# Patient Record
Sex: Female | Born: 1955 | Race: White | Hispanic: No | Marital: Married | Smoking: Current every day smoker
Health system: Southern US, Community
[De-identification: ages and names within clinical notes are randomized; demographics above are authoritative.]

## PROBLEM LIST (undated history)

## (undated) DIAGNOSIS — M542 Cervicalgia: Secondary | ICD-10-CM

## (undated) DIAGNOSIS — N189 Chronic kidney disease, unspecified: Secondary | ICD-10-CM

## (undated) DIAGNOSIS — Z91014 Allergy to mammalian meats: Secondary | ICD-10-CM

## (undated) DIAGNOSIS — K5792 Diverticulitis of intestine, part unspecified, without perforation or abscess without bleeding: Secondary | ICD-10-CM

## (undated) DIAGNOSIS — R011 Cardiac murmur, unspecified: Secondary | ICD-10-CM

## (undated) DIAGNOSIS — I10 Essential (primary) hypertension: Secondary | ICD-10-CM

## (undated) DIAGNOSIS — M545 Low back pain, unspecified: Secondary | ICD-10-CM

## (undated) DIAGNOSIS — K219 Gastro-esophageal reflux disease without esophagitis: Secondary | ICD-10-CM

## (undated) DIAGNOSIS — N200 Calculus of kidney: Secondary | ICD-10-CM

## (undated) DIAGNOSIS — E7800 Pure hypercholesterolemia, unspecified: Secondary | ICD-10-CM

## (undated) HISTORY — DX: Allergy to mammalian meats: Z91.014

## (undated) HISTORY — DX: Calculus of kidney: N20.0

## (undated) HISTORY — DX: Low back pain, unspecified: M54.50

## (undated) HISTORY — DX: Pure hypercholesterolemia, unspecified: E78.00

## (undated) HISTORY — DX: Cervicalgia: M54.2

## (undated) HISTORY — DX: Chronic kidney disease, unspecified: N18.9

## (undated) HISTORY — PX: CYSTOSCOPY: SUR368

## (undated) HISTORY — DX: Gastro-esophageal reflux disease without esophagitis: K21.9

## (undated) HISTORY — DX: Essential (primary) hypertension: I10

## (undated) HISTORY — PX: RECONSTRUCTION OF EYELID: SHX6576

## (undated) HISTORY — DX: Diverticulitis of intestine, part unspecified, without perforation or abscess without bleeding: K57.92

## (undated) HISTORY — PX: KNEE ARTHROSCOPY W/ MENISCAL REPAIR: SHX1877

## (undated) HISTORY — DX: Cardiac murmur, unspecified: R01.1

## (undated) HISTORY — DX: Low back pain: M54.5

---

## 1995-02-23 HISTORY — PX: VAGINAL HYSTERECTOMY: SUR661

## 2005-12-16 ENCOUNTER — Ambulatory Visit (HOSPITAL_BASED_OUTPATIENT_CLINIC_OR_DEPARTMENT_OTHER): Admission: RE | Admit: 2005-12-16 | Discharge: 2005-12-16 | Payer: Self-pay | Admitting: Urology

## 2011-03-08 ENCOUNTER — Other Ambulatory Visit (HOSPITAL_COMMUNITY)
Admission: RE | Admit: 2011-03-08 | Discharge: 2011-03-08 | Disposition: A | Discharge status: Home / Self Care | Payer: BC Managed Care – PPO | Source: Ambulatory Visit | Attending: Gynecology | Admitting: Gynecology

## 2011-03-08 ENCOUNTER — Encounter: Payer: Self-pay | Admitting: Gynecology

## 2011-03-08 ENCOUNTER — Ambulatory Visit (INDEPENDENT_AMBULATORY_CARE_PROVIDER_SITE_OTHER): Payer: BC Managed Care – PPO | Admitting: Gynecology

## 2011-03-08 VITALS — BP 120/88 | Ht 65.0 in | Wt 188.0 lb

## 2011-03-08 DIAGNOSIS — Z01419 Encounter for gynecological examination (general) (routine) without abnormal findings: Secondary | ICD-10-CM

## 2011-03-08 DIAGNOSIS — M62838 Other muscle spasm: Secondary | ICD-10-CM

## 2011-03-08 MED ORDER — ALPRAZOLAM 0.5 MG PO TABS: 0.5000 mg | ORAL_TABLET | Freq: Three times a day (TID) | ORAL | Status: AC | PRN

## 2011-03-08 MED ORDER — IBUPROFEN 800 MG PO TABS: 800.0000 mg | ORAL_TABLET | Freq: Three times a day (TID) | ORAL | Status: AC | PRN

## 2011-03-08 MED ORDER — CYCLOBENZAPRINE HCL 10 MG PO TABS: 10.0000 mg | ORAL_TABLET | Freq: Every evening | ORAL | Status: DC | PRN

## 2011-03-08 NOTE — Patient Instructions (Addendum)
Try Flexeril and Motrin as we discussed. If her neck pain continues I recommend seeing orthopedist for further evaluation. Assuming you continue well from a gynecologic standpoint follow up in one year.    Consider Stop Smoking.  Help is available at [facility] smoking cessation program @ www.[place].com or (903) 119-2259. OR 1-800-QUIT-NOW (479)025-7644) for free smoking cessation counseling.

## 2011-03-08 NOTE — Progress Notes (Addendum)
Whitney Ross [birth year] [id number]        56 y.o.  for annual exam.  Former patient of Dr. Angeline Slim says not been in several years due to lack of insurance. She is without gynecologic complaints. Does have occasional hot flushes. Had been on ERT previously but has discontinued this is done already off of it. 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  Past medical history,surgical history, medications, allergies, family history and social history were all reviewed and documented in the EPIC chart. ROS:  Was performed and pertinent positives and negatives are included in the history.  Exam: chaperone present Filed Vitals:   03/08/11 1426  BP: 120/88   General appearance  Normal Skin grossly normal Head/Neck normal with no cervical or supraclavicular adenopathy thyroid normal Lungs  clear Cardiac RR, without RMG Abdominal  soft, nontender, without masses, organomegaly or hernia Breasts  examined lying and sitting without masses, retractions, discharge or axillary adenopathy. Pelvic  Ext/BUS/vagina  normal Pap of cuff done  Bimanual exam normal  Anus and perineum  normal   Rectovaginal  normal sphincter tone without palpated masses or tenderness.    Assessment/Plan:  56 y.o. female for annual exam.    1. Mild menopausal symptoms. Options were reviewed and she's going to try OTC soy. I reviewed ERT with her but she's not interested she will follow up with me if her symptoms become worse and she wants to discuss ERT. 2. Neck muscle spasm. Patient's been having issues with spasms in her trapezius muscle on the left over the past several weeks. She has seen physical therapy as well as chiropractic. On exam she does have tension in her left trapezius no palpable masses but definitely more tense than her right recommended that she go with Flexeril 10 mg at bedtime #20 no refill and Motrin 800 mg every 8 hours when necessary pain #30 with one  refill. If her pain persists recommend she follow up with orthopedics to consider diagnostic studies rule out cervical disc disease and she agrees with this. 3. Anxiety. She does have some anxiety in her marriage and asked if I could prescribe Xanax to help her deal with this I prescribed Xanax 0.5 mg #30 with 1 refill. 4. Cigarette smoking. Stop smoking was discussed. 5. Pap smear. I did a Pap smear of her vaginal cuff today. She has not had one in several years and she is new to the practice I wanted to get a baseline Pap smear. I did discuss current screening guidelines that after hysterectomy for benign indications that you can stop doing Pap smears and we'll rediscuss this on an annual basis. 6. Mammography. She has not had mammogram in several years and agrees to schedule this in the importance of follow up. SBE monthly was reviewed. 7. Colonoscopy. The patient is in the process of arranging this and will do so. 8. Health maintenance. Patient being followed for hypertension and hypercholesterolemia. She'll continue to see her primary in reference to this and no blood work was done today as is all done through her primary's office.    Dara Lords MD, 3:09 PM 03/08/2011

## 2011-03-09 ENCOUNTER — Encounter: Payer: Self-pay | Admitting: Gynecology

## 2011-05-11 ENCOUNTER — Encounter: Payer: Self-pay | Admitting: Gynecology

## 2011-05-14 ENCOUNTER — Encounter: Payer: Self-pay | Admitting: Gastroenterology

## 2011-05-25 ENCOUNTER — Encounter: Payer: Self-pay | Admitting: Gastroenterology

## 2011-05-25 ENCOUNTER — Ambulatory Visit (AMBULATORY_SURGERY_CENTER): Payer: BC Managed Care – PPO | Admitting: *Deleted

## 2011-05-25 VITALS — Ht 65.0 in | Wt 184.0 lb

## 2011-05-25 DIAGNOSIS — Z1211 Encounter for screening for malignant neoplasm of colon: Secondary | ICD-10-CM

## 2011-05-25 MED ORDER — PEG-KCL-NACL-NASULF-NA ASC-C 100 G PO SOLR: ORAL | Status: DC

## 2011-05-25 NOTE — Progress Notes (Signed)
Pt knows to be here at 8:00 a.m. For her 9:00 a.m. procedure

## 2011-06-01 ENCOUNTER — Ambulatory Visit (AMBULATORY_SURGERY_CENTER): Payer: BC Managed Care – PPO | Admitting: Gastroenterology

## 2011-06-01 ENCOUNTER — Encounter: Payer: Self-pay | Admitting: Gastroenterology

## 2011-06-01 VITALS — BP 133/100 | HR 70 | Temp 98.1°F | Resp 19 | Ht 65.0 in | Wt 184.0 lb

## 2011-06-01 DIAGNOSIS — Z1211 Encounter for screening for malignant neoplasm of colon: Secondary | ICD-10-CM

## 2011-06-01 DIAGNOSIS — K648 Other hemorrhoids: Secondary | ICD-10-CM

## 2011-06-01 DIAGNOSIS — K5730 Diverticulosis of large intestine without perforation or abscess without bleeding: Secondary | ICD-10-CM

## 2011-06-01 MED ORDER — SODIUM CHLORIDE 0.9 % IV SOLN: 500.0000 mL | INTRAVENOUS | Status: DC

## 2011-06-01 NOTE — Op Note (Signed)
[facility] [address] [place], [place]  [address]  COLONOSCOPY PROCEDURE REPORT  PATIENT:  Whitney Ross, Whitney Ross  MR#:  [id number] BIRTHDATE:  12-Aug-1955, 55 yrs. old  GENDER:  female ENDOSCOPIST:  Rachael Fee, MD REF. BY:  Rudi Heap, M.D. PROCEDURE DATE:  06/01/2011 PROCEDURE:  Colonoscopy 78295 ASA CLASS:  Class II INDICATIONS:  Routine Risk Screening MEDICATIONS:   Fentanyl 75 mcg IV, These medications were titrated to patient response per physician's verbal order, Versed 9 mg IV  DESCRIPTION OF PROCEDURE:   After the risks benefits and alternatives of the procedure were thoroughly explained, informed consent was obtained.  Digital rectal exam was performed and revealed no rectal masses.   The LB [id number] endoscope was introduced through the anus and advanced to the cecum, which was identified by both the appendix and ileocecal valve, without limitations.  The quality of the prep was good..  The instrument was then slowly withdrawn as the colon was fully examined. <<PROCEDUREIMAGES>> FINDINGS:  Mild diverticulosis was found in the sigmoid to descending colon segments (see image1).  Internal Hemorrhoids were found.  This was otherwise a normal examination of the colon (see image2, image3, and image4).   Retroflexed views in the rectum revealed no abnormalities.   COMPLICATIONS:  None  ENDOSCOPIC IMPRESSION: 1) Mild diverticulosis in the sigmoid to descending colon segments 2) Internal hemorrhoids 3) Otherwise normal examination; no polyps or cancers  RECOMMENDATIONS: 1) You should continue to follow colorectal cancer screening guidelines for "routine risk" patients with a repeat colonoscopy in 10 years. There is no need for FOBT (stool) testing for at least 5 years.  REPEAT EXAM:  10 years  ______________________________ Rachael Fee, MD  n. eSIGNED:   Rachael Fee at 06/01/2011 09:13 AM  Genevie Cheshire, [phone]

## 2011-06-01 NOTE — Progress Notes (Signed)
Patient did not have preoperative order for IV antibiotic SSI prophylaxis. (G8918)  Patient did not experience any of the following events: a burn prior to discharge; a fall within the facility; wrong site/side/patient/procedure/implant event; or a hospital transfer or hospital admission upon discharge from the facility. (G8907)  

## 2011-06-01 NOTE — Patient Instructions (Addendum)
One of your biggest health concerns is your smoking.  This increases your risk for most cancers and serious cardiovascular diseases such as strokes, heart attacks.  You should try your best to stop.  If you need assistance, please contact your PCP or Smoking Cessation Class at [facility] [phone]) or Katie (1-800-QUIT-NOW).   YOU HAD AN ENDOSCOPIC PROCEDURE TODAY AT [facility]: Refer to the procedure report that was given to you for any specific questions about what was found during the examination.  If the procedure report does not answer your questions, please call your gastroenterologist to clarify.  If you requested that your care partner not be given the details of your procedure findings, then the procedure report has been included in a sealed envelope for you to review at your convenience later.  YOU SHOULD EXPECT: Some feelings of bloating in the abdomen. Passage of more gas than usual.  Walking can help get rid of the air that was put into your GI tract during the procedure and reduce the bloating. If you had a lower endoscopy (such as a colonoscopy or flexible sigmoidoscopy) you may notice spotting of blood in your stool or on the toilet paper. If you underwent a bowel prep for your procedure, then you may not have a normal bowel movement for a few days.  DIET: Your first meal following the procedure should be a light meal and then it is ok to progress to your normal diet.  A half-sandwich or bowl of soup is an example of a good first meal.  Heavy or fried foods are harder to digest and may make you feel nauseous or bloated.  Likewise meals heavy in dairy and vegetables can cause extra gas to form and this can also increase the bloating.  Drink plenty of fluids but you should avoid alcoholic beverages for 24 hours.  ACTIVITY: Your care partner should take you home directly after the procedure.  You should plan to take it easy, moving slowly for the rest of  the day.  You can resume normal activity the day after the procedure however you should NOT DRIVE or use heavy machinery for 24 hours (because of the sedation medicines used during the test).    SYMPTOMS TO REPORT IMMEDIATELY: A gastroenterologist can be reached at any hour.  During normal business hours, 8:30 AM to 5:00 PM Monday through Friday, call [phone].  After hours and on weekends, please call the GI answering service at [phone] who will take a message and have the physician on call contact you.   Following lower endoscopy (colonoscopy or flexible sigmoidoscopy):  Excessive amounts of blood in the stool  Significant tenderness or worsening of abdominal pains  Swelling of the abdomen that is new, acute  Fever of 100F or higher    FOLLOW UP: If any biopsies were taken you will be contacted by phone or by letter within the next 1-3 weeks.  Call your gastroenterologist if you have not heard about the biopsies in 3 weeks.  Our staff will call the home number listed on your records the next business day following your procedure to check on you and address any questions or concerns that you may have at that time regarding the information given to you following your procedure. This is a courtesy call and so if there is no answer at the home number and we have not heard from you through the emergency physician on call, we will assume that you  have returned to your regular daily activities without incident.  SIGNATURES/CONFIDENTIALITY: You and/or your care partner have signed paperwork which will be entered into your electronic medical record.  These signatures attest to the fact that that the information above on your After Visit Summary has been reviewed and is understood.  Full responsibility of the confidentiality of this discharge information lies with you and/or your care-partner.   INFORMATION ON DIVERTICULOSIS ,HEMORRHOIDS, & HIGH FIBER DIET GIVEN TO YOU TODAY

## 2011-06-02 ENCOUNTER — Telehealth: Payer: Self-pay

## 2011-06-02 NOTE — Telephone Encounter (Signed)
  Follow up Call-  Call back number 06/01/2011  Post procedure Call Back phone  # [phone] cell  Permission to leave phone message Yes     Patient questions:  Do you have a fever, pain , or abdominal swelling? no Pain Score  0 *  Have you tolerated food without any problems? yes  Have you been able to return to your normal activities? yes  Do you have any questions about your discharge instructions: Diet   no Medications  no Follow up visit  no  Do you have questions or concerns about your Care? no  Actions: * If pain score is 4 or above: No action needed, pain <4.

## 2011-06-08 ENCOUNTER — Other Ambulatory Visit: Payer: BC Managed Care – PPO | Admitting: Gastroenterology

## 2011-11-30 ENCOUNTER — Ambulatory Visit
Admission: RE | Admit: 2011-11-30 | Discharge: 2011-11-30 | Disposition: A | Discharge status: Home / Self Care | Payer: BC Managed Care – PPO | Source: Ambulatory Visit | Attending: Family Medicine | Admitting: Family Medicine

## 2011-11-30 ENCOUNTER — Other Ambulatory Visit: Payer: BC Managed Care – PPO

## 2011-11-30 ENCOUNTER — Other Ambulatory Visit: Payer: Self-pay | Admitting: Family Medicine

## 2011-11-30 DIAGNOSIS — N23 Unspecified renal colic: Secondary | ICD-10-CM

## 2011-11-30 DIAGNOSIS — R109 Unspecified abdominal pain: Secondary | ICD-10-CM

## 2011-12-21 ENCOUNTER — Other Ambulatory Visit: Payer: Self-pay | Admitting: Urology

## 2011-12-21 DIAGNOSIS — R109 Unspecified abdominal pain: Secondary | ICD-10-CM

## 2011-12-22 ENCOUNTER — Other Ambulatory Visit: Payer: BC Managed Care – PPO

## 2011-12-28 ENCOUNTER — Ambulatory Visit
Admission: RE | Admit: 2011-12-28 | Discharge: 2011-12-28 | Disposition: A | Discharge status: Home / Self Care | Payer: BC Managed Care – PPO | Source: Ambulatory Visit | Attending: Urology | Admitting: Urology

## 2011-12-28 DIAGNOSIS — R109 Unspecified abdominal pain: Secondary | ICD-10-CM

## 2012-01-07 ENCOUNTER — Other Ambulatory Visit: Payer: Self-pay | Admitting: Family Medicine

## 2012-01-07 DIAGNOSIS — R1011 Right upper quadrant pain: Secondary | ICD-10-CM

## 2012-01-13 ENCOUNTER — Encounter (HOSPITAL_COMMUNITY): Payer: BC Managed Care – PPO

## 2012-01-18 ENCOUNTER — Encounter (HOSPITAL_COMMUNITY)
Admission: RE | Admit: 2012-01-18 | Discharge: 2012-01-18 | Disposition: A | Discharge status: Home / Self Care | Payer: BC Managed Care – PPO | Source: Ambulatory Visit | Attending: Family Medicine | Admitting: Family Medicine

## 2012-01-18 ENCOUNTER — Encounter (HOSPITAL_COMMUNITY): Payer: Self-pay

## 2012-01-18 DIAGNOSIS — R109 Unspecified abdominal pain: Secondary | ICD-10-CM | POA: Insufficient documentation

## 2012-01-18 DIAGNOSIS — R1011 Right upper quadrant pain: Secondary | ICD-10-CM

## 2012-01-18 MED ORDER — TECHNETIUM TC 99M MEBROFENIN IV KIT
5.0000 | PACK | Freq: Once | INTRAVENOUS | Status: AC | PRN
  Administered 2012-01-18: 5.2 via INTRAVENOUS

## 2012-05-18 ENCOUNTER — Encounter: Payer: Self-pay | Admitting: Family Medicine

## 2012-05-18 ENCOUNTER — Encounter: Payer: Self-pay | Admitting: *Deleted

## 2012-05-19 ENCOUNTER — Encounter: Payer: Self-pay | Admitting: Family Medicine

## 2012-05-19 ENCOUNTER — Ambulatory Visit (INDEPENDENT_AMBULATORY_CARE_PROVIDER_SITE_OTHER): Payer: Self-pay | Admitting: Family Medicine

## 2012-05-19 VITALS — BP 151/91 | HR 67 | Temp 97.2°F | Ht 65.0 in | Wt 198.8 lb

## 2012-05-19 DIAGNOSIS — F439 Reaction to severe stress, unspecified: Secondary | ICD-10-CM | POA: Insufficient documentation

## 2012-05-19 DIAGNOSIS — J310 Chronic rhinitis: Secondary | ICD-10-CM | POA: Insufficient documentation

## 2012-05-19 DIAGNOSIS — J019 Acute sinusitis, unspecified: Secondary | ICD-10-CM | POA: Insufficient documentation

## 2012-05-19 DIAGNOSIS — Z639 Problem related to primary support group, unspecified: Secondary | ICD-10-CM

## 2012-05-19 MED ORDER — FLUTICASONE PROPIONATE 50 MCG/ACT NA SUSP: 2.0000 | Freq: Every day | NASAL | Status: DC

## 2012-05-19 MED ORDER — AMOXICILLIN 875 MG PO TABS: 875.0000 mg | ORAL_TABLET | Freq: Two times a day (BID) | ORAL | Status: DC

## 2012-05-19 MED ORDER — AZELASTINE HCL 0.15 % NA SOLN: 2.0000 | Freq: Two times a day (BID) | NASAL | Status: DC

## 2012-05-19 NOTE — Patient Instructions (Signed)
Allergic Rhinitis Allergic rhinitis is when the mucous membranes in the nose respond to allergens. Allergens are particles in the air that cause your body to have an allergic reaction. This causes you to release allergic antibodies. Through a chain of events, these eventually cause you to release histamine into the blood stream (hence the use of antihistamines). Although meant to be protective to the body, it is this release that causes your discomfort, such as frequent sneezing, congestion and an itchy runny nose.  CAUSES  The pollen allergens may come from grasses, trees, and weeds. This is seasonal allergic rhinitis, or "hay fever." Other allergens cause year-round allergic rhinitis (perennial allergic rhinitis) such as house dust mite allergen, pet dander and mold spores.  SYMPTOMS   Nasal stuffiness (congestion).  Runny, itchy nose with sneezing and tearing of the eyes.  There is often an itching of the mouth, eyes and ears. It cannot be cured, but it can be controlled with medications. DIAGNOSIS  If you are unable to determine the offending allergen, skin or blood testing may find it. TREATMENT   Avoid the allergen.  Medications and allergy shots (immunotherapy) can help.  Hay fever may often be treated with antihistamines in pill or nasal spray forms. Antihistamines block the effects of histamine. There are over-the-counter medicines that may help with nasal congestion and swelling around the eyes. Check with your caregiver before taking or giving this medicine. If the treatment above does not work, there are many new medications your caregiver can prescribe. Stronger medications may be used if initial measures are ineffective. Desensitizing injections can be used if medications and avoidance fails. Desensitization is when a patient is given ongoing shots until the body becomes less sensitive to the allergen. Make sure you follow up with your caregiver if problems continue. SEEK MEDICAL  CARE IF:   You develop fever (more than 100.5 F (38.1 C).  You develop a cough that does not stop easily (persistent).  You have shortness of breath.  You start wheezing.  Symptoms interfere with normal daily activities. Document Released: 11/03/2000 Document Revised: 05/03/2011 Document Reviewed: 05/15/2008 [facility] Patient Information 2013 [place], [place]. Smoking Cessation Quitting smoking is important to your health and has many advantages. However, it is not always easy to quit since nicotine is a very addictive drug. Often times, people try 3 times or more before being able to quit. This document explains the best ways for you to prepare to quit smoking. Quitting takes hard work and a lot of effort, but you can do it. ADVANTAGES OF QUITTING SMOKING  You will live longer, feel better, and live better.  Your body will feel the impact of quitting smoking almost immediately.  Within 20 minutes, blood pressure decreases. Your pulse returns to its normal level.  After 8 hours, carbon monoxide levels in the blood return to normal. Your oxygen level increases.  After 24 hours, the chance of having a heart attack starts to decrease. Your breath, hair, and body stop smelling like smoke.  After 48 hours, damaged nerve endings begin to recover. Your sense of taste and smell improve.  After 72 hours, the body is virtually free of nicotine. Your bronchial tubes relax and breathing becomes easier.  After 2 to 12 weeks, lungs can hold more air. Exercise becomes easier and circulation improves.  The risk of having a heart attack, stroke, cancer, or lung disease is greatly reduced.  After 1 year, the risk of coronary heart disease is cut in half.  After 5 years, the risk of stroke falls to the same as a nonsmoker.  After 10 years, the risk of lung cancer is cut in half and the risk of other cancers decreases significantly.  After 15 years, the risk of coronary heart disease drops,  usually to the level of a nonsmoker.  If you are pregnant, quitting smoking will improve your chances of having a healthy baby.  The people you live with, especially any children, will be healthier.  You will have extra money to spend on things other than cigarettes. QUESTIONS TO THINK ABOUT BEFORE ATTEMPTING TO QUIT You may want to talk about your answers with your caregiver.  Why do you want to quit?  If you tried to quit in the past, what helped and what did not?  What will be the most difficult situations for you after you quit? How will you plan to handle them?  Who can help you through the tough times? Your family? Friends? A caregiver?  What pleasures do you get from smoking? What ways can you still get pleasure if you quit? Here are some questions to ask your caregiver:  How can you help me to be successful at quitting?  What medicine do you think would be best for me and how should I take it?  What should I do if I need more help?  What is smoking withdrawal like? How can I get information on withdrawal? GET READY  Set a quit date.  Change your environment by getting rid of all cigarettes, ashtrays, matches, and lighters in your home, car, or work. Do not let people smoke in your home.  Review your past attempts to quit. Think about what worked and what did not. GET SUPPORT AND ENCOURAGEMENT You have a better chance of being successful if you have help. You can get support in many ways.  Tell your family, friends, and co-workers that you are going to quit and need their support. Ask them not to smoke around you.  Get individual, group, or telephone counseling and support. Programs are available at [employer] and health centers. Call your local health department for information about programs in your area.  Spiritual beliefs and practices may help some smokers quit.  Download a "quit meter" on your computer to keep track of quit statistics, such as how long  you have gone without smoking, cigarettes not smoked, and money saved.  Get a self-help book about quitting smoking and staying off of tobacco. LEARN NEW SKILLS AND BEHAVIORS  Distract yourself from urges to smoke. Talk to someone, go for a walk, or occupy your time with a task.  Change your normal routine. Take a different route to work. Drink tea instead of coffee. Eat breakfast in a different place.  Reduce your stress. Take a hot bath, exercise, or read a book.  Plan something enjoyable to do every day. Reward yourself for not smoking.  Explore interactive web-based programs that specialize in helping you quit. GET MEDICINE AND USE IT CORRECTLY Medicines can help you stop smoking and decrease the urge to smoke. Combining medicine with the above behavioral methods and support can greatly increase your chances of successfully quitting smoking.  Nicotine replacement therapy helps deliver nicotine to your body without the negative effects and risks of smoking. Nicotine replacement therapy includes nicotine gum, lozenges, inhalers, nasal sprays, and skin patches. Some may be available over-the-counter and others require a prescription.  Antidepressant medicine helps people abstain from smoking, but how this  works is unknown. This medicine is available by prescription.  Nicotinic receptor partial agonist medicine simulates the effect of nicotine in your brain. This medicine is available by prescription. Ask your caregiver for advice about which medicines to use and how to use them based on your health history. Your caregiver will tell you what side effects to look out for if you choose to be on a medicine or therapy. Carefully read the information on the package. Do not use any other product containing nicotine while using a nicotine replacement product.  RELAPSE OR DIFFICULT SITUATIONS Most relapses occur within the first 3 months after quitting. Do not be discouraged if you start smoking  again. Remember, most people try several times before finally quitting. You may have symptoms of withdrawal because your body is used to nicotine. You may crave cigarettes, be irritable, feel very hungry, cough often, get headaches, or have difficulty concentrating. The withdrawal symptoms are only temporary. They are strongest when you first quit, but they will go away within 10 14 days. To reduce the chances of relapse, try to:  Avoid drinking alcohol. Drinking lowers your chances of successfully quitting.  Reduce the amount of caffeine you consume. Once you quit smoking, the amount of caffeine in your body increases and can give you symptoms, such as a rapid heartbeat, sweating, and anxiety.  Avoid smokers because they can make you want to smoke.  Do not let weight gain distract you. Many smokers will gain weight when they quit, usually less than 10 pounds. Eat a healthy diet and stay active. You can always lose the weight gained after you quit.  Find ways to improve your mood other than smoking. FOR MORE INFORMATION  www.smokefree.gov  Document Released: 02/02/2001 Document Revised: 08/10/2011 Document Reviewed: 05/20/2011 [facility] Patient Information 2013 [place], [place]. Stress Stress-related medical problems are becoming increasingly common. The body has a built-in physical response to stressful situations. Faced with pressure, challenge or danger, we need to react quickly. Our bodies release hormones such as cortisol and adrenaline to help do this. These hormones are part of the "fight or flight" response and affect the metabolic rate, heart rate and blood pressure, resulting in a heightened, stressed state that prepares the body for optimum performance in dealing with a stressful situation. It is likely that early man required these mechanisms to stay alive, but usually modern stresses do not call for this, and the same hormones released in today's world can damage health and reduce  coping ability. CAUSES  Pressure to perform at work, at school or in sports.  Threats of physical violence.  Money worries.  Arguments.  Family conflicts.  Divorce or separation from significant other.  Bereavement.  New job or unemployment.  Changes in location.  Alcohol or drug abuse. SOMETIMES, THERE IS NO PARTICULAR REASON FOR DEVELOPING STRESS. Almost all people are at risk of being stressed at some time in their lives. It is important to know that some stress is temporary and some is long term.  Temporary stress will go away when a situation is resolved. Most people can cope with short periods of stress, and it can often be relieved by relaxing, taking a walk, chatting through issues with friends, or having a good night's sleep.  Chronic (long-term, continuous) stress is much harder to deal with. It can be psychologically and emotionally damaging. It can be harmful both for an individual and for friends and family. SYMPTOMS Everyone reacts to stress differently. There are some common effects that  help [place] recognize it. In times of extreme stress, people may:  Shake uncontrollably.  Breathe faster and deeper than normal (hyperventilate).  Vomit.  For people with asthma, stress can trigger an attack.  For some people, stress may trigger migraine headaches, ulcers, and body pain. PHYSICAL EFFECTS OF STRESS MAY INCLUDE:  Loss of energy.  Skin problems.  Aches and pains resulting from tense muscles, including neck ache, backache and tension headaches.  Increased pain from arthritis and other conditions.  Irregular heart beat (palpitations).  Periods of irritability or anger.  Apathy or depression.  Anxiety (feeling uptight or worrying).  Unusual behavior.  Loss of appetite.  Comfort eating.  Lack of concentration.  Loss of, or decreased, sex-drive.  Increased smoking, drinking, or recreational drug use.  For women, missed periods.  Ulcers, joint  pain, and muscle pain. Post-traumatic stress is the stress caused by any serious accident, strong emotional damage, or extremely difficult or violent experience such as rape or war. Post-traumatic stress victims can experience mixtures of emotions such as fear, shame, depression, guilt or anger. It may include recurrent memories or images that may be haunting. These feelings can last for weeks, months or even years after the traumatic event that triggered them. Specialized treatment, possibly with medicines and psychological therapies, is available. If stress is causing physical symptoms, severe distress or making it difficult for you to function as normal, it is worth seeing your caregiver. It is important to remember that although stress is a usual part of life, extreme or prolonged stress can lead to other illnesses that will need treatment. It is better to visit a doctor sooner rather than later. Stress has been linked to the development of high blood pressure and heart disease, as well as insomnia and depression. There is no diagnostic test for stress since everyone reacts to it differently. But a caregiver will be able to spot the physical symptoms, such as:  Headaches.  Shingles.  Ulcers. Emotional distress such as intense worry, low mood or irritability should be detected when the doctor asks pertinent questions to identify any underlying problems that might be the cause. In case there are physical reasons for the symptoms, the doctor may also want to do some tests to exclude certain conditions. If you feel that you are suffering from stress, try to identify the aspects of your life that are causing it. Sometimes you may not be able to change or avoid them, but even a small change can have a positive ripple effect. A simple lifestyle change can make all the difference. STRATEGIES THAT CAN HELP DEAL WITH STRESS:  Delegating or sharing responsibilities.  Avoiding confrontations.  Learning to  be more assertive.  Regular exercise.  Avoid using alcohol or street drugs to cope.  Eating a healthy, balanced diet, rich in fruit and vegetables and proteins.  Finding humor or absurdity in stressful situations.  Never taking on more than you know you can handle comfortably.  Organizing your time better to get as much done as possible.  Talking to friends or family and sharing your thoughts and fears.  Listening to music or relaxation tapes.  Tensing and then relaxing your muscles, starting at the toes and working up to the head and neck. If you think that you would benefit from help, either in identifying the things that are causing your stress or in learning techniques to help you relax, see a caregiver who is capable of helping you with this. Rather than relying on medications,  it is usually better to try and identify the things in your life that are causing stress and try to deal with them. There are many techniques of managing stress including counseling, psychotherapy, aromatherapy, yoga, and exercise. Your caregiver can help you determine what is best for you. Document Released: 05/01/2002 Document Revised: 05/03/2011 Document Reviewed: 03/28/2007 [facility] Patient Information 2013 [place], [place].

## 2012-05-19 NOTE — Progress Notes (Signed)
Patient ID: Whitney Ross, female   DOB: [birth year], 57 y.o.   MRN: [id number] SUBJECTIVE:   HPI: 57 year old woman comes in today with an exacerbation of nasal congestion. Pressure and pain in the maxillary sinus area. No fever no chills. No shortness of breath. She does smoke a pack a day. No phlegm. No hemoptysis. No wheezing. She's not on her nasal sprays. She is also under tremendous stress with her 55 year old son. Lives at home and is not working. And she believes he needs to see a therapist.  PMH/PSH:reviewed/updated in Epic SH/FH:reviewed/updated in Epic Meds:reviewed/updated in Epic Allergies:reviewed/updated in Epic Immunizations:reviewed/updated in Epic ROS: as above in HPI. Other systems are negative or stable today.  OBJECTIVE:   On examination she appeared in good health and spirits. A bit stressed.  Well developed, well nourished. Vital signs as documented. BP 151/91  Pulse 67  Temp(Src) 97.2 F (36.2 C) (Oral)  Ht 5\' 5"  (1.651 m)  Wt 198 lb 12.8 oz (90.175 kg)  BMI 33.08 kg/m2  Skin warm and dry and without overt rashes. Head & Neck without JVD. Nasal congestion with thick yellow coryza. Maxillary sinuses tender mildly. Frontal sinuses nontender the rest of the head and neck was normal Lungs clear.  Heart exam notable for regular rhythm, normal sounds and absence of murmurs, rubs or gallops. Abdomen unremarkable and without evidence of organomegaly, masses, or abdominal aortic enlargement.  Breast exam: not performed. Gyn Exam: Not performed. External Genitalia: Vagina:: Cervix: Uterus: Adnexae: R/V: Extremities nonedematous. Neurologic: oriented to time, place and person. Nonfocal exam. Patient was not suicidal or hallucinating. She was worried about her son and we discussed this today.  ASSESSMENT:  Purulent rhinitis - Plan: Azelastine HCl (ASTEPRO) 0.15 % SOLN, fluticasone (FLONASE) 50 MCG/ACT nasal spray, amoxicillin (AMOXIL) 875 MG  tablet  Sinusitis, acute - Plan: amoxicillin (AMOXIL) 875 MG tablet  Stress at home   PLAN:  Stress reduction. Recommended therapist Whitney Ross for her son.Also, [facility]. However she should check with insurance plan.. Meds ordered this encounter  Medications  . omeprazole (PRILOSEC) 40 MG capsule    Sig: Take 40 mg by mouth daily.  . Vitamin D, Ergocalciferol, (DRISDOL) 50000 UNITS CAPS    Sig: Take 50,000 Units by mouth once a week.  Marland Kitchen DISCONTD: fluticasone (FLONASE) 50 MCG/ACT nasal spray    Sig: Place 2 sprays into the nose daily.  Marland Kitchen DISCONTD: Azelastine HCl (ASTEPRO) 0.15 % SOLN    Sig: Place 1 spray into the nose 2 (two) times daily.  Marland Kitchen aspirin 81 MG chewable tablet    Sig: Chew 81 mg by mouth daily.  . Azelastine HCl (ASTEPRO) 0.15 % SOLN    Sig: Place 2 sprays into the nose 2 (two) times daily.    Dispense:  1 mL    Refill:  2  . fluticasone (FLONASE) 50 MCG/ACT nasal spray    Sig: Place 2 sprays into the nose daily.    Dispense:  1 g    Refill:  2  . amoxicillin (AMOXIL) 875 MG tablet    Sig: Take 1 tablet (875 mg total) by mouth 2 (two) times daily.    Dispense:  20 tablet    Refill:  0   No results found for this or any previous visit. Smoking cessation. Patient information in aVs. RTC as planned or prn.  Whitney Ross, M.D.

## 2012-06-21 ENCOUNTER — Other Ambulatory Visit: Payer: Self-pay | Admitting: *Deleted

## 2012-06-21 MED ORDER — LISINOPRIL 20 MG PO TABS: 20.0000 mg | ORAL_TABLET | Freq: Every day | ORAL | Status: DC

## 2012-06-21 MED ORDER — ATORVASTATIN CALCIUM 20 MG PO TABS: 20.0000 mg | ORAL_TABLET | Freq: Every day | ORAL | Status: DC

## 2012-07-26 ENCOUNTER — Encounter: Payer: Self-pay | Admitting: Gynecology

## 2012-07-28 ENCOUNTER — Encounter: Payer: Self-pay | Admitting: Family Medicine

## 2012-07-28 ENCOUNTER — Ambulatory Visit (INDEPENDENT_AMBULATORY_CARE_PROVIDER_SITE_OTHER): Payer: BC Managed Care – PPO | Admitting: Family Medicine

## 2012-07-28 VITALS — BP 137/89 | HR 54 | Temp 97.5°F | Wt 200.6 lb

## 2012-07-28 DIAGNOSIS — E785 Hyperlipidemia, unspecified: Secondary | ICD-10-CM

## 2012-07-28 DIAGNOSIS — K219 Gastro-esophageal reflux disease without esophagitis: Secondary | ICD-10-CM

## 2012-07-28 DIAGNOSIS — I10 Essential (primary) hypertension: Secondary | ICD-10-CM

## 2012-07-28 DIAGNOSIS — E559 Vitamin D deficiency, unspecified: Secondary | ICD-10-CM

## 2012-07-28 DIAGNOSIS — F17200 Nicotine dependence, unspecified, uncomplicated: Secondary | ICD-10-CM

## 2012-07-28 LAB — COMPLETE METABOLIC PANEL WITH GFR
ALT: 15 U/L (ref 0–35)
AST: 18 U/L (ref 0–37)
Albumin: 4.1 g/dL (ref 3.5–5.2)
Alkaline Phosphatase: 63 U/L (ref 39–117)
BUN: 11 mg/dL (ref 6–23)
CO2: 29 mEq/L (ref 19–32)
Calcium: 9 mg/dL (ref 8.4–10.5)
Chloride: 105 mEq/L (ref 96–112)
Creat: 0.65 mg/dL (ref 0.50–1.10)
GFR, Est African American: >89 mL/min
GFR, Est Non African American: >89 mL/min
Glucose, Bld: 90 mg/dL (ref 70–99)
Potassium: 4.4 mEq/L (ref 3.5–5.3)
Sodium: 139 mEq/L (ref 135–145)
Total Bilirubin: 0.6 mg/dL (ref 0.3–1.2)
Total Protein: 6.5 g/dL (ref 6.0–8.3)

## 2012-07-28 MED ORDER — OMEPRAZOLE 40 MG PO CPDR: 40.0000 mg | DELAYED_RELEASE_CAPSULE | Freq: Every day | ORAL | Status: DC

## 2012-07-28 MED ORDER — LISINOPRIL-HYDROCHLOROTHIAZIDE 20-12.5 MG PO TABS: 1.0000 | ORAL_TABLET | Freq: Every day | ORAL | Status: DC

## 2012-07-28 NOTE — Progress Notes (Signed)
Patient ID: Whitney Ross, female   DOB: [birth year], 57 y.o.   MRN: [id number] SUBJECTIVE: Chief Complaint  Patient presents with  . Follow-up    3 month follow up   HPI: Patient is here for follow up of hyperlipidemia/hypertension/stress/tobacco use/gerd: Stress is ongoing.  denies Headache;denies Chest Pain;denies weakness;denies Shortness of Breath and orthopnea;denies Visual changes;denies palpitations;denies cough;denies pedal edema;denies symptoms of TIA or stroke;deniesClaudication symptoms. admits to Compliance with medications; denies Problems with medications.  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  PMH/PSH: reviewed/updated in Epic  SH/FH: reviewed/updated in Epic  Allergies: reviewed/updated in Epic  Medications: reviewed/updated in Epic  Immunizations: reviewed/updated in Epic  ROS: As above in the HPI. All other systems are stable or negative.  OBJECTIVE: APPEARANCE:  Patient in no acute distress.The patient appeared well nourished and normally developed. Acyanotic. Waist: VITAL SIGNS:BP 137/89  Pulse 54  Temp(Src) 97.5 F (36.4 C) (Oral)  Wt 200 lb 9.6 oz (90.992 kg)  BMI 33.38 kg/m2 [name]  SKIN: warm and  Dry without overt rashes, tattoos and scars  HEAD and Neck: without JVD, Head and scalp: normal Eyes:No scleral icterus. Fundi normal, eye movements normal. Ears: Auricle normal, canal normal, Tympanic membranes normal, insufflation normal. Nose: normal Throat: normal Neck & thyroid: normal  CHEST & LUNGS: Chest wall: normal Lungs: Clear  CVS: Reveals the PMI to be normally located. Regular rhythm, First and Second Heart sounds are normal,  absence of murmurs, rubs or gallops. Peripheral vasculature: Radial pulses: normal Dorsal pedis pulses: normal Posterior pulses: normal  ABDOMEN:  Appearance: normal Benign, no organomegaly, no masses, no  Abdominal Aortic enlargement. No Guarding , no rebound. No Bruits. Bowel sounds: normal  RECTAL: N/A GU: N/A  EXTREMETIES: nonedematous. Both Femoral and Pedal pulses are normal.  MUSCULOSKELETAL:  Spine: normal Joints: intact  NEUROLOGIC: oriented to time,place and person; nonfocal. Strength is normal Sensory is normal Reflexes are normal Cranial Nerves are normal.  ASSESSMENT: HTN (hypertension) - Plan: lisinopril-hydrochlorothiazide (ZESTORETIC) 20-12.5 MG per tablet, COMPLETE METABOLIC PANEL WITH GFR  HLD (hyperlipidemia) - Plan: COMPLETE METABOLIC PANEL WITH GFR, NMR Lipoprofile with Lipids  GERD (gastroesophageal reflux disease) - Plan: omeprazole (PRILOSEC) 40 MG capsule  Unspecified vitamin D deficiency - Plan: Vitamin D 25 hydroxy  Tobacco use disorder  PLAN: Orders Placed This Encounter  Procedures  . COMPLETE METABOLIC PANEL WITH GFR  . NMR Lipoprofile with Lipids  . Vitamin D 25 hydroxy   No results found for this or any previous visit. Meds ordered this encounter  Medications  . lisinopril-hydrochlorothiazide (ZESTORETIC) 20-12.5 MG per tablet    Sig: Take 1 tablet by mouth daily.    Dispense:  90 tablet    Refill:  3  . omeprazole (PRILOSEC) 40 MG capsule    Sig: Take 1 capsule (40 mg total) by mouth daily.    Dispense:  30 capsule    Refill:  5        Dr Woodroe Mode Recommendations  Diet and Exercise discussed with patient.  For nutrition information, I recommend books:  1).Eat to Live by Dr Monico Hoar. 2).Prevent and Reverse Heart Disease by Dr Suzzette Righter. 3) Dr Katherina Right Book: Reversing Diabetes  Exercise recommendations are:  If unable to walk, then the patient can exercise in a chair 3 times a day. By flapping arms like a bird gently and raising legs outwards to the front.  If  ambulatory, the patient can go for walks for 30 minutes 3 times a week. Then increase the intensity and duration as tolerated.  Goal is to  try to attain exercise frequency to 5 times a week.  If applicable: Best to perform resistance exercises (machines or weights) 2 days a week and cardio type exercises 3 days per week.  Handout on smoking cessation given in the AVS.  counselled on smoking cessation.  Return in about 3 months (around 10/28/2012) for Recheck medical problems.  Liliann File P. [place] Charon, M.D.

## 2012-07-28 NOTE — Patient Instructions (Addendum)
Dr Woodroe Mode Recommendations  Diet and Exercise discussed with patient.  For nutrition information, I recommend books:  1).Eat to Live by Dr Monico Hoar. 2).Prevent and Reverse Heart Disease by Dr Suzzette Righter. 3) Dr Katherina Right Book: Reversing Diabetes  Exercise recommendations are:  If unable to walk, then the patient can exercise in a chair 3 times a day. By flapping arms like a bird gently and raising legs outwards to the front.  If ambulatory, the patient can go for walks for 30 minutes 3 times a week. Then increase the intensity and duration as tolerated.  Goal is to try to attain exercise frequency to 5 times a week.  If applicable: Best to perform resistance exercises (machines or weights) 2 days a week and cardio type exercises 3 days per week.   Smoking Cessation Quitting smoking is important to your health and has many advantages. However, it is not always easy to quit since nicotine is a very addictive drug. Often times, people try 3 times or more before being able to quit. This document explains the best ways for you to prepare to quit smoking. Quitting takes hard work and a lot of effort, but you can do it. ADVANTAGES OF QUITTING SMOKING  You will live longer, feel better, and live better.  Your body will feel the impact of quitting smoking almost immediately.  Within 20 minutes, blood pressure decreases. Your pulse returns to its normal level.  After 8 hours, carbon monoxide levels in the blood return to normal. Your oxygen level increases.  After 24 hours, the chance of having a heart attack starts to decrease. Your breath, hair, and body stop smelling like smoke.  After 48 hours, damaged nerve endings begin to recover. Your sense of taste and smell improve.  After 72 hours, the body is virtually free of nicotine. Your bronchial tubes relax and breathing becomes easier.  After 2 to 12 weeks, lungs can hold more air. Exercise becomes easier  and circulation improves.  The risk of having a heart attack, stroke, cancer, or lung disease is greatly reduced.  After 1 year, the risk of coronary heart disease is cut in half.  After 5 years, the risk of stroke falls to the same as a nonsmoker.  After 10 years, the risk of lung cancer is cut in half and the risk of other cancers decreases significantly.  After 15 years, the risk of coronary heart disease drops, usually to the level of a nonsmoker.  If you are pregnant, quitting smoking will improve your chances of having a healthy baby.  The people you live with, especially any children, will be healthier.  You will have extra money to spend on things other than cigarettes. QUESTIONS TO THINK ABOUT BEFORE ATTEMPTING TO QUIT You may want to talk about your answers with your caregiver.  Why do you want to quit?  If you tried to quit in the past, what helped and what did not?  What will be the most difficult situations for you after you quit? How will you plan to handle them?  Who can help you through the tough times? Your family? Friends? A caregiver?  What pleasures do you get from smoking? What ways can you still get pleasure if you quit? Here are some questions to ask your caregiver:  How can you help me to be successful at quitting?  What medicine do you think would be best for me and how should I take it?  What should I do if I need more help?  What is smoking withdrawal like? How can I get information on withdrawal? GET READY  Set a quit date.  Change your environment by getting rid of all cigarettes, ashtrays, matches, and lighters in your home, car, or work. Do not let people smoke in your home.  Review your past attempts to quit. Think about what worked and what did not. GET SUPPORT AND ENCOURAGEMENT You have a better chance of being successful if you have help. You can get support in many ways.  Tell your family, friends, and co-workers that you are going  to quit and need their support. Ask them not to smoke around you.  Get individual, group, or telephone counseling and support. Programs are available at [employer] and health centers. Call your local health department for information about programs in your area.  Spiritual beliefs and practices may help some smokers quit.  Download a "quit meter" on your computer to keep track of quit statistics, such as how long you have gone without smoking, cigarettes not smoked, and money saved.  Get a self-help book about quitting smoking and staying off of tobacco. LEARN NEW SKILLS AND BEHAVIORS  Distract yourself from urges to smoke. Talk to someone, go for a walk, or occupy your time with a task.  Change your normal routine. Take a different route to work. Drink tea instead of coffee. Eat breakfast in a different place.  Reduce your stress. Take a hot bath, exercise, or read a book.  Plan something enjoyable to do every day. Reward yourself for not smoking.  Explore interactive web-based programs that specialize in helping you quit. GET MEDICINE AND USE IT CORRECTLY Medicines can help you stop smoking and decrease the urge to smoke. Combining medicine with the above behavioral methods and support can greatly increase your chances of successfully quitting smoking.  Nicotine replacement therapy helps deliver nicotine to your body without the negative effects and risks of smoking. Nicotine replacement therapy includes nicotine gum, lozenges, inhalers, nasal sprays, and skin patches. Some may be available over-the-counter and others require a prescription.  Antidepressant medicine helps people abstain from smoking, but how this works is unknown. This medicine is available by prescription.  Nicotinic receptor partial agonist medicine simulates the effect of nicotine in your brain. This medicine is available by prescription. Ask your caregiver for advice about which medicines to use and how to use  them based on your health history. Your caregiver will tell you what side effects to look out for if you choose to be on a medicine or therapy. Carefully read the information on the package. Do not use any other product containing nicotine while using a nicotine replacement product.  RELAPSE OR DIFFICULT SITUATIONS Most relapses occur within the first 3 months after quitting. Do not be discouraged if you start smoking again. Remember, most people try several times before finally quitting. You may have symptoms of withdrawal because your body is used to nicotine. You may crave cigarettes, be irritable, feel very hungry, cough often, get headaches, or have difficulty concentrating. The withdrawal symptoms are only temporary. They are strongest when you first quit, but they will go away within 10 14 days. To reduce the chances of relapse, try to:  Avoid drinking alcohol. Drinking lowers your chances of successfully quitting.  Reduce the amount of caffeine you consume. Once you quit smoking, the amount of caffeine in your body increases and can give you symptoms, such as a  rapid heartbeat, sweating, and anxiety.  Avoid smokers because they can make you want to smoke.  Do not let weight gain distract you. Many smokers will gain weight when they quit, usually less than 10 pounds. Eat a healthy diet and stay active. You can always lose the weight gained after you quit.  Find ways to improve your mood other than smoking. FOR MORE INFORMATION  www.smokefree.gov  Document Released: 02/02/2001 Document Revised: 08/10/2011 Document Reviewed: 05/20/2011 [facility] Patient Information 2014 [place], [place].

## 2012-07-29 ENCOUNTER — Encounter: Payer: Self-pay | Admitting: Family Medicine

## 2012-07-29 DIAGNOSIS — K219 Gastro-esophageal reflux disease without esophagitis: Secondary | ICD-10-CM | POA: Insufficient documentation

## 2012-07-29 DIAGNOSIS — F17200 Nicotine dependence, unspecified, uncomplicated: Secondary | ICD-10-CM | POA: Insufficient documentation

## 2012-07-29 DIAGNOSIS — E785 Hyperlipidemia, unspecified: Secondary | ICD-10-CM | POA: Insufficient documentation

## 2012-07-29 DIAGNOSIS — E559 Vitamin D deficiency, unspecified: Secondary | ICD-10-CM | POA: Insufficient documentation

## 2012-07-29 DIAGNOSIS — I10 Essential (primary) hypertension: Secondary | ICD-10-CM | POA: Insufficient documentation

## 2012-07-29 LAB — VITAMIN D 25 HYDROXY (VIT D DEFICIENCY, FRACTURES): Vit D, 25-Hydroxy: 33 ng/mL (ref 30–89)

## 2012-07-31 LAB — NMR LIPOPROFILE WITH LIPIDS
Cholesterol, Total: 149 mg/dL (ref <200)
HDL Particle Number: 36.3 umol/L (ref >=30.5)
HDL Size: 9.5 nm (ref >=9.2)
HDL-C: 55 mg/dL (ref >=40)
LDL (calc): 84 mg/dL (ref <100)
LDL Particle Number: 1000 nmol/L — ABNORMAL HIGH (ref <1000)
LDL Size: 20.9 nm (ref >20.5)
LP-IR Score: <25 (ref <=45)
Large HDL-P: 7.7 umol/L (ref >=4.8)
Large VLDL-P: 1 nmol/L (ref <=2.7)
Small LDL Particle Number: 334 nmol/L (ref <=527)
Triglycerides: 52 mg/dL (ref <150)
VLDL Size: 39.6 nm (ref <=46.6)

## 2012-08-01 NOTE — Progress Notes (Signed)
Quick Note:  Lab result close to goal. No change in Medications for now.diet and exercise and weight loss, and stop smoking, should take care of it. No Change in plans and follow up. ______

## 2012-11-03 ENCOUNTER — Ambulatory Visit (INDEPENDENT_AMBULATORY_CARE_PROVIDER_SITE_OTHER): Payer: BC Managed Care – PPO | Admitting: Family Medicine

## 2012-11-03 ENCOUNTER — Encounter: Payer: Self-pay | Admitting: Family Medicine

## 2012-11-03 VITALS — BP 123/78 | HR 66 | Temp 97.4°F | Ht 65.0 in | Wt 200.0 lb

## 2012-11-03 DIAGNOSIS — E785 Hyperlipidemia, unspecified: Secondary | ICD-10-CM

## 2012-11-03 DIAGNOSIS — K219 Gastro-esophageal reflux disease without esophagitis: Secondary | ICD-10-CM

## 2012-11-03 DIAGNOSIS — I10 Essential (primary) hypertension: Secondary | ICD-10-CM

## 2012-11-03 DIAGNOSIS — F17200 Nicotine dependence, unspecified, uncomplicated: Secondary | ICD-10-CM

## 2012-11-03 DIAGNOSIS — Z639 Problem related to primary support group, unspecified: Secondary | ICD-10-CM

## 2012-11-03 DIAGNOSIS — E559 Vitamin D deficiency, unspecified: Secondary | ICD-10-CM

## 2012-11-03 DIAGNOSIS — M19019 Primary osteoarthritis, unspecified shoulder: Secondary | ICD-10-CM

## 2012-11-03 DIAGNOSIS — F439 Reaction to severe stress, unspecified: Secondary | ICD-10-CM

## 2012-11-03 DIAGNOSIS — M19011 Primary osteoarthritis, right shoulder: Secondary | ICD-10-CM | POA: Insufficient documentation

## 2012-11-03 DIAGNOSIS — X503XXA Overexertion from repetitive movements, initial encounter: Secondary | ICD-10-CM | POA: Insufficient documentation

## 2012-11-03 NOTE — Progress Notes (Signed)
Patient ID: Whitney Ross, female   DOB: 10/20/1955, 57 y.o.   MRN: [id number] SUBJECTIVE: CC: Chief Complaint  Patient presents with  . Follow-up    3 MONTH FOLLOW UP   refills     HPI: Patient is here for follow up of hyperlipidemia: denies Headache;denies Chest Pain;denies weakness;denies Shortness of Breath and orthopnea;denies Visual changes;denies palpitations;denies cough;denies pedal edema;denies symptoms of TIA or stroke;deniesClaudication symptoms. admits to Compliance with medications; denies Problems with medications.  Right thumb cramps at times. 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  Right AC joint hurts  Sometimes on and off. In the right shoulder.  Past Medical History  Diagnosis Date  . Hypertension   . Elevated cholesterol   . Neck pain   . Chronic kidney disease     hx muscle stones  . Lumbago   . Nephrolithiasis    Past Surgical History  Procedure Laterality Date  . Vaginal hysterectomy  1997    TOTAL VAGINAL  . Cesarean section  1987  . Cystoscopy     History   Social History  . Marital Status: Married    Spouse Name: N/A    Number of Children: N/A  . Years of Education: N/A   Occupational History  . Not on file.   Social History Main Topics  . Smoking status: Current Every Day Smoker -- 1.00 packs/day  . Smokeless tobacco: Never Used  . Alcohol Use: No     Comment: RARELY  . Drug Use: No  . Sexual Activity: Not Currently   Other Topics Concern  . Not on file   Social History Narrative  . No narrative on file   Family History  Problem Relation Age of Onset  . Heart failure Mother   . Cancer Father     MELENOMA  . Hypertension Sister   . Hyperlipidemia Sister   . Hyperlipidemia Brother   . Hypertension Brother   . Colon cancer Neg Hx   . Esophageal cancer Neg Hx   . Stomach cancer Neg Hx   . Rectal cancer Neg Hx    Current Outpatient Prescriptions on File Prior to Visit  Medication Sig Dispense Refill   . aspirin 81 MG chewable tablet Chew 81 mg by mouth daily.      Marland Kitchen atorvastatin (LIPITOR) 20 MG tablet Take 1 tablet (20 mg total) by mouth daily.  90 tablet  0  . lisinopril-hydrochlorothiazide (ZESTORETIC) 20-12.5 MG per tablet Take 1 tablet by mouth daily.  90 tablet  3  . omeprazole (PRILOSEC) 40 MG capsule Take 1 capsule (40 mg total) by mouth daily.  30 capsule  5  . Azelastine HCl (ASTEPRO) 0.15 % SOLN Place 2 sprays into the nose 2 (two) times daily.  1 mL  2  . fluticasone (FLONASE) 50 MCG/ACT nasal spray Place 2 sprays into the nose daily.  1 g  2   No current facility-administered medications on file prior to visit.   Allergies  Allergen Reactions  . Cortisporin [Bacitra-Neomycin-Polymyxin-Hc]     There is no immunization history on file for this patient. Prior to Admission medications   Medication Sig Start Date End Date Taking? Authorizing Provider  amoxicillin (AMOXIL) 500 MG capsule Take 500 mg by mouth 3 (three) times daily. Dr Melvyn Neth   Yes Historical Provider, MD  aspirin 81 MG chewable tablet Chew 81 mg by mouth daily.   Yes Historical Provider, MD  atorvastatin (LIPITOR) 20 MG tablet Take 1 tablet (  20 mg total) by mouth daily. 06/21/12  Yes Ileana Ladd, MD  lisinopril-hydrochlorothiazide (ZESTORETIC) 20-12.5 MG per tablet Take 1 tablet by mouth daily. 07/28/12  Yes Ileana Ladd, MD  omeprazole (PRILOSEC) 40 MG capsule Take 1 capsule (40 mg total) by mouth daily. 07/28/12  Yes Ileana Ladd, MD  Azelastine HCl (ASTEPRO) 0.15 % SOLN Place 2 sprays into the nose 2 (two) times daily. 05/19/12   Ileana Ladd, MD  fluticasone (FLONASE) 50 MCG/ACT nasal spray Place 2 sprays into the nose daily. 05/19/12   Ileana Ladd, MD     ROS: As above in the HPI. All other systems are stable or negative.  OBJECTIVE: APPEARANCE:  Patient in no acute distress.The patient appeared well nourished and normally developed. Acyanotic. Waist: VITAL SIGNS:BP 123/78  Pulse 66  Temp(Src)  97.4 F (36.3 C) (Oral)  Ht 5\' 5"  (1.651 m)  Wt 200 lb (90.719 kg)  BMI 33.28 kg/m2  WF  SKIN: warm and  Dry without overt rashes, tattoos and scars  HEAD and Neck: without JVD, Head and scalp: normal Eyes:No scleral icterus. Fundi normal, eye movements normal. Ears: Auricle normal, canal normal, Tympanic membranes normal, insufflation normal. Nose: normal Throat: normal Neck & thyroid: normal  CHEST & LUNGS: Chest wall: normal Lungs: Clear  CVS: Reveals the PMI to be normally located. Regular rhythm, First and Second Heart sounds are normal,  absence of murmurs, rubs or gallops. Peripheral vasculature: Radial pulses: normal Dorsal pedis pulses: normal Posterior pulses: normal  ABDOMEN:  Appearance: normal Benign, no organomegaly, no masses, no Abdominal Aortic enlargement. No Guarding , no rebound. No Bruits. Bowel sounds: normal  RECTAL: N/A GU: N/A  EXTREMETIES: nonedematous.  MUSCULOSKELETAL:  Spine: normal Joints: intact. Right AC joint mildly tender Right wrist phalen's and Tinnel's is negative. Rest of the M/S exam normal.   NEUROLOGIC: oriented to time,place and person; nonfocal. Strength is normal Sensory is normal Reflexes are normal Cranial Nerves are normal.  Results for orders placed in visit on 07/28/12  COMPLETE METABOLIC PANEL WITH GFR      Result Value Range   Sodium 139  135 - 145 mEq/L   Potassium 4.4  3.5 - 5.3 mEq/L   Chloride 105  96 - 112 mEq/L   CO2 29  19 - 32 mEq/L   Glucose, Bld 90  70 - 99 mg/dL   BUN 11  6 - 23 mg/dL   Creat 0.86  5.78 - 4.69 mg/dL   Total Bilirubin 0.6  0.3 - 1.2 mg/dL   Alkaline Phosphatase 63  39 - 117 U/L   AST 18  0 - 37 U/L   ALT 15  0 - 35 U/L   Total Protein 6.5  6.0 - 8.3 g/dL   Albumin 4.1  3.5 - 5.2 g/dL   Calcium 9.0  8.4 - 62.9 mg/dL   GFR, Est African American >89     GFR, Est Non African American >89    NMR LIPOPROFILE WITH LIPIDS      Result Value Range   LDL Particle Number 1000  (*) <1000 nmol/L   LDL (calc) 84  <100 mg/dL   HDL-C 55  >=52 mg/dL   Triglycerides 52  <841 mg/dL   Cholesterol, Total 324  <200 mg/dL   HDL Particle Number 40.1  >=02.7 umol/L   Large HDL-P 7.7  >=4.8 umol/L   Large VLDL-P 1.0  <=2.7 nmol/L   Small LDL Particle Number 334  <=527 nmol/L  LDL Size 20.9  >20.5 nm   HDL Size 9.5  >=9.2 nm   VLDL Size 39.6  <=46.6 nm   LP-IR Score <25  <=45  VITAMIN D 25 HYDROXY      Result Value Range   Vit D, 25-Hydroxy 33  30 - 89 ng/mL    ASSESSMENT: HLD (hyperlipidemia) - Plan: CMP14+EGFR, NMR, lipoprofile  HTN (hypertension) - Plan: CMP14+EGFR  GERD (gastroesophageal reflux disease)  Tobacco use disorder  Unspecified vitamin D deficiency - Plan: Vit D  25 hydroxy (rtn osteoporosis monitoring)  Stress at home - 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  Arthritis of right shoulder region - right AC joint of the right shoulder.  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 CMP14+EGFR  . NMR, lipoprofile  . Vit D  25 hydroxy (rtn osteoporosis monitoring)         Dr Woodroe Mode Recommendations  For nutrition information, I recommend books:  1).Eat to Live by Dr Monico Hoar. 2).Prevent and Reverse Heart Disease by Dr Suzzette Righter. 3) Dr Katherina Right Book:  Program to Reverse Diabetes  Exercise recommendations are:  If unable to walk, then the patient can exercise in a chair 3 times a day. By flapping arms like a bird gently and raising legs outwards to the front.  If ambulatory, the patient can go for walks for 30 minutes 3 times a week. Then increase the intensity and duration as tolerated.  Goal is to try to attain exercise frequency to 5 times a week.  If applicable: Best to perform resistance exercises (machines or weights) 2 days a week and cardio type exercises 3 days per week.  Smoking cessation counseled. Handout in the  AVS  Stress reduction.  Same meds. Should be on Vitamin D 4000 units daily.  Time for herself. Balanced lifestyle, stress reduction discussed.  Await labs. Use a splint for the right wrist and thumb. Alternate hands.  Return in about 4 months (around 03/05/2013) for Recheck medical problems.  Osualdo Hansell P. [place] Charon, M.D.

## 2012-11-03 NOTE — Patient Instructions (Signed)
    Dr Zaheer Wageman's Recommendations  For nutrition information, I recommend books:  1).Eat to Live by Dr Joel Fuhrman. 2).Prevent and Reverse Heart Disease by Dr Caldwell Esselstyn. 3) Dr Neal Barnard's Book:  Program to Reverse Diabetes  Exercise recommendations are:  If unable to walk, then the patient can exercise in a chair 3 times a day. By flapping arms like a bird gently and raising legs outwards to the front.  If ambulatory, the patient can go for walks for 30 minutes 3 times a week. Then increase the intensity and duration as tolerated.  Goal is to try to attain exercise frequency to 5 times a week.  If applicable: Best to perform resistance exercises (machines or weights) 2 days a week and cardio type exercises 3 days per week.   Smoking Cessation Quitting smoking is important to your health and has many advantages. However, it is not always easy to quit since nicotine is a very addictive drug. Often times, people try 3 times or more before being able to quit. This document explains the best ways for you to prepare to quit smoking. Quitting takes hard work and a lot of effort, but you can do it. ADVANTAGES OF QUITTING SMOKING  You will live longer, feel better, and live better.  Your body will feel the impact of quitting smoking almost immediately.  Within 20 minutes, blood pressure decreases. Your pulse returns to its normal level.  After 8 hours, carbon monoxide levels in the blood return to normal. Your oxygen level increases.  After 24 hours, the chance of having a heart attack starts to decrease. Your breath, hair, and body stop smelling like smoke.  After 48 hours, damaged nerve endings begin to recover. Your sense of taste and smell improve.  After 72 hours, the body is virtually free of nicotine. Your bronchial tubes relax and breathing becomes easier.  After 2 to 12 weeks, lungs can hold more air. Exercise becomes easier and circulation improves.  The risk  of having a heart attack, stroke, cancer, or lung disease is greatly reduced.  After 1 year, the risk of coronary heart disease is cut in half.  After 5 years, the risk of stroke falls to the same as a nonsmoker.  After 10 years, the risk of lung cancer is cut in half and the risk of other cancers decreases significantly.  After 15 years, the risk of coronary heart disease drops, usually to the level of a nonsmoker.  If you are pregnant, quitting smoking will improve your chances of having a healthy baby.  The people you live with, especially any children, will be healthier.  You will have extra money to spend on things other than cigarettes. QUESTIONS TO THINK ABOUT BEFORE ATTEMPTING TO QUIT You may want to talk about your answers with your caregiver.  Why do you want to quit?  If you tried to quit in the past, what helped and what did not?  What will be the most difficult situations for you after you quit? How will you plan to handle them?  Who can help you through the tough times? Your family? Friends? A caregiver?  What pleasures do you get from smoking? What ways can you still get pleasure if you quit? Here are some questions to ask your caregiver:  How can you help me to be successful at quitting?  What medicine do you think would be best for me and how should I take it?  What should I   do if I need more help?  What is smoking withdrawal like? How can I get information on withdrawal? GET READY  Set a quit date.  Change your environment by getting rid of all cigarettes, ashtrays, matches, and lighters in your home, car, or work. Do not let people smoke in your home.  Review your past attempts to quit. Think about what worked and what did not. GET SUPPORT AND ENCOURAGEMENT You have a better chance of being successful if you have help. You can get support in many ways.  Tell your family, friends, and co-workers that you are going to quit and need their support. Ask  them not to smoke around you.  Get individual, group, or telephone counseling and support. Programs are available at local hospitals and health centers. Call your local health department for information about programs in your area.  Spiritual beliefs and practices may help some smokers quit.  Download a "quit meter" on your computer to keep track of quit statistics, such as how long you have gone without smoking, cigarettes not smoked, and money saved.  Get a self-help book about quitting smoking and staying off of tobacco. LEARN NEW SKILLS AND BEHAVIORS  Distract yourself from urges to smoke. Talk to someone, go for a walk, or occupy your time with a task.  Change your normal routine. Take a different route to work. Drink tea instead of coffee. Eat breakfast in a different place.  Reduce your stress. Take a hot bath, exercise, or read a book.  Plan something enjoyable to do every day. Reward yourself for not smoking.  Explore interactive web-based programs that specialize in helping you quit. GET MEDICINE AND USE IT CORRECTLY Medicines can help you stop smoking and decrease the urge to smoke. Combining medicine with the above behavioral methods and support can greatly increase your chances of successfully quitting smoking.  Nicotine replacement therapy helps deliver nicotine to your body without the negative effects and risks of smoking. Nicotine replacement therapy includes nicotine gum, lozenges, inhalers, nasal sprays, and skin patches. Some may be available over-the-counter and others require a prescription.  Antidepressant medicine helps people abstain from smoking, but how this works is unknown. This medicine is available by prescription.  Nicotinic receptor partial agonist medicine simulates the effect of nicotine in your brain. This medicine is available by prescription. Ask your caregiver for advice about which medicines to use and how to use them based on your health history.  Your caregiver will tell you what side effects to look out for if you choose to be on a medicine or therapy. Carefully read the information on the package. Do not use any other product containing nicotine while using a nicotine replacement product.  RELAPSE OR DIFFICULT SITUATIONS Most relapses occur within the first 3 months after quitting. Do not be discouraged if you start smoking again. Remember, most people try several times before finally quitting. You may have symptoms of withdrawal because your body is used to nicotine. You may crave cigarettes, be irritable, feel very hungry, cough often, get headaches, or have difficulty concentrating. The withdrawal symptoms are only temporary. They are strongest when you first quit, but they will go away within 10 14 days. To reduce the chances of relapse, try to:  Avoid drinking alcohol. Drinking lowers your chances of successfully quitting.  Reduce the amount of caffeine you consume. Once you quit smoking, the amount of caffeine in your body increases and can give you symptoms, such as a rapid heartbeat, sweating,   and anxiety.  Avoid smokers because they can make you want to smoke.  Do not let weight gain distract you. Many smokers will gain weight when they quit, usually less than 10 pounds. Eat a healthy diet and stay active. You can always lose the weight gained after you quit.  Find ways to improve your mood other than smoking. FOR MORE INFORMATION  www.smokefree.gov  Document Released: 02/02/2001 Document Revised: 08/10/2011 Document Reviewed: 05/20/2011 [employer] Patient Information 2014 [employer].  

## 2012-11-06 ENCOUNTER — Encounter: Payer: Self-pay | Admitting: *Deleted

## 2012-11-06 ENCOUNTER — Other Ambulatory Visit: Payer: Self-pay | Admitting: Family Medicine

## 2012-11-06 DIAGNOSIS — E785 Hyperlipidemia, unspecified: Secondary | ICD-10-CM

## 2012-11-06 LAB — CMP14+EGFR
ALT: 30 IU/L (ref 0–32)
AST: 27 IU/L (ref 0–40)
Albumin/Globulin Ratio: 2 (ref 1.1–2.5)
Albumin: 4.5 g/dL (ref 3.5–5.5)
Alkaline Phosphatase: 66 IU/L (ref 39–117)
BUN/Creatinine Ratio: 17 (ref 9–23)
BUN: 12 mg/dL (ref 6–24)
CO2: 25 mmol/L (ref 18–29)
Calcium: 9 mg/dL (ref 8.7–10.2)
Chloride: 98 mmol/L (ref 97–108)
Creatinine, Ser: 0.71 mg/dL (ref 0.57–1.00)
GFR calc Af Amer: 111 mL/min/{1.73_m2} (ref >59)
GFR calc non Af Amer: 96 mL/min/{1.73_m2} (ref >59)
Globulin, Total: 2.2 g/dL (ref 1.5–4.5)
Glucose: 91 mg/dL (ref 65–99)
Potassium: 4.3 mmol/L (ref 3.5–5.2)
Sodium: 140 mmol/L (ref 134–144)
Total Bilirubin: 0.4 mg/dL (ref 0.0–1.2)
Total Protein: 6.7 g/dL (ref 6.0–8.5)

## 2012-11-06 LAB — VITAMIN D 25 HYDROXY (VIT D DEFICIENCY, FRACTURES): Vit D, 25-Hydroxy: 24.2 ng/mL — ABNORMAL LOW (ref 30.0–100.0)

## 2012-11-06 LAB — NMR, LIPOPROFILE
Cholesterol: 171 mg/dL (ref <200)
HDL Cholesterol by NMR: 59 mg/dL (ref >=40)
HDL Particle Number: 37.5 umol/L (ref >=30.5)
LDL Particle Number: 1372 nmol/L — ABNORMAL HIGH (ref <1000)
LDL Size: 20.5 nm — ABNORMAL LOW (ref >20.5)
LDLC SERPL CALC-MCNC: 99 mg/dL (ref <100)
LP-IR Score: 35 (ref <=45)
Small LDL Particle Number: 882 nmol/L — ABNORMAL HIGH (ref <=527)
Triglycerides by NMR: 66 mg/dL (ref <150)

## 2012-11-06 MED ORDER — ATORVASTATIN CALCIUM 40 MG PO TABS: 40.0000 mg | ORAL_TABLET | Freq: Every day | ORAL | Status: DC

## 2012-11-07 ENCOUNTER — Encounter: Payer: Self-pay | Admitting: *Deleted

## 2012-12-05 IMAGING — CT CT ABD-PELV W/O CM
2 of 4 series · 17 of 46 positions shown, 19 images · non-contrast
Comparison: 11/29/2005

CLINICAL DATA: Right flank pain, hematuria.

CT ABDOMEN AND PELVIS WITHOUT CONTRAST
TECHNIQUE: Multidetector CT imaging of the abdomen and pelvis was
performed following the standard protocol without intravenous
contrast.

[Series 2: renal stone w/o · axial · non-contrast · 0.90mm/px · z∈[-355,+30]mm · 14 of 85 slices shown, 16 images]
[im 4/85  soft-tissue]
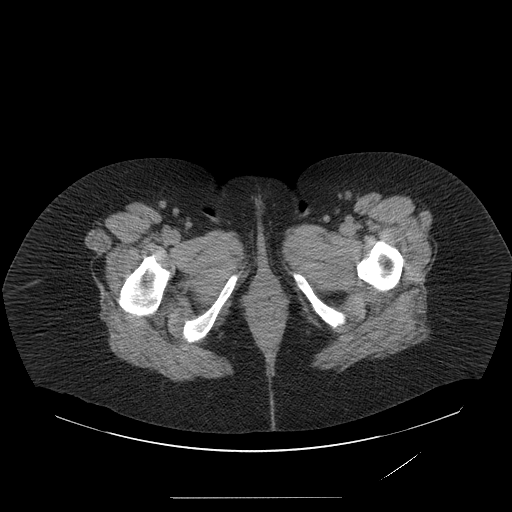
[im 4/85  bone]
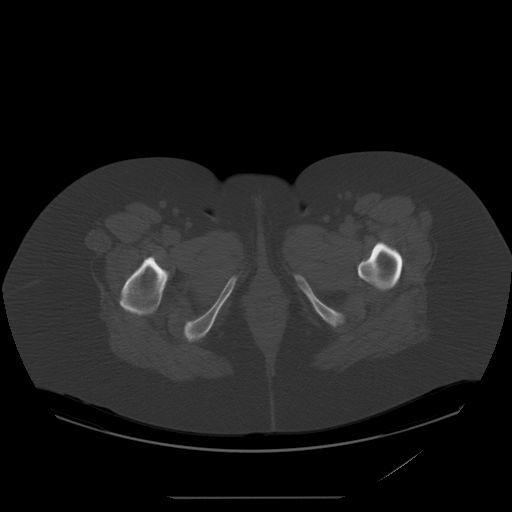
[im 11/85  soft-tissue]
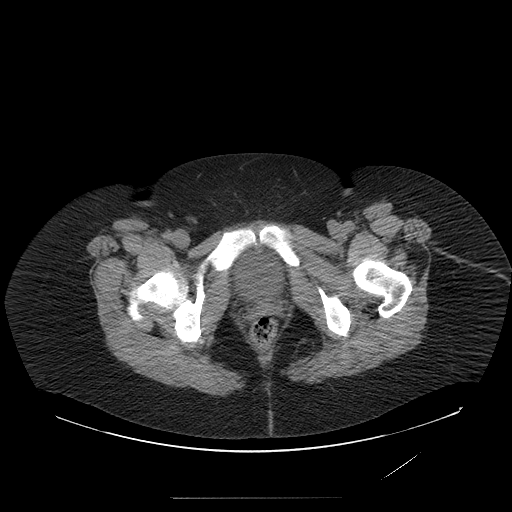
[im 18/85  soft-tissue]
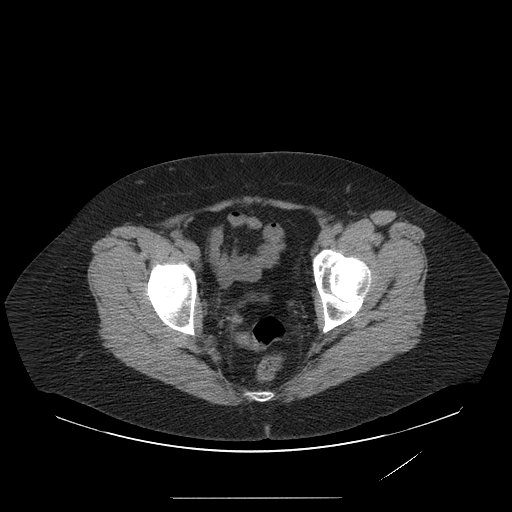
[im 22/85  soft-tissue]
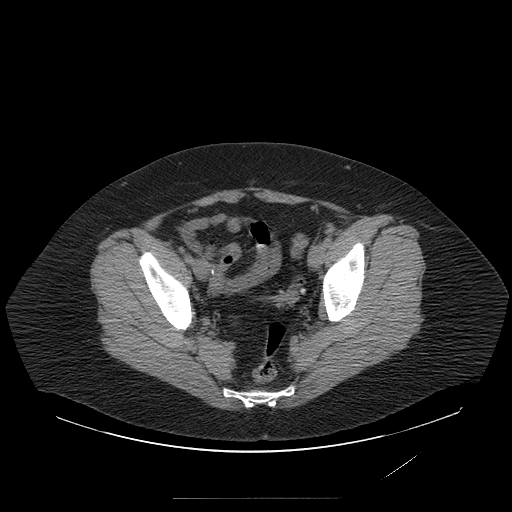
[im 29/85  soft-tissue]
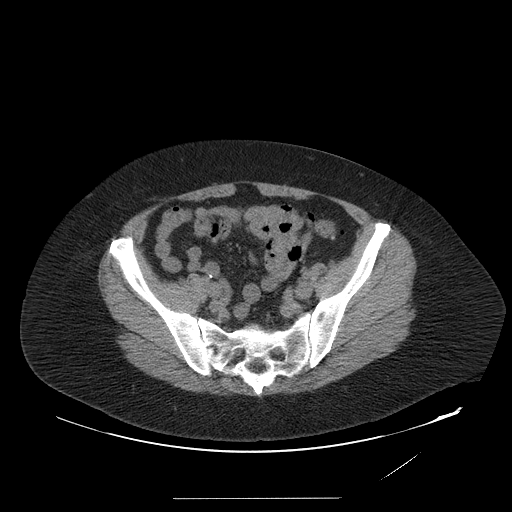
[im 36/85  soft-tissue]
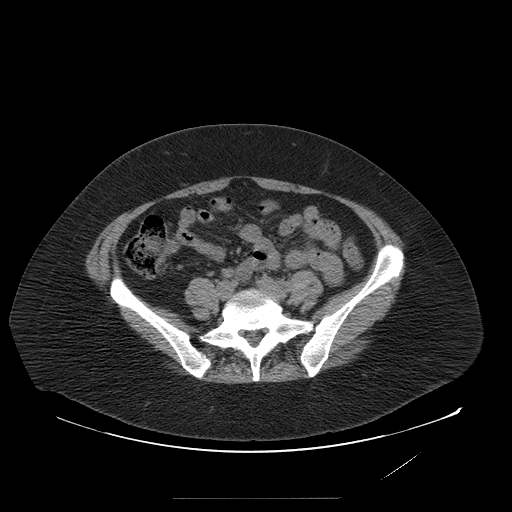
[im 39/85  soft-tissue]
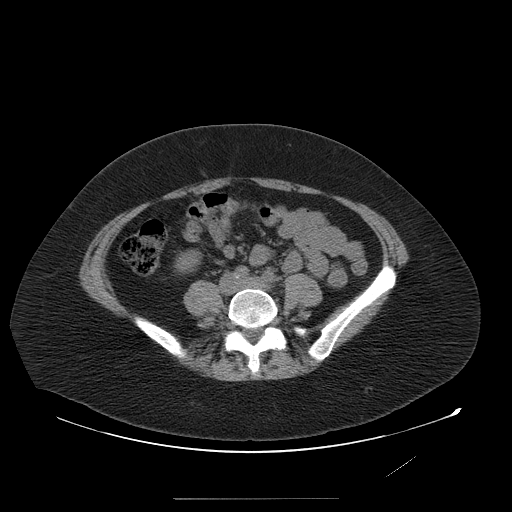
[im 46/85  soft-tissue]
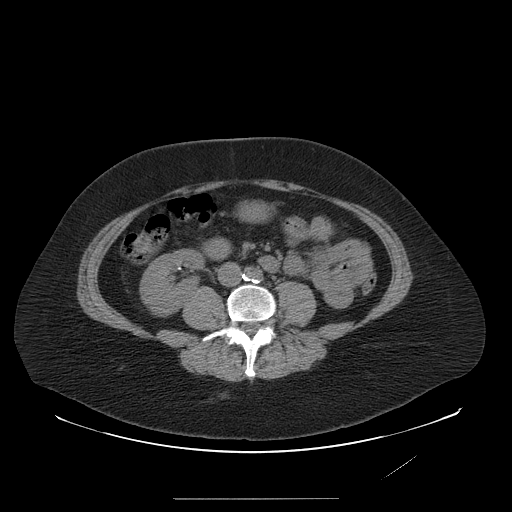
[im 50/85  soft-tissue]
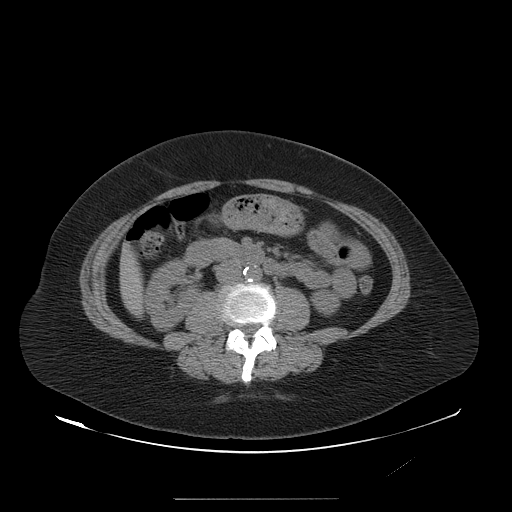
[im 50/85  bone]
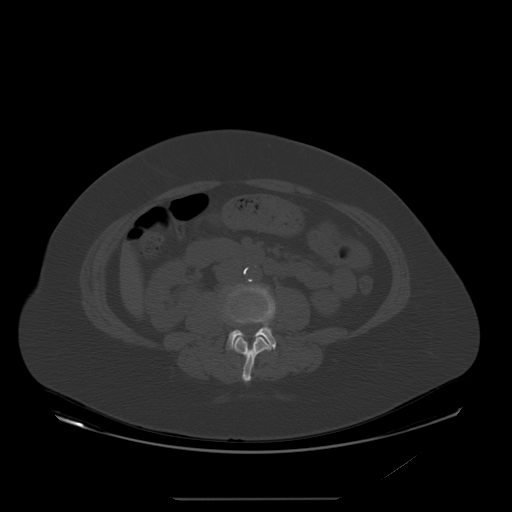
[im 57/85  soft-tissue]
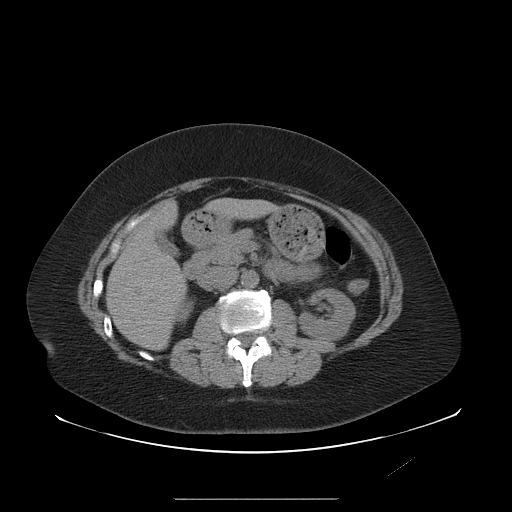
[im 64/85  soft-tissue]
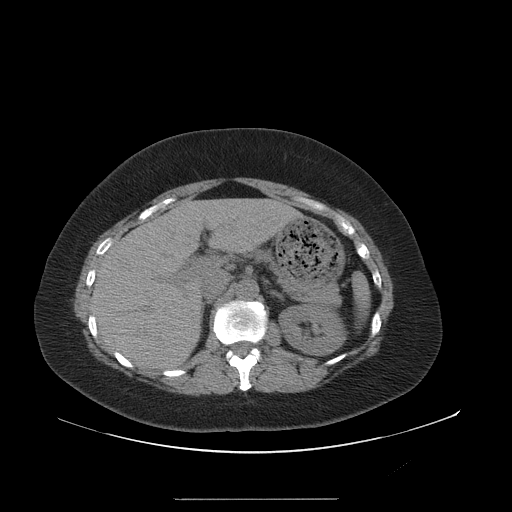
[im 67/85  soft-tissue]
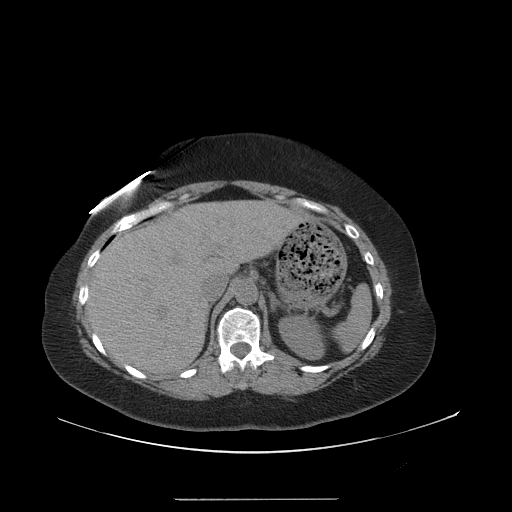
[im 74/85  soft-tissue]
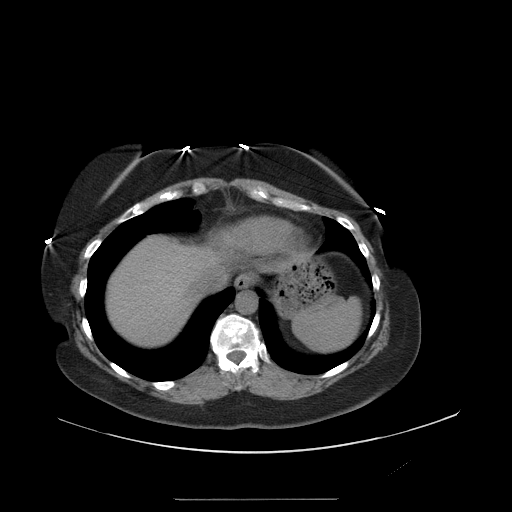
[im 81/85  soft-tissue]
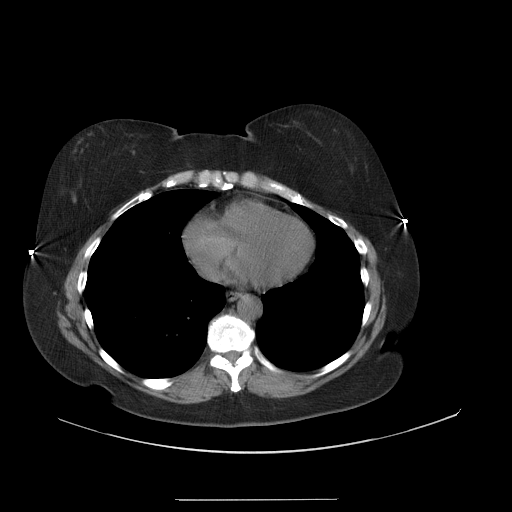

[Series 400: cor · coronal · 0.90mm/px · 3 of 125 slices shown]
[im 42/125  soft-tissue]
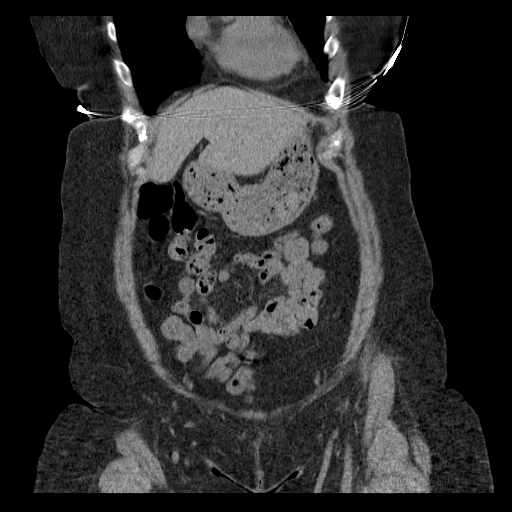
[im 56/125  soft-tissue]
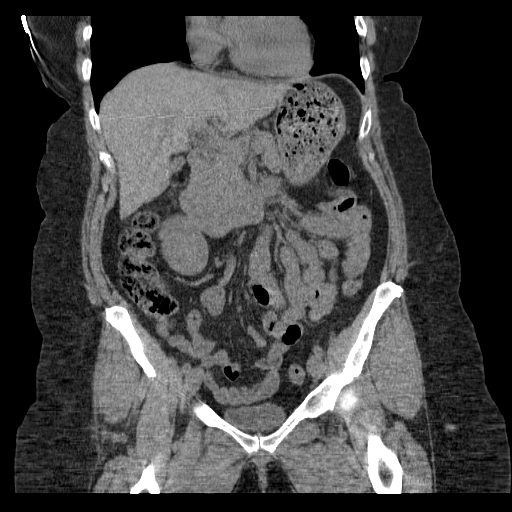
[im 69/125  soft-tissue]
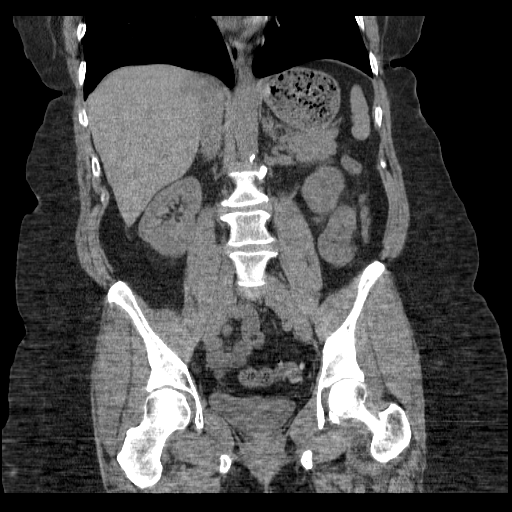

[17 of 46 positions shown; findings below may reference images not displayed]

FINDINGS: Lung bases are clear.  No effusions.  Heart is normal
size.

Bilateral small nonobstructing renal stones.  2-3 mm stone in the
lower pole of the right kidney.  5-6 mm nonobstructing stone in the
lower pole of the left kidney.  No hydronephrosis.  No ureteral
stones.  Urinary bladder is unremarkable.

Prior hysterectomy.  No adnexal masses.  Scattered sigmoid
diverticula.  No active diverticulitis.  Small bowel is
decompressed.

Liver, spleen, stomach, pancreas, adrenals have an unremarkable
unenhanced appearance.  Gallbladder is contracted.

No free fluid, free air or adenopathy.

No acute bony abnormality.
IMPRESSION: Small bilateral nonobstructing lower pole renal stones.  No
ureteral stones or hydronephrosis.

Sigmoid diverticulosis.

No acute findings.

## 2012-12-28 ENCOUNTER — Other Ambulatory Visit: Payer: Self-pay

## 2013-01-02 IMAGING — US US ABDOMEN COMPLETE
1 series · 14 of 25 positions shown · non-contrast
Comparison: CT 11/30/2011

CLINICAL DATA: Abdominal pain.

COMPLETE ABDOMINAL ULTRASOUND

[Series 1: us abdomen complete · 0.25mm/px · 14 of 83 slices shown]
[im 1/83]
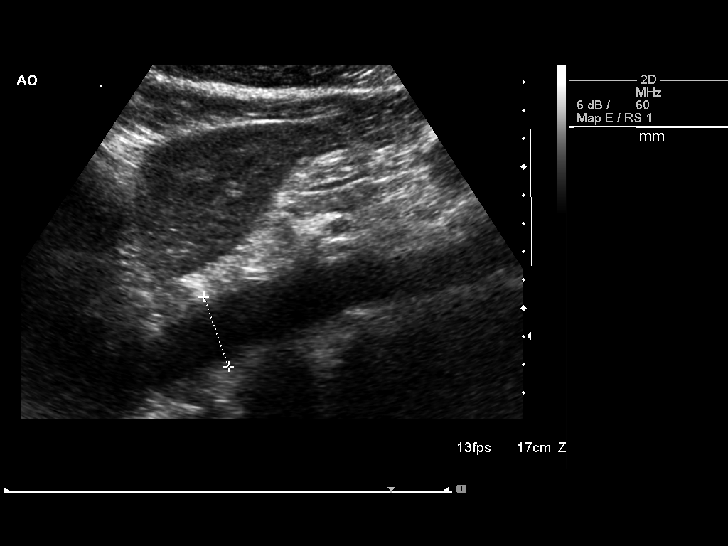
[im 7/83]
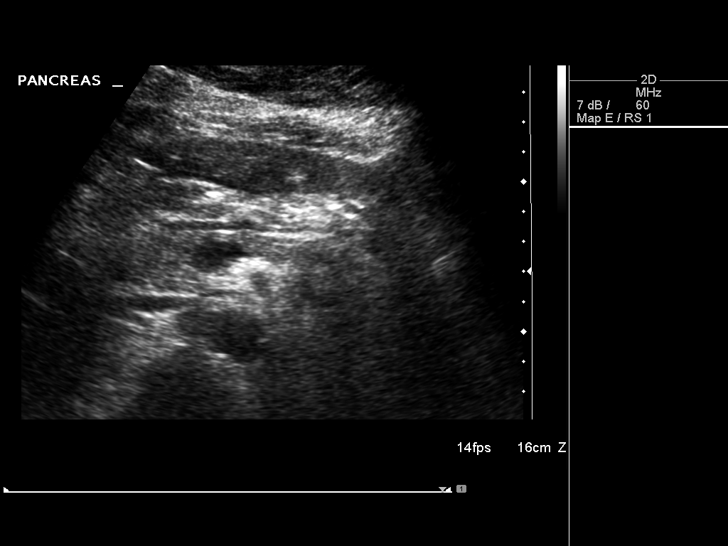
[im 14/83]
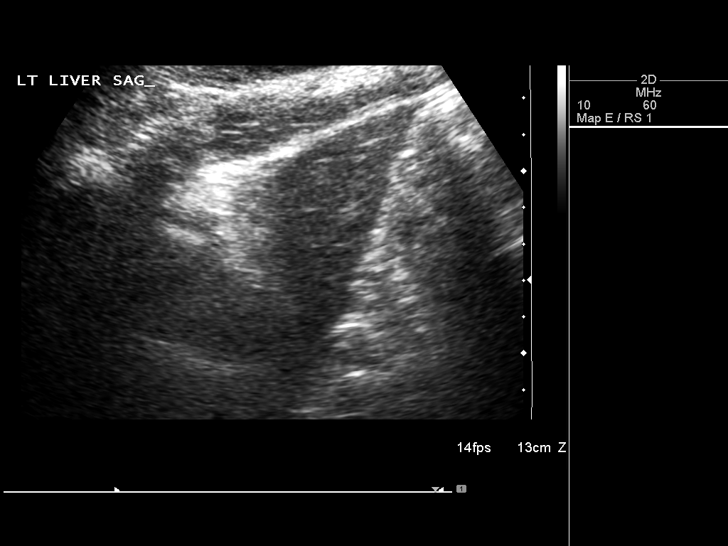
[im 21/83]
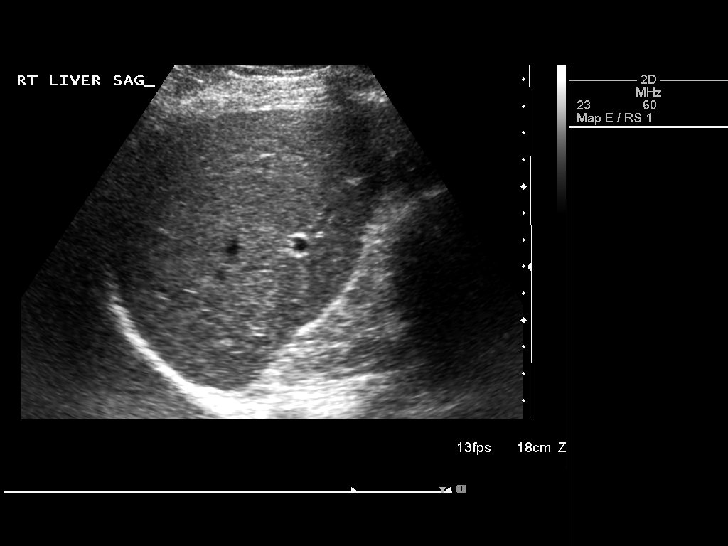
[im 28/83]
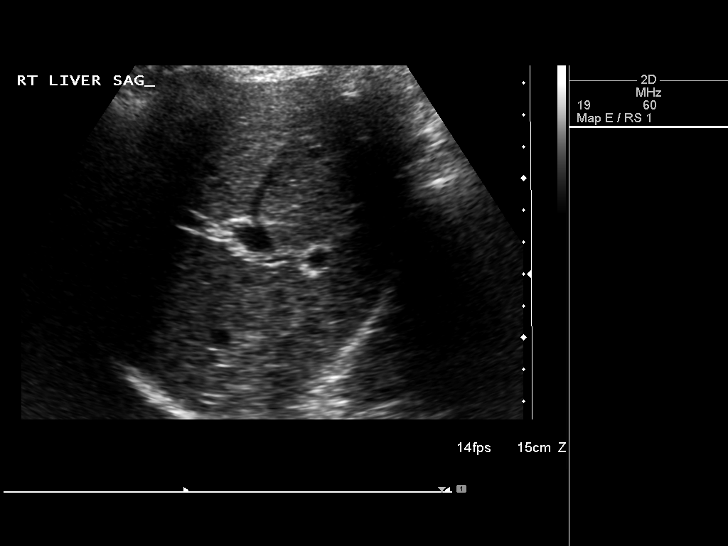
[im 31/83]
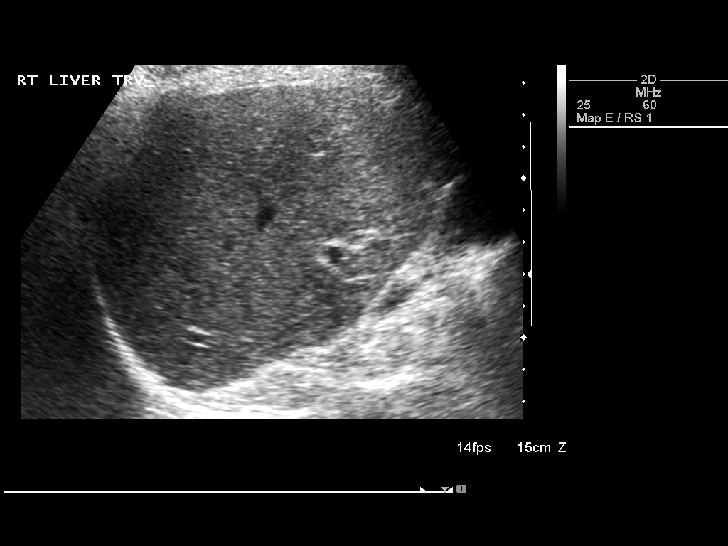
[im 38/83]
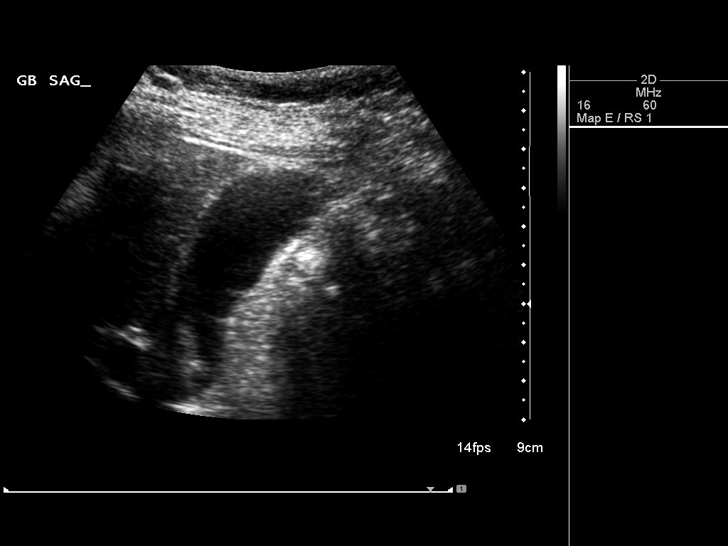
[im 45/83]
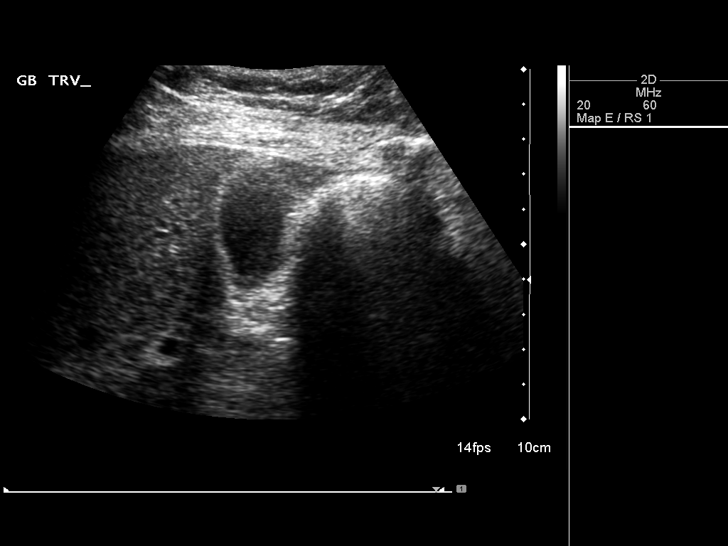
[im 52/83]
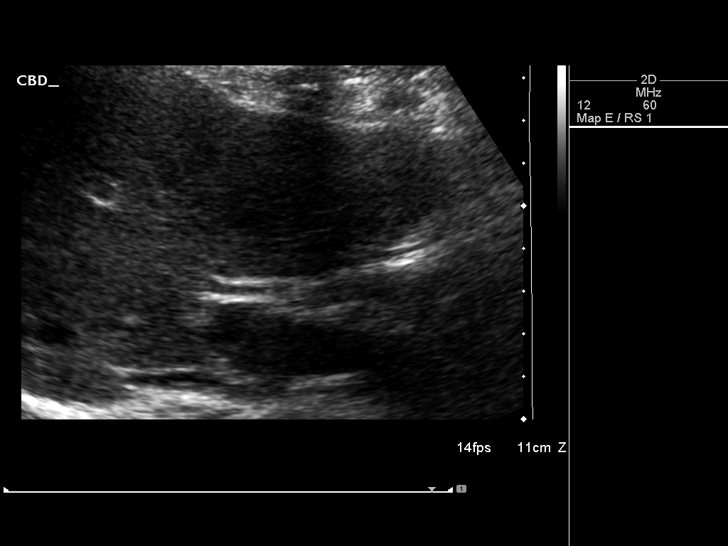
[im 55/83]
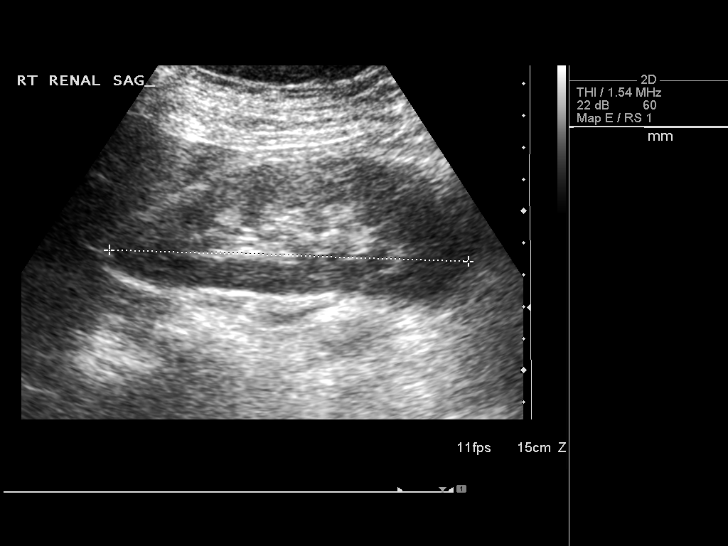
[im 62/83]
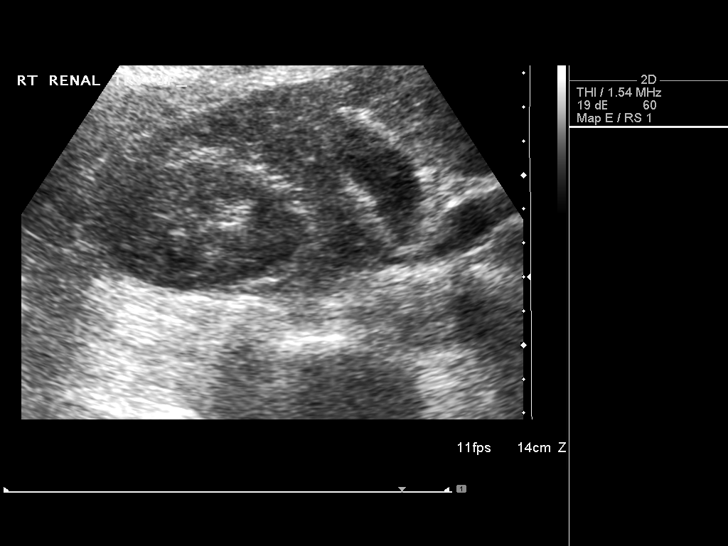
[im 69/83]
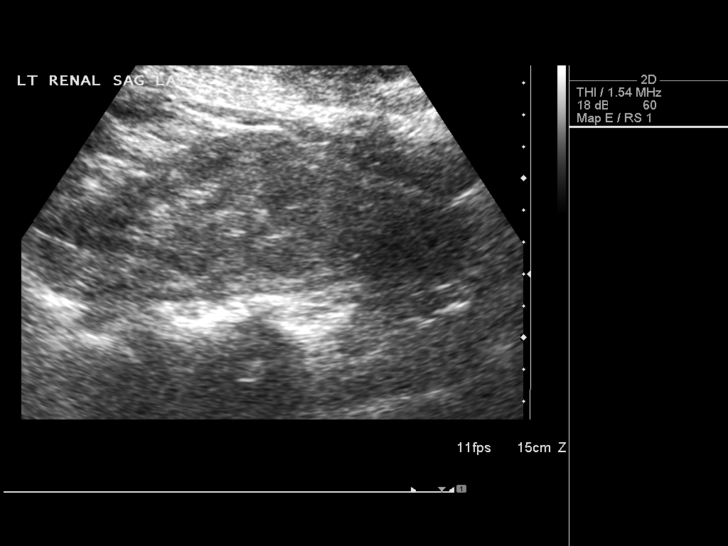
[im 76/83]
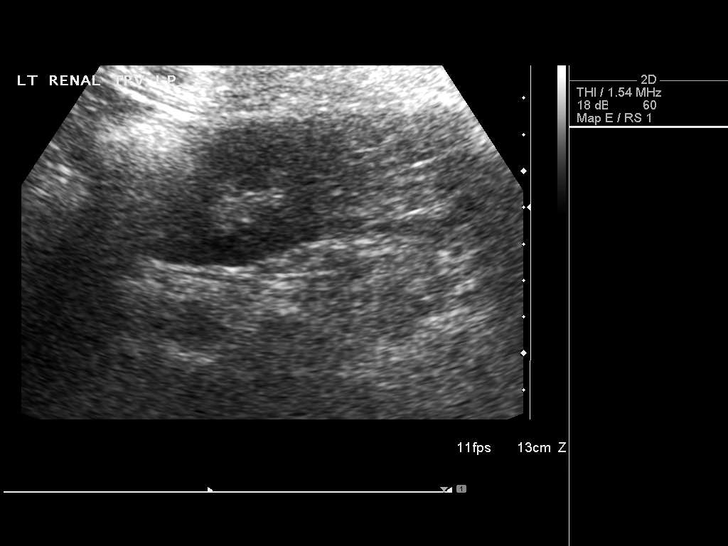
[im 83/83]
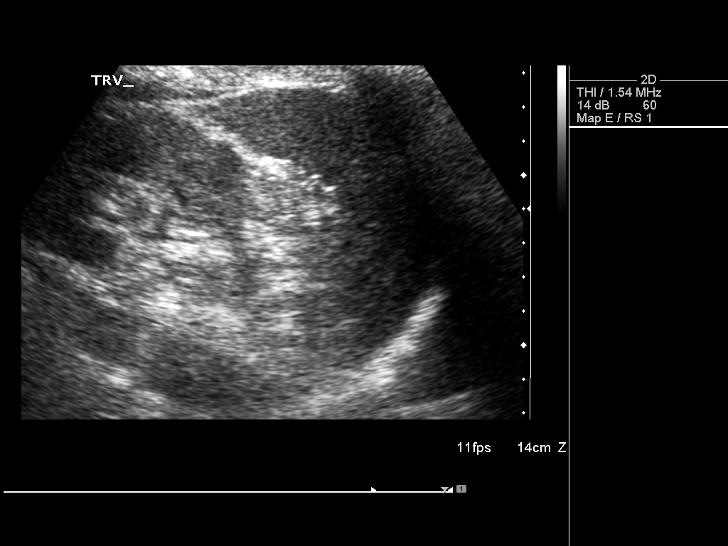

[14 of 25 positions shown; findings below may reference images not displayed]

FINDINGS: Gallbladder:  No gallstones, gallbladder wall thickening, or
pericholecystic fluid.

Common bile duct:   Normal caliber, 3 mm.

Liver:  No focal lesion identified.  Within normal limits in
parenchymal echogenicity.

IVC:  Appears normal.

Pancreas:  No focal abnormality seen.

Spleen:  Within normal limits in size and echotexture.

Right Kidney:   Normal in size and parenchymal echogenicity.  No
evidence of mass or hydronephrosis. Previously seen small renal
stones by CT not appreciated.

Left Kidney:  Normal in size and parenchymal echogenicity.  No
evidence of mass or hydronephrosis. Previously seen small renal
stones by CT not appreciated.

Abdominal aorta:  No aneurysm identified.
IMPRESSION: Negative abdominal ultrasound.

## 2013-03-06 ENCOUNTER — Ambulatory Visit (INDEPENDENT_AMBULATORY_CARE_PROVIDER_SITE_OTHER): Payer: BC Managed Care – PPO | Admitting: Family Medicine

## 2013-03-06 ENCOUNTER — Encounter: Payer: Self-pay | Admitting: Family Medicine

## 2013-03-06 ENCOUNTER — Other Ambulatory Visit: Payer: Self-pay | Admitting: Family Medicine

## 2013-03-06 VITALS — BP 104/64 | HR 71 | Temp 98.1°F | Ht 65.0 in | Wt 201.4 lb

## 2013-03-06 DIAGNOSIS — T148XXA Other injury of unspecified body region, initial encounter: Secondary | ICD-10-CM

## 2013-03-06 DIAGNOSIS — M19011 Primary osteoarthritis, right shoulder: Secondary | ICD-10-CM

## 2013-03-06 DIAGNOSIS — F17200 Nicotine dependence, unspecified, uncomplicated: Secondary | ICD-10-CM

## 2013-03-06 DIAGNOSIS — E559 Vitamin D deficiency, unspecified: Secondary | ICD-10-CM

## 2013-03-06 DIAGNOSIS — E785 Hyperlipidemia, unspecified: Secondary | ICD-10-CM

## 2013-03-06 DIAGNOSIS — I10 Essential (primary) hypertension: Secondary | ICD-10-CM

## 2013-03-06 DIAGNOSIS — Q682 Congenital deformity of knee: Secondary | ICD-10-CM

## 2013-03-06 DIAGNOSIS — Q741 Congenital malformation of knee: Secondary | ICD-10-CM

## 2013-03-06 DIAGNOSIS — X503XXA Overexertion from repetitive movements, initial encounter: Secondary | ICD-10-CM

## 2013-03-06 DIAGNOSIS — M19019 Primary osteoarthritis, unspecified shoulder: Secondary | ICD-10-CM

## 2013-03-06 DIAGNOSIS — K219 Gastro-esophageal reflux disease without esophagitis: Secondary | ICD-10-CM

## 2013-03-06 MED ORDER — LISINOPRIL-HYDROCHLOROTHIAZIDE 20-12.5 MG PO TABS: 1.0000 | ORAL_TABLET | Freq: Every day | ORAL | Status: DC

## 2013-03-06 NOTE — Patient Instructions (Signed)
Smoking Cessation Quitting smoking is important to your health and has many advantages. However, it is not always easy to quit since nicotine is a very addictive drug. Often times, people try 3 times or more before being able to quit. This document explains the best ways for you to prepare to quit smoking. Quitting takes hard work and a lot of effort, but you can do it. ADVANTAGES OF QUITTING SMOKING  You will live longer, feel better, and live better.  Your body will feel the impact of quitting smoking almost immediately.  Within 20 minutes, blood pressure decreases. Your pulse returns to its normal level.  After 8 hours, carbon monoxide levels in the blood return to normal. Your oxygen level increases.  After 24 hours, the chance of having a heart attack starts to decrease. Your breath, hair, and body stop smelling like smoke.  After 48 hours, damaged nerve endings begin to recover. Your sense of taste and smell improve.  After 72 hours, the body is virtually free of nicotine. Your bronchial tubes relax and breathing becomes easier.  After 2 to 12 weeks, lungs can hold more air. Exercise becomes easier and circulation improves.  The risk of having a heart attack, stroke, cancer, or lung disease is greatly reduced.  After 1 year, the risk of coronary heart disease is cut in half.  After 5 years, the risk of stroke falls to the same as a nonsmoker.  After 10 years, the risk of lung cancer is cut in half and the risk of other cancers decreases significantly.  After 15 years, the risk of coronary heart disease drops, usually to the level of a nonsmoker.  If you are pregnant, quitting smoking will improve your chances of having a healthy baby.  The people you live with, especially any children, will be healthier.  You will have extra money to spend on things other than cigarettes. QUESTIONS TO THINK ABOUT BEFORE ATTEMPTING TO QUIT You may want to talk about your answers with your  caregiver.  Why do you want to quit?  If you tried to quit in the past, what helped and what did not?  What will be the most difficult situations for you after you quit? How will you plan to handle them?  Who can help you through the tough times? Your family? Friends? A caregiver?  What pleasures do you get from smoking? What ways can you still get pleasure if you quit? Here are some questions to ask your caregiver:  How can you help me to be successful at quitting?  What medicine do you think would be best for me and how should I take it?  What should I do if I need more help?  What is smoking withdrawal like? How can I get information on withdrawal? GET READY  Set a quit date.  Change your environment by getting rid of all cigarettes, ashtrays, matches, and lighters in your home, car, or work. Do not let people smoke in your home.  Review your past attempts to quit. Think about what worked and what did not. GET SUPPORT AND ENCOURAGEMENT You have a better chance of being successful if you have help. You can get support in many ways.  Tell your family, friends, and co-workers that you are going to quit and need their support. Ask them not to smoke around you.  Get individual, group, or telephone counseling and support. Programs are available at local hospitals and health centers. Call your local health department for   information about programs in your area.  Spiritual beliefs and practices may help some smokers quit.  Download a "quit meter" on your computer to keep track of quit statistics, such as how long you have gone without smoking, cigarettes not smoked, and money saved.  Get a self-help book about quitting smoking and staying off of tobacco. [place] yourself from urges to smoke. Talk to someone, go for a walk, or occupy your time with a task.  Change your normal routine. Take a different route to work. Drink tea instead of coffee.  Eat breakfast in a different place.  Reduce your stress. Take a hot bath, exercise, or read a book.  Plan something enjoyable to do every day. Reward yourself for not smoking.  Explore interactive web-based programs that specialize in helping you quit. GET MEDICINE AND USE IT CORRECTLY Medicines can help you stop smoking and decrease the urge to smoke. Combining medicine with the above behavioral methods and support can greatly increase your chances of successfully quitting smoking.  Nicotine replacement therapy helps deliver nicotine to your body without the negative effects and risks of smoking. Nicotine replacement therapy includes nicotine gum, lozenges, inhalers, nasal sprays, and skin patches. Some may be available over-the-counter and others require a prescription.  Antidepressant medicine helps people abstain from smoking, but how this works is unknown. This medicine is available by prescription.  Nicotinic receptor partial agonist medicine simulates the effect of nicotine in your brain. This medicine is available by prescription. Ask your caregiver for advice about which medicines to use and how to use them based on your health history. Your caregiver will tell you what side effects to look out for if you choose to be on a medicine or therapy. Carefully read the information on the package. Do not use any other product containing nicotine while using a nicotine replacement product.  RELAPSE OR DIFFICULT SITUATIONS Most relapses occur within the first 3 months after quitting. Do not be discouraged if you start smoking again. Remember, most people try several times before finally quitting. You may have symptoms of withdrawal because your body is used to nicotine. You may crave cigarettes, be irritable, feel very hungry, cough often, get headaches, or have difficulty concentrating. The withdrawal symptoms are only temporary. They are strongest when you first quit, but they will go away within  10 14 days. To reduce the chances of relapse, try to:  Avoid drinking alcohol. Drinking lowers your chances of successfully quitting.  Reduce the amount of caffeine you consume. Once you quit smoking, the amount of caffeine in your body increases and can give you symptoms, such as a rapid heartbeat, sweating, and anxiety.  Avoid smokers because they can make you want to smoke.  Do not let weight gain distract you. Many smokers will gain weight when they quit, usually less than 10 pounds. Eat a healthy diet and stay active. You can always lose the weight gained after you quit.  Find ways to improve your mood other than smoking. FOR MORE INFORMATION  www.smokefree.gov  Document Released: 02/02/2001 Document Revised: 08/10/2011 Document Reviewed: 05/20/2011 [facility] Patient Information 2014 [place], [place].        Dr Paula Libra Recommendations  For nutrition information, I recommend books:  1).Eat to Live by Dr Excell Seltzer. 2).Prevent and Reverse Heart Disease by Dr Karl Luke. 3) Dr Janene Harvey Book:  Program to Reverse Diabetes  Exercise recommendations are:  If unable to walk, then the patient can  exercise in a chair 3 times a day. By flapping arms like a bird gently and raising legs outwards to the front.  If ambulatory, the patient can go for walks for 30 minutes 3 times a week. Then increase the intensity and duration as tolerated.  Goal is to try to attain exercise frequency to 5 times a week.  If applicable: Best to perform resistance exercises (machines or weights) 2 days a week and cardio type exercises 3 days per week.  

## 2013-03-06 NOTE — Progress Notes (Signed)
Patient ID: Whitney Ross, female   DOB: 01-16-1956, 58 y.o.   MRN: [id number] SUBJECTIVE: CC: Chief Complaint  Patient presents with  . Follow-up    4 month follow up wants 90 day supply refills     HPI: Patient is here for follow up of hyperlipidemia/HTN/GERD/Vitamin D Def/tobacco use disorder: denies Headache;denies Chest Pain;denies weakness;denies Shortness of Breath and orthopnea;denies Visual changes;denies palpitations;denies cough;denies pedal edema;denies symptoms of TIA or stroke;deniesClaudication symptoms. admits to Compliance with medications; denies Problems with medications.  Foot doctor planning on doing surgery on the right foot. Chiropractor working on the right shoulder. Right thumb flares up every now and then.  Past Medical History  Diagnosis Date  . Hypertension   . Elevated cholesterol   . Neck pain   . Chronic kidney disease     hx muscle stones  . Lumbago   . Nephrolithiasis    Past Surgical History  Procedure Laterality Date  . Vaginal hysterectomy  1997    TOTAL VAGINAL  . Cesarean section  1987  . Cystoscopy     History   Social History  . Marital Status: Married    Spouse Name: N/A    Number of Children: N/A  . Years of Education: N/A   Occupational History  . Not on file.   Social History Main Topics  . Smoking status: Current Every Day Smoker -- 1.00 packs/day  . Smokeless tobacco: Never Used  . Alcohol Use: No     Comment: RARELY  . Drug Use: No  . Sexual Activity: Not Currently   Other Topics Concern  . Not on file   Social History Narrative  . No narrative on file   Family History  Problem Relation Age of Onset  . Heart failure Mother   . Cancer Father     MELENOMA  . Hypertension Sister   . Hyperlipidemia Sister   . Hyperlipidemia Brother   . Hypertension Brother   . Colon cancer Neg Hx   . Esophageal cancer Neg Hx   . Stomach cancer Neg Hx   . Rectal cancer Neg Hx    Current Outpatient Prescriptions on  File Prior to Visit  Medication Sig Dispense Refill  . aspirin 81 MG chewable tablet Chew 81 mg by mouth daily.      Marland Kitchen atorvastatin (LIPITOR) 40 MG tablet Take 1 tablet (40 mg total) by mouth daily.  30 tablet  5  . Azelastine HCl (ASTEPRO) 0.15 % SOLN Place 2 sprays into the nose 2 (two) times daily.  1 mL  2  . fluticasone (FLONASE) 50 MCG/ACT nasal spray Place 2 sprays into the nose daily.  1 g  2  . omeprazole (PRILOSEC) 40 MG capsule Take 1 capsule (40 mg total) by mouth daily.  30 capsule  5  . amoxicillin (AMOXIL) 500 MG capsule Take 500 mg by mouth 3 (three) times daily. Dr Bobby Rumpf       No current facility-administered medications on file prior to visit.   Allergies  Allergen Reactions  . Cortisporin [Bacitra-Neomycin-Polymyxin-Hc]     There is no immunization history on file for this patient. Prior to Admission medications   Medication Sig Start Date End Date Taking? Authorizing Provider  aspirin 81 MG chewable tablet Chew 81 mg by mouth daily.   Yes Historical Provider, MD  atorvastatin (LIPITOR) 40 MG tablet Take 1 tablet (40 mg total) by mouth daily. 11/06/12  Yes Vernie Shanks, MD  Azelastine HCl (ASTEPRO) 0.15 % SOLN  Place 2 sprays into the nose 2 (two) times daily. 05/19/12  Yes Vernie Shanks, MD  fluticasone (FLONASE) 50 MCG/ACT nasal spray Place 2 sprays into the nose daily. 05/19/12  Yes Vernie Shanks, MD  lisinopril-hydrochlorothiazide (ZESTORETIC) 20-12.5 MG per tablet Take 1 tablet by mouth daily. 07/28/12  Yes Vernie Shanks, MD  omeprazole (PRILOSEC) 40 MG capsule Take 1 capsule (40 mg total) by mouth daily. 07/28/12  Yes Vernie Shanks, MD  amoxicillin (AMOXIL) 500 MG capsule Take 500 mg by mouth 3 (three) times daily. Dr Bobby Rumpf    Historical Provider, MD     ROS: As above in the HPI. All other systems are stable or negative.  OBJECTIVE: APPEARANCE:  Patient in no acute distress.The patient appeared well nourished and normally developed. Acyanotic. Waist: VITAL  SIGNS:BP 104/64  Pulse 71  Temp(Src) 98.1 F (36.7 C) (Oral)  Ht '5\' 5"'  (1.651 m)  Wt 201 lb 6.4 oz (91.354 kg)  BMI 33.51 kg/m2 [name] SKIN: warm and  Dry without overt rashes, tattoos and scars  HEAD and Neck: without JVD, Head and scalp: normal Eyes:No scleral icterus. Fundi normal, eye movements normal. Ears: Auricle normal, canal normal, Tympanic membranes normal, insufflation normal. Nose: normal Throat: normal Neck & thyroid: normal  CHEST & LUNGS: Chest wall: normal Lungs: Clear  CVS: Reveals the PMI to be normally located. Regular rhythm, First and Second Heart sounds are normal,  absence of murmurs, rubs or gallops. Peripheral vasculature: Radial pulses: normal Dorsal pedis pulses: normal Posterior pulses: normal  ABDOMEN:  Appearance: Obese Benign, no organomegaly, no masses, no Abdominal Aortic enlargement. No Guarding , no rebound. No Bruits. Bowel sounds: normal  RECTAL: N/A GU: N/A  EXTREMETIES: nonedematous. Right foot midfoot tender.  MUSCULOSKELETAL:  Spine: normal Joints: genu Valgus  NEUROLOGIC: oriented to time,place and person; nonfocal. Strength is normal Sensory is normal Reflexes are normal Cranial Nerves are normal.  Results for orders placed in visit on 11/03/12  CMP14+EGFR      Result Value Range   Glucose 91  65 - 99 mg/dL   BUN 12  6 - 24 mg/dL   Creatinine, Ser 0.71  0.57 - 1.00 mg/dL   GFR calc non Af Amer 96  >59 mL/min/1.73   GFR calc Af Amer 111  >59 mL/min/1.73   BUN/Creatinine Ratio 17  9 - 23   Sodium 140  134 - 144 mmol/L   Potassium 4.3  3.5 - 5.2 mmol/L   Chloride 98  97 - 108 mmol/L   CO2 25  18 - 29 mmol/L   Calcium 9.0  8.7 - 10.2 mg/dL   Total Protein 6.7  6.0 - 8.5 g/dL   Albumin 4.5  3.5 - 5.5 g/dL   Globulin, Total 2.2  1.5 - 4.5 g/dL   Albumin/Globulin Ratio 2.0  1.1 - 2.5   Total Bilirubin 0.4  0.0 - 1.2 mg/dL   Alkaline Phosphatase 66  39 - 117 IU/L   AST 27  0 - 40 IU/L   ALT 30  0 - 32 IU/L   NMR, LIPOPROFILE      Result Value Range   LDL Particle Number [id number] (*) <1000 nmol/L   LDLC SERPL CALC-MCNC 99  <100 mg/dL   HDL Cholesterol by NMR 59  >=40 mg/dL   Triglycerides by NMR 66  <150 mg/dL   Cholesterol 171  <200 mg/dL   HDL Particle Number 37.5  >=30.5 umol/L   Small LDL Particle Number [id number] (*) <=  527 nmol/L   LDL Size 20.5 (*) >20.5 nm   LP-IR Score 35  <=45  VITAMIN D 25 HYDROXY      Result Value Range   Vit D, 25-Hydroxy 24.2 (*) 30.0 - 100.0 ng/mL    ASSESSMENT:  HLD (hyperlipidemia) - Plan: CMP14+EGFR, NMR, lipoprofile  HTN (hypertension) - Plan: CMP14+EGFR, lisinopril-hydrochlorothiazide (ZESTORETIC) 20-12.5 MG per tablet  Unspecified vitamin D deficiency - Plan: Vit D  25 hydroxy (rtn osteoporosis monitoring)  Overuse syndrome  GERD (gastroesophageal reflux disease)  Arthritis of right shoulder region  Tobacco use disorder  Genu valgus, congenital  PLAN: Smoking cessation 7 minutes discussed and counseled. Handout in the AVS.       Dr Paula Libra Recommendations  For nutrition information, I recommend books:  1).Eat to Live by Dr Excell Seltzer. 2).Prevent and Reverse Heart Disease by Dr Karl Luke. 3) Dr Janene Harvey Book:  Program to Reverse Diabetes  Exercise recommendations are:  If unable to walk, then the patient can exercise in a chair 3 times a day. By flapping arms like a bird gently and raising legs outwards to the front.  If ambulatory, the patient can go for walks for 30 minutes 3 times a week. Then increase the intensity and duration as tolerated.  Goal is to try to attain exercise frequency to 5 times a week.  If applicable: Best to perform resistance exercises (machines or weights) 2 days a week and cardio type exercises 3 days per week.  Orders Placed This Encounter  Procedures  . CMP14+EGFR  . NMR, lipoprofile  . Vit D  25 hydroxy (rtn osteoporosis monitoring)   Meds ordered this encounter  Medications  .  lisinopril-hydrochlorothiazide (ZESTORETIC) 20-12.5 MG per tablet    Sig: Take 1 tablet by mouth daily.    Dispense:  90 tablet    Refill:  3   Medications Discontinued During This Encounter  Medication Reason  . lisinopril-hydrochlorothiazide (ZESTORETIC) 20-12.5 MG per tablet Reorder   Return in about 4 months (around 07/04/2013) for Recheck medical problems.  Rickiya Picariello P. Jacelyn Grip, M.D.

## 2013-03-07 ENCOUNTER — Ambulatory Visit: Payer: BC Managed Care – PPO | Admitting: Family Medicine

## 2013-03-08 LAB — NMR, LIPOPROFILE
Cholesterol: 145 mg/dL (ref <200)
HDL Cholesterol by NMR: 60 mg/dL (ref >=40)
HDL Particle Number: 35.6 umol/L (ref >=30.5)
LDL Particle Number: 651 nmol/L (ref <1000)
LDL Size: 21.2 nm (ref >20.5)
LDLC SERPL CALC-MCNC: 70 mg/dL (ref <100)
LP-IR Score: <25 (ref <=45)
Small LDL Particle Number: 195 nmol/L (ref <=527)
Triglycerides by NMR: 75 mg/dL (ref <150)

## 2013-03-08 LAB — CMP14+EGFR
ALT: 21 IU/L (ref 0–32)
AST: 16 IU/L (ref 0–40)
Albumin/Globulin Ratio: 2.1 (ref 1.1–2.5)
Albumin: 4.1 g/dL (ref 3.5–5.5)
Alkaline Phosphatase: 67 IU/L (ref 39–117)
BUN/Creatinine Ratio: 12 (ref 9–23)
BUN: 9 mg/dL (ref 6–24)
CO2: 28 mmol/L (ref 18–29)
Calcium: 8.9 mg/dL (ref 8.7–10.2)
Chloride: 99 mmol/L (ref 97–108)
Creatinine, Ser: 0.77 mg/dL (ref 0.57–1.00)
GFR calc Af Amer: 101 mL/min/{1.73_m2} (ref >59)
GFR calc non Af Amer: 87 mL/min/{1.73_m2} (ref >59)
Globulin, Total: 2 g/dL (ref 1.5–4.5)
Glucose: 97 mg/dL (ref 65–99)
Potassium: 4 mmol/L (ref 3.5–5.2)
Sodium: 141 mmol/L (ref 134–144)
Total Bilirubin: 0.4 mg/dL (ref 0.0–1.2)
Total Protein: 6.1 g/dL (ref 6.0–8.5)

## 2013-03-08 LAB — VITAMIN D 25 HYDROXY (VIT D DEFICIENCY, FRACTURES): Vit D, 25-Hydroxy: 19.9 ng/mL — ABNORMAL LOW (ref 30.0–100.0)

## 2013-03-08 NOTE — Progress Notes (Signed)
Quick Note:  Call Patient Labs that are abnormal: Vit D is low  The rest are at goal  Recommendations: Start on Vit D 4000 units daily. Will need to recheck level at next visit.   ______

## 2013-05-02 ENCOUNTER — Other Ambulatory Visit: Payer: Self-pay | Admitting: Family Medicine

## 2013-06-01 ENCOUNTER — Encounter: Payer: Self-pay | Admitting: *Deleted

## 2013-06-25 ENCOUNTER — Encounter: Payer: Self-pay | Admitting: Family Medicine

## 2013-06-25 ENCOUNTER — Ambulatory Visit (INDEPENDENT_AMBULATORY_CARE_PROVIDER_SITE_OTHER): Payer: BC Managed Care – PPO | Admitting: Family Medicine

## 2013-06-25 VITALS — BP 122/76 | HR 67 | Temp 97.3°F | Ht 65.0 in | Wt 205.2 lb

## 2013-06-25 DIAGNOSIS — I10 Essential (primary) hypertension: Secondary | ICD-10-CM

## 2013-06-25 DIAGNOSIS — Q741 Congenital malformation of knee: Secondary | ICD-10-CM

## 2013-06-25 DIAGNOSIS — E559 Vitamin D deficiency, unspecified: Secondary | ICD-10-CM

## 2013-06-25 DIAGNOSIS — E785 Hyperlipidemia, unspecified: Secondary | ICD-10-CM

## 2013-06-25 DIAGNOSIS — F17200 Nicotine dependence, unspecified, uncomplicated: Secondary | ICD-10-CM

## 2013-06-25 DIAGNOSIS — Q682 Congenital deformity of knee: Secondary | ICD-10-CM

## 2013-06-25 DIAGNOSIS — K219 Gastro-esophageal reflux disease without esophagitis: Secondary | ICD-10-CM

## 2013-06-25 NOTE — Progress Notes (Signed)
Patient ID: Whitney Ross, female   DOB: [birth year], 58 y.o.   MRN: [id number] SUBJECTIVE: CC: Chief Complaint  Patient presents with  . Hypertension    4 month recheck  . Hyperlipidemia    HPI:  Patient is here for follow up of hyperlipidemia/HTN/smoker/stress: denies Headache;denies Chest Pain;denies weakness;denies Shortness of Breath and orthopnea;denies Visual changes;denies palpitations;denies cough;denies pedal edema;denies symptoms of TIA or stroke;deniesClaudication symptoms. admits to Compliance with medications; denies Problems with medications.  Personal marital stress.makes her smoke under stress.  Past Medical History  Diagnosis Date  . Hypertension   . Elevated cholesterol   . Neck pain   . Chronic kidney disease     hx muscle stones  . Lumbago   . Nephrolithiasis    Past Surgical History  Procedure Laterality Date  . Vaginal hysterectomy  1997    TOTAL VAGINAL  . Cesarean section  1987  . Cystoscopy     History   Social History  . Marital Status: Married    Spouse Name: N/A    Number of Children: N/A  . Years of Education: N/A   Occupational History  . Not on file.   Social History Main Topics  . Smoking status: Current Every Day Smoker -- 1.00 packs/day  . Smokeless tobacco: Never Used  . Alcohol Use: No     Comment: RARELY  . Drug Use: No  . Sexual Activity: Not Currently   Other Topics Concern  . Not on file   Social History Narrative  . No narrative on file   Family History  Problem Relation Age of Onset  . Heart failure Mother   . Cancer Father     MELENOMA  . Hypertension Sister   . Hyperlipidemia Sister   . Hyperlipidemia Brother   . Hypertension Brother   . Colon cancer Neg Hx   . Esophageal cancer Neg Hx   . Stomach cancer Neg Hx   . Rectal cancer Neg Hx    Current Outpatient Prescriptions on File Prior to Visit  Medication Sig Dispense Refill  . aspirin 81 MG chewable tablet Chew 81 mg by mouth daily.      Marland Kitchen  atorvastatin (LIPITOR) 40 MG tablet TAKE 1 TABLET (40 MG TOTAL) BY MOUTH DAILY.  30 tablet  2  . Azelastine HCl (ASTEPRO) 0.15 % SOLN Place 2 sprays into the nose 2 (two) times daily.  1 mL  2  . fluticasone (FLONASE) 50 MCG/ACT nasal spray Place 2 sprays into the nose daily.  1 g  2  . lisinopril-hydrochlorothiazide (ZESTORETIC) 20-12.5 MG per tablet Take 1 tablet by mouth daily.  90 tablet  3  . omeprazole (PRILOSEC) 40 MG capsule TAKE 1 CAPSULE (40 MG TOTAL) BY MOUTH DAILY.  30 capsule  5   No current facility-administered medications on file prior to visit.   Allergies  Allergen Reactions  . Cortisporin [Bacitra-Neomycin-Polymyxin-Hc]     There is no immunization history on file for this patient. Prior to Admission medications   Medication Sig Start Date End Date Taking? Authorizing Provider  aspirin 81 MG chewable tablet Chew 81 mg by mouth daily.   Yes Historical Provider, MD  atorvastatin (LIPITOR) 40 MG tablet TAKE 1 TABLET (40 MG TOTAL) BY MOUTH DAILY.   Yes Vernie Shanks, MD  Azelastine HCl (ASTEPRO) 0.15 % SOLN Place 2 sprays into the nose 2 (two) times daily. 05/19/12  Yes Vernie Shanks, MD  fluticasone (FLONASE) 50 MCG/ACT nasal spray Place 2 sprays  into the nose daily. 05/19/12  Yes Vernie Shanks, MD  lisinopril-hydrochlorothiazide (ZESTORETIC) 20-12.5 MG per tablet Take 1 tablet by mouth daily. 03/06/13  Yes Vernie Shanks, MD  omeprazole (PRILOSEC) 40 MG capsule TAKE 1 CAPSULE (40 MG TOTAL) BY MOUTH DAILY. 03/06/13  Yes Vernie Shanks, MD     ROS: As above in the HPI. All other systems are stable or negative.  OBJECTIVE: APPEARANCE:  Patient in no acute distress.The patient appeared well nourished and normally developed. Acyanotic. Waist: VITAL SIGNS:BP 122/76  Pulse 67  Temp(Src) 97.3 F (36.3 C) (Oral)  Ht '5\' 5"'  (1.651 m)  Wt 205 lb 3.2 oz (93.078 kg)  BMI 34.15 kg/m2 WF   SKIN: warm and  Dry without overt rashes, tattoos and scars  HEAD and Neck: without  JVD, Head and scalp: normal Eyes:No scleral icterus. Fundi normal, eye movements normal. Ears: Auricle normal, canal normal, Tympanic membranes normal, insufflation normal. Nose: normal Throat: normal Neck & thyroid: normal  CHEST & LUNGS: Chest wall: normal Lungs: Clear  CVS: Reveals the PMI to be normally located. Regular rhythm, First and Second Heart sounds are normal,  absence of murmurs, rubs or gallops. Peripheral vasculature: Radial pulses: normal Dorsal pedis pulses: normal Posterior pulses: normal  ABDOMEN:  Appearance: obese. Benign, no organomegaly, no masses, no Abdominal Aortic enlargement. No Guarding , no rebound. No Bruits. Bowel sounds: normal  RECTAL: N/A GU: N/A  EXTREMETIES: nonedematous.  MUSCULOSKELETAL:  Spine: normal Joints: intact  NEUROLOGIC: oriented to time,place and person; nonfocal. Strength is normal Sensory is normal Reflexes are normal Cranial Nerves are normal. Results for orders placed in visit on 03/06/13  CMP14+EGFR      Result Value Ref Range   Glucose 97  65 - 99 mg/dL   BUN 9  6 - 24 mg/dL   Creatinine, Ser 0.77  0.57 - 1.00 mg/dL   GFR calc non Af Amer 87  >59 mL/min/1.73   GFR calc Af Amer 101  >59 mL/min/1.73   BUN/Creatinine Ratio 12  9 - 23   Sodium 141  134 - 144 mmol/L   Potassium 4.0  3.5 - 5.2 mmol/L   Chloride 99  97 - 108 mmol/L   CO2 28  18 - 29 mmol/L   Calcium 8.9  8.7 - 10.2 mg/dL   Total Protein 6.1  6.0 - 8.5 g/dL   Albumin 4.1  3.5 - 5.5 g/dL   Globulin, Total 2.0  1.5 - 4.5 g/dL   Albumin/Globulin Ratio 2.1  1.1 - 2.5   Total Bilirubin 0.4  0.0 - 1.2 mg/dL   Alkaline Phosphatase 67  39 - 117 IU/L   AST 16  0 - 40 IU/L   ALT 21  0 - 32 IU/L  NMR, LIPOPROFILE      Result Value Ref Range   LDL Particle Number [id number]  <1000 nmol/L   LDLC SERPL CALC-MCNC 70  <100 mg/dL   HDL Cholesterol by NMR 60  >=40 mg/dL   Triglycerides by NMR 75  <150 mg/dL   Cholesterol 145  <200 mg/dL   HDL Particle Number  [id number]  >=30.5 umol/L   Small LDL Particle Number 195  <=527 nmol/L   LDL Size 21.2  >20.5 nm   LP-IR Score < 25  <= 45  VITAMIN D 25 HYDROXY      Result Value Ref Range   Vit D, 25-Hydroxy 19.9 (*) 30.0 - 100.0 ng/mL    ASSESSMENT:  Tobacco use disorder  HTN (hypertension) - Plan: CMP14+EGFR  HLD (hyperlipidemia) - Plan: Lipid panel  Unspecified vitamin D deficiency - Plan: Vit D  25 hydroxy (rtn osteoporosis monitoring)  GERD (gastroesophageal reflux disease)  Genu valgus, congenital  PLAN: Smoking cessation counseling.  Stress management. Supportive therapy. Nurse, learning disability.  Orders Placed This Encounter  Procedures  . CMP14+EGFR  . Lipid panel  . Vit D  25 hydroxy (rtn osteoporosis monitoring)   No orders of the defined types were placed in this encounter.   Medications Discontinued During This Encounter  Medication Reason  . amoxicillin (AMOXIL) 500 MG capsule Completed Course   Return in about 4 months (around 10/26/2013) for Recheck medical problems.  Doniqua Saxby P. Jacelyn Grip, M.D.

## 2013-06-25 NOTE — Patient Instructions (Addendum)
Smoking Cessation Quitting smoking is important to your health and has many advantages. However, it is not always easy to quit since nicotine is a very addictive drug. Often times, people try 3 times or more before being able to quit. This document explains the best ways for you to prepare to quit smoking. Quitting takes hard work and a lot of effort, but you can do it. ADVANTAGES OF QUITTING SMOKING  You will live longer, feel better, and live better.  Your body will feel the impact of quitting smoking almost immediately.  Within 20 minutes, blood pressure decreases. Your pulse returns to its normal level.  After 8 hours, carbon monoxide levels in the blood return to normal. Your oxygen level increases.  After 24 hours, the chance of having a heart attack starts to decrease. Your breath, hair, and body stop smelling like smoke.  After 48 hours, damaged nerve endings begin to recover. Your sense of taste and smell improve.  After 72 hours, the body is virtually free of nicotine. Your bronchial tubes relax and breathing becomes easier.  After 2 to 12 weeks, lungs can hold more air. Exercise becomes easier and circulation improves.  The risk of having a heart attack, stroke, cancer, or lung disease is greatly reduced.  After 1 year, the risk of coronary heart disease is cut in half.  After 5 years, the risk of stroke falls to the same as a nonsmoker.  After 10 years, the risk of lung cancer is cut in half and the risk of other cancers decreases significantly.  After 15 years, the risk of coronary heart disease drops, usually to the level of a nonsmoker.  If you are pregnant, quitting smoking will improve your chances of having a healthy baby.  The people you live with, especially any children, will be healthier.  You will have extra money to spend on things other than cigarettes. QUESTIONS TO THINK ABOUT BEFORE ATTEMPTING TO QUIT You may want to talk about your answers with your  caregiver.  Why do you want to quit?  If you tried to quit in the past, what helped and what did not?  What will be the most difficult situations for you after you quit? How will you plan to handle them?  Who can help you through the tough times? Your family? Friends? A caregiver?  What pleasures do you get from smoking? What ways can you still get pleasure if you quit? Here are some questions to ask your caregiver:  How can you help me to be successful at quitting?  What medicine do you think would be best for me and how should I take it?  What should I do if I need more help?  What is smoking withdrawal like? How can I get information on withdrawal? GET READY  Set a quit date.  Change your environment by getting rid of all cigarettes, ashtrays, matches, and lighters in your home, car, or work. Do not let people smoke in your home.  Review your past attempts to quit. Think about what worked and what did not. GET SUPPORT AND ENCOURAGEMENT You have a better chance of being successful if you have help. You can get support in many ways.  Tell your family, friends, and co-workers that you are going to quit and need their support. Ask them not to smoke around you.  Get individual, group, or telephone counseling and support. Programs are available at local hospitals and health centers. Call your local health department for   information about programs in your area.  Spiritual beliefs and practices may help some smokers quit.  Download a "quit meter" on your computer to keep track of quit statistics, such as how long you have gone without smoking, cigarettes not smoked, and money saved.  Get a self-help book about quitting smoking and staying off of tobacco. [place] yourself from urges to smoke. Talk to someone, go for a walk, or occupy your time with a task.  Change your normal routine. Take a different route to work. Drink tea instead of coffee.  Eat breakfast in a different place.  Reduce your stress. Take a hot bath, exercise, or read a book.  Plan something enjoyable to do every day. Reward yourself for not smoking.  Explore interactive web-based programs that specialize in helping you quit. GET MEDICINE AND USE IT CORRECTLY Medicines can help you stop smoking and decrease the urge to smoke. Combining medicine with the above behavioral methods and support can greatly increase your chances of successfully quitting smoking.  Nicotine replacement therapy helps deliver nicotine to your body without the negative effects and risks of smoking. Nicotine replacement therapy includes nicotine gum, lozenges, inhalers, nasal sprays, and skin patches. Some may be available over-the-counter and others require a prescription.  Antidepressant medicine helps people abstain from smoking, but how this works is unknown. This medicine is available by prescription.  Nicotinic receptor partial agonist medicine simulates the effect of nicotine in your brain. This medicine is available by prescription. Ask your caregiver for advice about which medicines to use and how to use them based on your health history. Your caregiver will tell you what side effects to look out for if you choose to be on a medicine or therapy. Carefully read the information on the package. Do not use any other product containing nicotine while using a nicotine replacement product.  RELAPSE OR DIFFICULT SITUATIONS Most relapses occur within the first 3 months after quitting. Do not be discouraged if you start smoking again. Remember, most people try several times before finally quitting. You may have symptoms of withdrawal because your body is used to nicotine. You may crave cigarettes, be irritable, feel very hungry, cough often, get headaches, or have difficulty concentrating. The withdrawal symptoms are only temporary. They are strongest when you first quit, but they will go away within  10 14 days. To reduce the chances of relapse, try to:  Avoid drinking alcohol. Drinking lowers your chances of successfully quitting.  Reduce the amount of caffeine you consume. Once you quit smoking, the amount of caffeine in your body increases and can give you symptoms, such as a rapid heartbeat, sweating, and anxiety.  Avoid smokers because they can make you want to smoke.  Do not let weight gain distract you. Many smokers will gain weight when they quit, usually less than 10 pounds. Eat a healthy diet and stay active. You can always lose the weight gained after you quit.  Find ways to improve your mood other than smoking. FOR MORE INFORMATION  www.smokefree.gov  Document Released: 02/02/2001 Document Revised: 08/10/2011 Document Reviewed: 05/20/2011 [facility] Patient Information 2014 [place], [place].   Stress Management Stress is a state of physical or mental tension that often results from changes in your life or normal routine. Some common causes of stress are:  Death of a loved one.  Injuries or severe illnesses.  Getting fired or changing jobs.  Moving into a new home. Other causes may be:  Sexual problems.  Business or financial losses.  Taking on a large debt.  Regular conflict with someone at home or at work.  Constant tiredness from lack of sleep. It is not just bad things that are stressful. It may be stressful to:  Win the lottery.  Get married.  Buy a new car. The amount of stress that can be easily tolerated varies from person to person. Changes generally cause stress, regardless of the types of change. Too much stress can affect your health. It may lead to physical or emotional problems. Too little stress (boredom) may also become stressful. SUGGESTIONS TO REDUCE STRESS:  Talk things over with your family and friends. It often is helpful to share your concerns and worries. If you feel your problem is serious, you may want to get help from a  professional counselor.  Consider your problems one at a time instead of lumping them all together. Trying to take care of everything at once may seem impossible. List all the things you need to do and then start with the most important one. Set a goal to accomplish 2 or 3 things each day. If you expect to do too many in a single day you will naturally fail, causing you to feel even more stressed.  Do not use alcohol or drugs to relieve stress. Although you may feel better for a short time, they do not remove the problems that caused the stress. They can also be habit forming.  Exercise regularly - at least 3 times per week. Physical exercise can help to relieve that "uptight" feeling and will relax you.  The shortest distance between despair and hope is often a good night's sleep.  Go to bed and get up on time allowing yourself time for appointments without being rushed.  Take a short "time-out" period from any stressful situation that occurs during the day. Close your eyes and take some deep breaths. Starting with the muscles in your face, tense them, hold it for a few seconds, then relax. Repeat this with the muscles in your neck, shoulders, hand, stomach, back and legs.  Take good care of yourself. Eat a balanced diet and get plenty of rest.  Schedule time for having fun. Take a break from your daily routine to relax. HOME CARE INSTRUCTIONS   Call if you feel overwhelmed by your problems and feel you can no longer manage them on your own.  Return immediately if you feel like hurting yourself or someone else. Document Released: 08/04/2000 Document Revised: 05/03/2011 Document Reviewed: 10/03/2012 [facility] Patient Information 2014 [place], [place].

## 2013-06-26 LAB — CMP14+EGFR
ALT: 28 IU/L (ref 0–32)
AST: 23 IU/L (ref 0–40)
Albumin/Globulin Ratio: 1.9 (ref 1.1–2.5)
Albumin: 4.4 g/dL (ref 3.5–5.5)
Alkaline Phosphatase: 67 IU/L (ref 39–117)
BUN/Creatinine Ratio: 13 (ref 9–23)
BUN: 10 mg/dL (ref 6–24)
CO2: 27 mmol/L (ref 18–29)
Calcium: 9.4 mg/dL (ref 8.7–10.2)
Chloride: 99 mmol/L (ref 97–108)
Creatinine, Ser: 0.75 mg/dL (ref 0.57–1.00)
GFR calc Af Amer: 104 mL/min/{1.73_m2} (ref >59)
GFR calc non Af Amer: 90 mL/min/{1.73_m2} (ref >59)
Globulin, Total: 2.3 g/dL (ref 1.5–4.5)
Glucose: 94 mg/dL (ref 65–99)
Potassium: 4.3 mmol/L (ref 3.5–5.2)
Sodium: 140 mmol/L (ref 134–144)
Total Bilirubin: 0.5 mg/dL (ref 0.0–1.2)
Total Protein: 6.7 g/dL (ref 6.0–8.5)

## 2013-06-26 LAB — LIPID PANEL
Chol/HDL Ratio: 2.7 ratio units (ref 0.0–4.4)
Cholesterol, Total: 159 mg/dL (ref 100–199)
HDL: 58 mg/dL (ref >39)
LDL Calculated: 84 mg/dL (ref 0–99)
Triglycerides: 83 mg/dL (ref 0–149)
VLDL Cholesterol Cal: 17 mg/dL (ref 5–40)

## 2013-06-26 LAB — VITAMIN D 25 HYDROXY (VIT D DEFICIENCY, FRACTURES): Vit D, 25-Hydroxy: 16.4 ng/mL — ABNORMAL LOW (ref 30.0–100.0)

## 2013-06-27 NOTE — Progress Notes (Signed)
Quick Note:  Call Patient Labs that are abnormal:  Vitamin D is very low The rest are at goal  Recommendations: Start on Vitamin D 4,000 Units daily. Follow up as planned   ______

## 2013-06-28 ENCOUNTER — Telehealth: Payer: Self-pay | Admitting: *Deleted

## 2013-06-28 NOTE — Telephone Encounter (Signed)
Pt aware of lab results and will begin Vit D 4000 units/day. rs

## 2013-06-28 NOTE — Telephone Encounter (Signed)
Call for results.  Need to increase Vitamin D to 4000 u daily.

## 2013-06-28 NOTE — Telephone Encounter (Signed)
Message copied by Shelbie Ammons on Thu Jun 28, 2013  3:52 PM ------      Message from: Vernie Shanks      Created: Wed Jun 27, 2013  9:59 PM       Call Patient      Labs that are abnormal:            Vitamin D is very low      The rest are at goal            Recommendations:      Start on Vitamin D 4,000 Units daily.      Follow up as planned             ------

## 2013-06-29 ENCOUNTER — Ambulatory Visit: Payer: BC Managed Care – PPO | Admitting: Family Medicine

## 2013-07-10 ENCOUNTER — Other Ambulatory Visit: Payer: Self-pay | Admitting: *Deleted

## 2013-07-10 MED ORDER — OMEPRAZOLE 40 MG PO CPDR: DELAYED_RELEASE_CAPSULE | ORAL | Status: DC

## 2013-07-10 MED ORDER — ATORVASTATIN CALCIUM 40 MG PO TABS: 40.0000 mg | ORAL_TABLET | Freq: Every day | ORAL | Status: DC

## 2013-07-19 ENCOUNTER — Telehealth: Payer: Self-pay | Admitting: Family Medicine

## 2013-07-19 MED ORDER — DIAZEPAM 5 MG PO TABS: 5.0000 mg | ORAL_TABLET | Freq: Two times a day (BID) | ORAL | Status: DC | PRN

## 2013-07-19 NOTE — Telephone Encounter (Signed)
Called in.

## 2013-07-19 NOTE — Telephone Encounter (Signed)
Please call in Valium 5mg  1 po prn flying #[phone] with 0 refills

## 2013-11-06 ENCOUNTER — Ambulatory Visit (INDEPENDENT_AMBULATORY_CARE_PROVIDER_SITE_OTHER): Payer: BC Managed Care – PPO | Admitting: Family Medicine

## 2013-11-06 ENCOUNTER — Encounter: Payer: Self-pay | Admitting: Family Medicine

## 2013-11-06 VITALS — BP 109/76 | HR 74 | Temp 98.8°F | Ht 65.0 in | Wt 205.0 lb

## 2013-11-06 DIAGNOSIS — J0191 Acute recurrent sinusitis, unspecified: Secondary | ICD-10-CM

## 2013-11-06 DIAGNOSIS — J019 Acute sinusitis, unspecified: Secondary | ICD-10-CM

## 2013-11-06 DIAGNOSIS — I10 Essential (primary) hypertension: Secondary | ICD-10-CM

## 2013-11-06 DIAGNOSIS — E785 Hyperlipidemia, unspecified: Secondary | ICD-10-CM

## 2013-11-06 MED ORDER — AZITHROMYCIN 250 MG PO TABS: ORAL_TABLET | ORAL | Status: DC

## 2013-11-06 NOTE — Progress Notes (Signed)
   Subjective:    Patient ID: Whitney Ross, female    DOB: 1955-06-11, 58 y.o.   MRN: [id number]  HPI  58 year old female who is here to followup hypertension and hyperlipidemia. Today she complains of some myalgias particularly in her legs and thinks it may be related to the Lipitor. She also has some sinus congestion and seasonal rhinitis. She was recently treated with some sort of sulfa antibiotic for sinus infection. She states that her nerves are doing well. Stress in the home as it relates to her relationship.    Review of Systems  HENT: Positive for ear pain and sinus pressure.   Musculoskeletal: Positive for myalgias.       Objective:   Physical Exam  Constitutional: She is oriented to person, place, and time.  HENT:  R TM slightly red Sinus "tingle" when palapted  Cardiovascular: Normal rate and regular rhythm.   Pulmonary/Chest: Effort normal.  Neurological: She is alert and oriented to person, place, and time. She has normal reflexes.    BP 109/76  Pulse 74  Temp(Src) 98.8 F (37.1 C) (Oral)  Ht 5\' 5"  (1.651 m)  Wt 205 lb (92.987 kg)  BMI 34.11 kg/m2      Assessment & Plan:  1. Essential hypertension BP good on current med  2. HLD (hyperlipidemia) Given CHD risk of 2% and myalgias on statin, will rec stopping and rechecking in 6 months  3. Acute recurrent sinusitis, unspecified location Flonase + Z pac  Wardell Honour MD

## 2013-12-24 ENCOUNTER — Encounter: Payer: Self-pay | Admitting: Family Medicine

## 2014-01-30 ENCOUNTER — Ambulatory Visit (INDEPENDENT_AMBULATORY_CARE_PROVIDER_SITE_OTHER): Payer: BC Managed Care – PPO | Admitting: Nurse Practitioner

## 2014-01-30 ENCOUNTER — Encounter: Payer: Self-pay | Admitting: Nurse Practitioner

## 2014-01-30 VITALS — BP 123/83 | HR 70 | Temp 98.5°F | Ht 65.0 in | Wt 198.0 lb

## 2014-01-30 DIAGNOSIS — K219 Gastro-esophageal reflux disease without esophagitis: Secondary | ICD-10-CM

## 2014-01-30 MED ORDER — PANTOPRAZOLE SODIUM 40 MG PO TBEC: 40.0000 mg | DELAYED_RELEASE_TABLET | Freq: Every day | ORAL | Status: DC

## 2014-01-30 NOTE — Patient Instructions (Signed)

## 2014-01-30 NOTE — Progress Notes (Signed)
   Subjective:    Patient ID: Whitney Ross, female    DOB: [birth year], 58 y.o.   MRN: [id number]  HPI Patient went to urgent care 2 weeks ago with heart burn- they gave her zantac 150 to take for 2 weeks and to continue omeprazole- has gotten some better. Hasn't been eating much because was afraid it would flare up.    Review of Systems  Constitutional: Negative.   HENT: Negative.   Respiratory: Negative.   Cardiovascular: Negative.   Genitourinary: Negative.   Neurological: Negative.   Psychiatric/Behavioral: Negative.   All other systems reviewed and are negative.      Objective:   Physical Exam  Constitutional: She is oriented to person, place, and time. She appears well-developed and well-nourished. No distress.  Cardiovascular: Normal rate and normal heart sounds.   Pulmonary/Chest: Effort normal and breath sounds normal.  Abdominal: Soft. Bowel sounds are normal. She exhibits no distension. There is no tenderness.  Neurological: She is alert and oriented to person, place, and time.  Skin: Skin is warm and dry.  Psychiatric: She has a normal mood and affect. Her behavior is normal. Judgment and thought content normal.   BP 123/83 mmHg  Pulse 70  Temp(Src) 98.5 F (36.9 C) (Oral)  Ht 5\' 5"  (1.651 m)  Wt 198 lb (89.812 kg)  BMI 32.95 kg/m2        Assessment & Plan:   1. Gastroesophageal reflux disease without esophagitis    Meds ordered this encounter  Medications  . pantoprazole (PROTONIX) 40 MG tablet    Sig: Take 1 tablet (40 mg total) by mouth daily.    Dispense:  30 tablet    Refill:  3    Order Specific Question:  Supervising Provider    Answer:  Laurance Flatten, DONALD W [[id number]]   Stopped omeprazole Watch spicy and fatty foods Do not eat 2 hours prior to bedtime RTO prn  Mary-Margaret Hassell Done, FNP

## 2014-04-03 ENCOUNTER — Other Ambulatory Visit: Payer: Self-pay | Admitting: *Deleted

## 2014-04-03 MED ORDER — LISINOPRIL-HYDROCHLOROTHIAZIDE 20-12.5 MG PO TABS: 1.0000 | ORAL_TABLET | Freq: Every day | ORAL | Status: DC

## 2014-05-09 ENCOUNTER — Ambulatory Visit: Payer: BC Managed Care – PPO | Admitting: Nurse Practitioner

## 2014-05-09 ENCOUNTER — Ambulatory Visit: Payer: BC Managed Care – PPO | Admitting: Family Medicine

## 2015-12-25 ENCOUNTER — Ambulatory Visit (INDEPENDENT_AMBULATORY_CARE_PROVIDER_SITE_OTHER): Payer: Self-pay | Admitting: Physician Assistant

## 2015-12-25 ENCOUNTER — Encounter: Payer: Self-pay | Admitting: Physician Assistant

## 2015-12-25 VITALS — BP 124/88 | HR 79 | Temp 98.0°F | Ht 65.0 in | Wt 199.0 lb

## 2015-12-25 DIAGNOSIS — I10 Essential (primary) hypertension: Secondary | ICD-10-CM

## 2015-12-25 DIAGNOSIS — K2100 Gastro-esophageal reflux disease with esophagitis, without bleeding: Secondary | ICD-10-CM

## 2015-12-25 DIAGNOSIS — T753XXD Motion sickness, subsequent encounter: Secondary | ICD-10-CM

## 2015-12-25 DIAGNOSIS — J310 Chronic rhinitis: Secondary | ICD-10-CM

## 2015-12-25 MED ORDER — AZELASTINE HCL 0.15 % NA SOLN: 2.0000 | Freq: Two times a day (BID) | NASAL | 11 refills | Status: DC

## 2015-12-25 MED ORDER — DIAZEPAM 5 MG PO TABS: 5.0000 mg | ORAL_TABLET | Freq: Two times a day (BID) | ORAL | 1 refills | Status: DC | PRN

## 2015-12-25 MED ORDER — TRAZODONE HCL 100 MG PO TABS: 100.0000 mg | ORAL_TABLET | Freq: Every day | ORAL | 11 refills | Status: DC

## 2015-12-25 NOTE — Patient Instructions (Signed)
Food Choices for Gastroesophageal Reflux Disease, Adult When you have gastroesophageal reflux disease (GERD), the foods you eat and your eating habits are very important. Choosing the right foods can help ease the discomfort of GERD. WHAT GENERAL GUIDELINES DO I NEED TO FOLLOW?  Choose fruits, vegetables, whole grains, low-fat dairy products, and low-fat meat, fish, and poultry.  Limit fats such as oils, salad dressings, butter, nuts, and avocado.  Keep a food diary to identify foods that cause symptoms.  Avoid foods that cause reflux. These may be different for different people.  Eat frequent small meals instead of three large meals each day.  Eat your meals slowly, in a relaxed setting.  Limit fried foods.  Cook foods using methods other than frying.  Avoid drinking alcohol.  Avoid drinking large amounts of liquids with your meals.  Avoid bending over or lying down until 2-3 hours after eating. WHAT FOODS ARE NOT RECOMMENDED? The following are some foods and drinks that may worsen your symptoms: Vegetables Tomatoes. Tomato juice. Tomato and spaghetti sauce. Chili peppers. Onion and garlic. Horseradish. Fruits Oranges, grapefruit, and lemon (fruit and juice). Meats High-fat meats, fish, and poultry. This includes hot dogs, ribs, ham, sausage, salami, and bacon. Dairy Whole milk and chocolate milk. Sour cream. Cream. Butter. Ice cream. Cream cheese.  Beverages Coffee and tea, with or without caffeine. Carbonated beverages or energy drinks. Condiments Hot sauce. Barbecue sauce.  Sweets/Desserts Chocolate and cocoa. Donuts. Peppermint and spearmint. Fats and Oils High-fat foods, including French fries and potato chips. Other Vinegar. Strong spices, such as black pepper, white pepper, red pepper, cayenne, curry powder, cloves, ginger, and chili powder. The items listed above may not be a complete list of foods and beverages to avoid. Contact your dietitian for more  information.   This information is not intended to replace advice given to you by your health care provider. Make sure you discuss any questions you have with your health care provider.   Document Released: 02/08/2005 Document Revised: 03/01/2014 Document Reviewed: 12/13/2012 Elsevier Interactive Patient Education 2016 [employer]  

## 2015-12-25 NOTE — Progress Notes (Signed)
BP 124/88   Pulse 79   Temp 98 F (36.7 C) (Oral)   Ht 5\' 5"  (1.651 m)   Wt 199 lb (90.3 kg)   BMI 33.12 kg/m    Subjective:    Patient ID: Whitney Ross, female    DOB: 1955-05-13, 60 y.o.   MRN: [id number]  Whitney Ross is a 60 y.o. female presenting on 12/25/2015 for Heartburn (Chest sore to the touch) and medication follow up (Going on a trip and would like something to help with her nerves )  HPI Patient here to be established as new patient at [facility].  This patient is known to me from [facility]. She has episodic issues with vertigo and dizziness and will be flying to [facility] in about a week.  She is also having a significant issue with IBS. Shw will have reflux but often has dysphagia.  Has not been for GI evaluation. Discussed taking GERD med regularly to prevent damage to esophagus.  Some of her symptoms sound more like spasm.  She gets constipated at times.  Will try to address this medically.  Past Medical History:  Diagnosis Date  . Chronic kidney disease    hx muscle stones  . Elevated cholesterol   . Hypertension   . Lumbago   . Neck pain   . Nephrolithiasis    Relevant past medical, surgical, family and social history reviewed and updated as indicated. Interim medical history since our last visit reviewed. Allergies and medications reviewed and updated.   Data reviewed from any sources in EPIC.  Review of Systems  Constitutional: Negative.  Negative for activity change, fatigue and fever.  HENT: Negative.   Eyes: Negative.   Respiratory: Negative.  Negative for cough and wheezing.   Cardiovascular: Negative.  Negative for chest pain, palpitations and leg swelling.  Gastrointestinal: Positive for abdominal distention and nausea. Negative for abdominal pain, blood in stool and vomiting.  Endocrine: Negative.   Genitourinary: Negative.  Negative for dysuria.  Musculoskeletal: Negative.   Skin: Negative.     Neurological: Negative.      Social History   Social History  . Marital status: Married    Spouse name: N/A  . Number of children: N/A  . Years of education: N/A   Occupational History  . Not on file.   Social History Main Topics  . Smoking status: Current Every Day Smoker    Packs/day: 1.00  . Smokeless tobacco: Never Used  . Alcohol use No     Comment: RARELY  . Drug use: No  . Sexual activity: Not Currently   Other Topics Concern  . Not on file   Social History Narrative  . No narrative on file    Past Surgical History:  Procedure Laterality Date  . CESAREAN SECTION  1987  . CYSTOSCOPY    . VAGINAL HYSTERECTOMY  1997   TOTAL VAGINAL    Family History  Problem Relation Age of Onset  . Heart failure Mother   . Cancer Father     MELENOMA  . Hypertension Sister   . Hyperlipidemia Sister   . Hyperlipidemia Brother   . Hypertension Brother   . Colon cancer Neg Hx   . Esophageal cancer Neg Hx   . Stomach cancer Neg Hx   . Rectal cancer Neg Hx       Medication List       Accurate as of 12/25/15  9:13 AM. Always use your  most recent med list.          Azelastine HCl 0.15 % Soln Commonly known as:  ASTEPRO Place 2 sprays into the nose 2 (two) times daily.   diazepam 5 MG tablet Commonly known as:  VALIUM Take 1 tablet (5 mg total) by mouth every 12 (twelve) hours as needed for anxiety.   fluticasone 50 MCG/ACT nasal spray Commonly known as:  FLONASE Place 2 sprays into the nose daily.   lisinopril-hydrochlorothiazide 20-12.5 MG tablet Commonly known as:  ZESTORETIC Take 1 tablet by mouth daily.   pantoprazole 40 MG tablet Commonly known as:  PROTONIX Take 1 tablet (40 mg total) by mouth daily.   pravastatin 20 MG tablet Commonly known as:  PRAVACHOL Take 20 mg by mouth daily.   traZODone 100 MG tablet Commonly known as:  DESYREL Take 1 tablet (100 mg total) by mouth at bedtime.          Objective:    BP 124/88   Pulse 79    Temp 98 F (36.7 C) (Oral)   Ht 5\' 5"  (1.651 m)   Wt 199 lb (90.3 kg)   BMI 33.12 kg/m   Allergies  Allergen Reactions  . Cortisporin [Bacitra-Neomycin-Polymyxin-Hc]    Wt Readings from Last 3 Encounters:  12/25/15 199 lb (90.3 kg)  01/30/14 198 lb (89.8 kg)  11/06/13 205 lb (93 kg)    Physical Exam  Constitutional: She is oriented to person, place, and time. She appears well-developed and well-nourished.  HENT:  Head: Normocephalic and atraumatic.  Eyes: Conjunctivae and EOM are normal. Pupils are equal, round, and reactive to light.  Neck: Normal range of motion. Neck supple.  Cardiovascular: Normal rate, regular rhythm, normal heart sounds and intact distal pulses.   Pulmonary/Chest: Effort normal and breath sounds normal.  Abdominal: Soft. Bowel sounds are normal.  Neurological: She is alert and oriented to person, place, and time. She has normal reflexes.  Skin: Skin is warm and dry. No rash noted.  Psychiatric: She has a normal mood and affect. Her behavior is normal. Judgment and thought content normal.        Assessment & Plan:   1. Gastroesophageal reflux disease with esophagitis protonix 40 mg one daily  2. Essential hypertension Lisinopril/hctz 20/12.5 one daily  3. Motion sickness, subsequent encounter Valium 5 mg 1 every 12 hours  4. Purulent rhinitis - Azelastine HCl (ASTEPRO) 0.15 % SOLN; Place 2 sprays into the nose 2 (two) times daily.  Dispense: 1 mL; Refill: 11   Continue all other maintenance medications as listed above. Educational handout given for GERD   Follow up plan: Return in about 6 months (around 06/23/2016).  Terald Sleeper PA-C [facility] [address]  [place], [place] [address] [phone]   12/25/2015, 9:13 AM

## 2015-12-30 ENCOUNTER — Encounter: Payer: Self-pay | Admitting: Family

## 2015-12-30 ENCOUNTER — Ambulatory Visit: Payer: Self-pay | Admitting: Nurse Practitioner

## 2015-12-30 ENCOUNTER — Other Ambulatory Visit: Payer: Self-pay | Admitting: Physician Assistant

## 2015-12-30 ENCOUNTER — Ambulatory Visit (INDEPENDENT_AMBULATORY_CARE_PROVIDER_SITE_OTHER): Payer: Self-pay | Admitting: Family

## 2015-12-30 VITALS — BP 133/80 | HR 77 | Temp 97.7°F | Ht 65.0 in | Wt 199.0 lb

## 2015-12-30 DIAGNOSIS — J0100 Acute maxillary sinusitis, unspecified: Secondary | ICD-10-CM

## 2015-12-30 MED ORDER — PREDNISONE 10 MG (21) PO TBPK: ORAL_TABLET | ORAL | 0 refills | Status: DC

## 2015-12-30 MED ORDER — AMOXICILLIN-POT CLAVULANATE 875-125 MG PO TABS: 1.0000 | ORAL_TABLET | Freq: Two times a day (BID) | ORAL | 0 refills | Status: DC

## 2015-12-30 NOTE — Telephone Encounter (Signed)
Patient notified, appointment made today with MMM at 2:30 pm.

## 2015-12-30 NOTE — Telephone Encounter (Signed)
Patient is going out of town and states she has a sinus infection and would like antibiotic

## 2015-12-30 NOTE — Progress Notes (Signed)
Subjective:    Patient ID: Whitney Ross, female    DOB: 1955/10/21, 60 y.o.   MRN: [id number]  Pt presents to the office today with sinus problem, but is leaving to [place] in the morning.  Sinus Problem  This is a new problem. The current episode started yesterday. The problem has been waxing and waning since onset. There has been no fever. Her pain is at a severity of 10/10. The pain is moderate. Associated symptoms include congestion, ear pain ("a little"), headaches, sinus pressure and sneezing. Pertinent negatives include no coughing, hoarse voice or sore throat. Past treatments include acetaminophen and spray decongestants. The treatment provided mild relief.      Review of Systems  HENT: Positive for congestion, ear pain ("a little"), sinus pressure and sneezing. Negative for hoarse voice and sore throat.   Respiratory: Negative for cough.   Neurological: Positive for headaches.  All other systems reviewed and are negative.      Objective:   Physical Exam  Constitutional: She is oriented to person, place, and time. She appears well-developed and well-nourished. No distress.  HENT:  Head: Normocephalic and atraumatic.  Right Ear: External ear normal.  Left Ear: External ear normal.  Nose: Mucosal edema and rhinorrhea present. Left sinus exhibits maxillary sinus tenderness and frontal sinus tenderness.  Mouth/Throat: Posterior oropharyngeal erythema present.  Eyes: Pupils are equal, round, and reactive to light.  Neck: Normal range of motion. Neck supple. No thyromegaly present.  Cardiovascular: Normal rate, regular rhythm, normal heart sounds and intact distal pulses.   No murmur heard. Pulmonary/Chest: Effort normal and breath sounds normal. No respiratory distress. She has no wheezes.  Abdominal: Soft. Bowel sounds are normal. She exhibits no distension. There is no tenderness.  Musculoskeletal: Normal range of motion. She exhibits no edema or tenderness.    Neurological: She is alert and oriented to person, place, and time. She has normal reflexes. No cranial nerve deficit.  Skin: Skin is warm and dry.  Psychiatric: She has a normal mood and affect. Her behavior is normal. Judgment and thought content normal.  Vitals reviewed.     BP 133/80   Pulse 77   Temp 97.7 F (36.5 C) (Oral)   Ht 5\' 5"  (1.651 m)   Wt 199 lb (90.3 kg)   BMI 33.12 kg/m      Assessment & Plan:  1. Acute maxillary sinusitis, recurrence not specified -- Take meds as prescribed - Use a cool mist humidifier  -Use saline nose sprays frequently -Saline irrigations of the nose can be very helpful if done frequently.  * 4X daily for 1 week*  * Use of a nettie pot can be helpful with this. Follow directions with this* -Force fluids -For any cough or congestion  Use plain Mucinex- regular strength or max strength is fine   * Children- consult with Pharmacist for dosing -For fever or aces or pains- take tylenol or ibuprofen appropriate for age and weight.  * for fevers greater than 101 orally you may alternate ibuprofen and tylenol every  3 hours. -Throat lozenges if help -Will give pt rx for augmentin, but pt not to start for 3-4 days to see if symptoms resolve. PT traveling out of state on airplane could cause increase sinus pain. RTO prn - amoxicillin-clavulanate (AUGMENTIN) 875-125 MG tablet; Take 1 tablet by mouth 2 (two) times daily.  Dispense: 14 tablet; Refill: 0 - predniSONE (STERAPRED UNI-PAK 21 TAB) 10 MG (21) TBPK tablet; Use as directed  Dispense: 21 tablet; Refill: 0  Evelina Dun, FNP

## 2015-12-30 NOTE — Patient Instructions (Signed)

## 2015-12-30 NOTE — Telephone Encounter (Signed)
Needs to be seen.   Laroy Apple, MD [facility] 12/30/2015, 9:33 AM

## 2016-01-27 ENCOUNTER — Ambulatory Visit (INDEPENDENT_AMBULATORY_CARE_PROVIDER_SITE_OTHER): Payer: Self-pay | Admitting: Physician Assistant

## 2016-01-27 ENCOUNTER — Encounter: Payer: Self-pay | Admitting: Physician Assistant

## 2016-01-27 VITALS — BP 118/75 | HR 77 | Temp 97.2°F | Ht 65.0 in | Wt 205.0 lb

## 2016-01-27 DIAGNOSIS — J0100 Acute maxillary sinusitis, unspecified: Secondary | ICD-10-CM

## 2016-01-27 DIAGNOSIS — J029 Acute pharyngitis, unspecified: Secondary | ICD-10-CM

## 2016-01-27 DIAGNOSIS — J209 Acute bronchitis, unspecified: Secondary | ICD-10-CM

## 2016-01-27 MED ORDER — AMOXICILLIN-POT CLAVULANATE 875-125 MG PO TABS
1.0000 | ORAL_TABLET | Freq: Two times a day (BID) | ORAL | 0 refills | Status: DC
Start: 2016-01-27 — End: 2016-06-23

## 2016-01-27 MED ORDER — PREDNISONE 10 MG (21) PO TBPK: ORAL_TABLET | ORAL | 1 refills | Status: DC

## 2016-01-27 NOTE — Progress Notes (Signed)
BP 118/75   Pulse 77   Temp 97.2 F (36.2 C) (Oral)   Ht 5\' 5"  (1.651 m)   Wt 205 lb (93 kg)   BMI 34.11 kg/m    Subjective:    Patient ID: Whitney Ross, female    DOB: Oct 24, 1955, 59 y.o.   MRN: [id number]  HPI: Whitney Ross is a 60 y.o. female presenting on 01/27/2016 for Cough; Nasal Congestion; and head congestion  Patient has had greater than one week worsening of symptoms. Cervix a cold and sore throat. It progressed into significant sinus drainage. She is having severe headache and facial pain over her maxillary sinuses. She has green mucus, she denies blood. She is having increased cough. She denies wheezing. There are no nausea vomiting or diarrhea.  Relevant past medical, surgical, family and social history reviewed and updated as indicated. Allergies and medications reviewed and updated.  Past Medical History:  Diagnosis Date  . Chronic kidney disease    hx muscle stones  . Elevated cholesterol   . Hypertension   . Lumbago   . Neck pain   . Nephrolithiasis     Past Surgical History:  Procedure Laterality Date  . CESAREAN SECTION  1987  . CYSTOSCOPY    . VAGINAL HYSTERECTOMY  1997   TOTAL VAGINAL    Review of Systems    Medication List       Accurate as of 01/27/16  9:41 PM. Always use your most recent med list.          amoxicillin-clavulanate 875-125 MG tablet Commonly known as:  AUGMENTIN Take 1 tablet by mouth 2 (two) times daily.   Azelastine HCl 0.15 % Soln Commonly known as:  ASTEPRO Place 2 sprays into the nose 2 (two) times daily.   diazepam 5 MG tablet Commonly known as:  VALIUM Take 1 tablet (5 mg total) by mouth every 12 (twelve) hours as needed for anxiety.   fluticasone 50 MCG/ACT nasal spray Commonly known as:  FLONASE Place 2 sprays into the nose daily.   lisinopril-hydrochlorothiazide 20-12.5 MG tablet Commonly known as:  ZESTORETIC Take 1 tablet by mouth daily.   pantoprazole 40 MG tablet Commonly known as:   PROTONIX Take 1 tablet (40 mg total) by mouth daily.   pravastatin 20 MG tablet Commonly known as:  PRAVACHOL Take 20 mg by mouth daily.   predniSONE 10 MG (21) Tbpk tablet Commonly known as:  STERAPRED UNI-PAK 21 TAB Use as directed   traZODone 100 MG tablet Commonly known as:  DESYREL Take 1 tablet (100 mg total) by mouth at bedtime.          Objective:    BP 118/75   Pulse 77   Temp 97.2 F (36.2 C) (Oral)   Ht 5\' 5"  (1.651 m)   Wt 205 lb (93 kg)   BMI 34.11 kg/m   Allergies  Allergen Reactions  . Cortisporin [Bacitra-Neomycin-Polymyxin-Hc]     Physical Exam  Constitutional: She is oriented to person, place, and time. She appears well-developed and well-nourished.  HENT:  Head: Normocephalic and atraumatic.  Right Ear: Tympanic membrane and external ear normal. No middle ear effusion.  Left Ear: Tympanic membrane and external ear normal.  No middle ear effusion.  Nose: Mucosal edema and rhinorrhea present. Right sinus exhibits no maxillary sinus tenderness. Left sinus exhibits no maxillary sinus tenderness.  Mouth/Throat: Uvula is midline. Posterior oropharyngeal erythema present.  Eyes: Conjunctivae and EOM are normal. Pupils are equal, round, and  reactive to light. Right eye exhibits no discharge. Left eye exhibits no discharge.  Neck: Normal range of motion.  Cardiovascular: Normal rate, regular rhythm and normal heart sounds.   Pulmonary/Chest: Effort normal and breath sounds normal. No respiratory distress. She has no wheezes.  Abdominal: Soft.  Lymphadenopathy:    She has no cervical adenopathy.  Neurological: She is alert and oriented to person, place, and time.  Skin: Skin is warm and dry.  Psychiatric: She has a normal mood and affect.  Nursing note and vitals reviewed.       Assessment & Plan:   1. Acute pharyngitis, unspecified etiology  2. Acute bronchitis, unspecified organism  3. Acute maxillary sinusitis, recurrence not specified -  predniSONE (STERAPRED UNI-PAK 21 TAB) 10 MG (21) TBPK tablet; Use as directed  Dispense: 21 tablet; Refill: 1 - amoxicillin-clavulanate (AUGMENTIN) 875-125 MG tablet; Take 1 tablet by mouth 2 (two) times daily.  Dispense: 20 tablet; Refill: 0   Continue all other maintenance medications as listed above.  Follow up plan: Return if symptoms worsen or fail to improve.  Educational handout given for sinusitis  Terald Sleeper PA-C [facility] [address]  [place], [place] [address] [phone]   01/27/2016, 9:41 PM

## 2016-01-27 NOTE — Patient Instructions (Signed)

## 2016-01-30 ENCOUNTER — Telehealth: Payer: Self-pay | Admitting: Physician Assistant

## 2016-01-30 ENCOUNTER — Encounter: Payer: Self-pay | Admitting: Physician Assistant

## 2016-01-30 MED ORDER — BENZONATATE 100 MG PO CAPS: 200.0000 mg | ORAL_CAPSULE | Freq: Three times a day (TID) | ORAL | 11 refills | Status: DC | PRN

## 2016-01-30 NOTE — Telephone Encounter (Signed)
Sent refill

## 2016-02-06 ENCOUNTER — Ambulatory Visit (INDEPENDENT_AMBULATORY_CARE_PROVIDER_SITE_OTHER): Payer: Self-pay | Admitting: Physician Assistant

## 2016-02-06 ENCOUNTER — Encounter: Payer: Self-pay | Admitting: Physician Assistant

## 2016-02-06 VITALS — BP 119/70 | HR 70 | Temp 98.1°F | Ht 65.0 in | Wt 203.2 lb

## 2016-02-06 DIAGNOSIS — R05 Cough: Secondary | ICD-10-CM

## 2016-02-06 DIAGNOSIS — R0789 Other chest pain: Secondary | ICD-10-CM

## 2016-02-06 DIAGNOSIS — R059 Cough, unspecified: Secondary | ICD-10-CM

## 2016-02-06 MED ORDER — FLUCONAZOLE 150 MG PO TABS: 150.0000 mg | ORAL_TABLET | Freq: Once | ORAL | 0 refills | Status: AC

## 2016-02-06 NOTE — Patient Instructions (Signed)

## 2016-02-06 NOTE — Progress Notes (Signed)
BP 119/70   Pulse 70   Temp 98.1 F (36.7 C) (Oral)   Ht 5\' 5"  (1.651 m)   Wt 203 lb 3.2 oz (92.2 kg)   BMI 33.81 kg/m    Subjective:    Patient ID: Whitney Ross, female    DOB: 11/08/1955, 60 y.o.   MRN: [id number]  HPI: Whitney Ross is a 60 y.o. female presenting on 02/06/2016 for Cough and Chest Pain  Patient has been improving with her bronchitis. However 2 nights ago she had a severe amount of chest wall pain on the right side. It hurts with deep breaths. It hurt with movement. She denies any fever or chills at this time. She is not having any significant expectoration at this time.  Relevant past medical, surgical, family and social history reviewed and updated as indicated. Allergies and medications reviewed and updated.  Past Medical History:  Diagnosis Date  . Chronic kidney disease    hx muscle stones  . Elevated cholesterol   . Hypertension   . Lumbago   . Neck pain   . Nephrolithiasis     Past Surgical History:  Procedure Laterality Date  . CESAREAN SECTION  1987  . CYSTOSCOPY    . VAGINAL HYSTERECTOMY  1997   TOTAL VAGINAL    Review of Systems  Constitutional: Negative for activity change, appetite change, chills, fatigue and fever.  HENT: Positive for congestion, postnasal drip and sore throat.   Eyes: Negative.   Respiratory: Positive for cough. Negative for wheezing.   Cardiovascular: Negative.  Negative for chest pain, palpitations and leg swelling.  Gastrointestinal: Negative.   Genitourinary: Negative.   Musculoskeletal: Negative.   Skin: Negative.   Neurological: Negative for headaches.    Allergies as of 02/06/2016      Reactions   Cortisporin [bacitra-neomycin-polymyxin-hc]       Medication List       Accurate as of 02/06/16  9:39 AM. Always use your most recent med list.          amoxicillin-clavulanate 875-125 MG tablet Commonly known as:  AUGMENTIN Take 1 tablet by mouth 2 (two) times daily.   Azelastine HCl 0.15  % Soln Commonly known as:  ASTEPRO Place 2 sprays into the nose 2 (two) times daily.   benzonatate 100 MG capsule Commonly known as:  TESSALON Take 2 capsules (200 mg total) by mouth 3 (three) times daily as needed for cough.   diazepam 5 MG tablet Commonly known as:  VALIUM Take 1 tablet (5 mg total) by mouth every 12 (twelve) hours as needed for anxiety.   fluconazole 150 MG tablet Commonly known as:  DIFLUCAN Take 1 tablet (150 mg total) by mouth once.   fluticasone 50 MCG/ACT nasal spray Commonly known as:  FLONASE Place 2 sprays into the nose daily.   lisinopril-hydrochlorothiazide 20-12.5 MG tablet Commonly known as:  ZESTORETIC Take 1 tablet by mouth daily.   pantoprazole 40 MG tablet Commonly known as:  PROTONIX Take 1 tablet (40 mg total) by mouth daily.   pravastatin 20 MG tablet Commonly known as:  PRAVACHOL Take 20 mg by mouth daily.   predniSONE 10 MG (21) Tbpk tablet Commonly known as:  STERAPRED UNI-PAK 21 TAB Use as directed   traZODone 100 MG tablet Commonly known as:  DESYREL Take 1 tablet (100 mg total) by mouth at bedtime.          Objective:    BP 119/70   Pulse 70  Temp 98.1 F (36.7 C) (Oral)   Ht 5\' 5"  (1.651 m)   Wt 203 lb 3.2 oz (92.2 kg)   BMI 33.81 kg/m   Allergies  Allergen Reactions  . Cortisporin [Bacitra-Neomycin-Polymyxin-Hc]     Physical Exam  Constitutional: She is oriented to person, place, and time. She appears well-developed and well-nourished.  HENT:  Head: Normocephalic and atraumatic.  Right Ear: Tympanic membrane is not erythematous. No middle ear effusion.  Left Ear: Tympanic membrane is not erythematous.  No middle ear effusion.  Nose: Mucosal edema present. Right sinus exhibits no frontal sinus tenderness. Left sinus exhibits no frontal sinus tenderness.  Mouth/Throat: Posterior oropharyngeal erythema present. No oropharyngeal exudate or tonsillar abscesses.  Eyes: Conjunctivae and EOM are normal. Pupils  are equal, round, and reactive to light.  Neck: Normal range of motion.  Cardiovascular: Normal rate, regular rhythm, normal heart sounds and intact distal pulses.   Pulmonary/Chest: Effort normal and breath sounds normal. She has no wheezes.  Abdominal: Soft. Bowel sounds are normal.  Neurological: She is alert and oriented to person, place, and time. She has normal reflexes.  Skin: Skin is warm and dry. No rash noted.  Psychiatric: She has a normal mood and affect. Her behavior is normal. Judgment and thought content normal.  Nursing note and vitals reviewed.       Assessment & Plan:   1. Right-sided chest wall pain Continue with cough medication. She has Tessalon. He is at bedtime.  2. Cough Tessalon Afrin nasal spray for 3 days then stop Sudafed, sign for the kind at the counter.   Continue all other maintenance medications as listed above.  Follow up plan: Follow-up as needed or worsening of symptoms. Call office for any issues.  Educational handout given for bronchitis  Terald Sleeper PA-C [facility] [address]  [place], [place] [address] [phone]   02/06/2016, 9:39 AM

## 2016-06-23 ENCOUNTER — Encounter: Payer: Self-pay | Admitting: Physician Assistant

## 2016-06-23 ENCOUNTER — Ambulatory Visit (INDEPENDENT_AMBULATORY_CARE_PROVIDER_SITE_OTHER): Payer: Self-pay | Admitting: Physician Assistant

## 2016-06-23 VITALS — BP 123/88 | HR 72 | Temp 97.5°F | Ht 65.0 in | Wt 201.4 lb

## 2016-06-23 DIAGNOSIS — E7800 Pure hypercholesterolemia, unspecified: Secondary | ICD-10-CM

## 2016-06-23 DIAGNOSIS — K219 Gastro-esophageal reflux disease without esophagitis: Secondary | ICD-10-CM

## 2016-06-23 DIAGNOSIS — Z0000 Encounter for general adult medical examination without abnormal findings: Secondary | ICD-10-CM

## 2016-06-23 DIAGNOSIS — F5101 Primary insomnia: Secondary | ICD-10-CM

## 2016-06-23 DIAGNOSIS — J310 Chronic rhinitis: Secondary | ICD-10-CM

## 2016-06-23 MED ORDER — PRAVASTATIN SODIUM 20 MG PO TABS: 20.0000 mg | ORAL_TABLET | Freq: Every day | ORAL | 3 refills | Status: DC

## 2016-06-23 MED ORDER — PANTOPRAZOLE SODIUM 40 MG PO TBEC: 40.0000 mg | DELAYED_RELEASE_TABLET | Freq: Every day | ORAL | 3 refills | Status: DC

## 2016-06-23 MED ORDER — FLUTICASONE PROPIONATE 50 MCG/ACT NA SUSP: 2.0000 | Freq: Every day | NASAL | 11 refills | Status: DC

## 2016-06-23 MED ORDER — ALPRAZOLAM 0.5 MG PO TABS: 0.5000 mg | ORAL_TABLET | Freq: Every evening | ORAL | 1 refills | Status: DC | PRN

## 2016-06-23 MED ORDER — LISINOPRIL-HYDROCHLOROTHIAZIDE 20-12.5 MG PO TABS
1.0000 | ORAL_TABLET | Freq: Every day | ORAL | 3 refills | Status: DC
Start: 2016-06-23 — End: 2017-06-29

## 2016-06-23 NOTE — Patient Instructions (Signed)
Insomnia Insomnia is a sleep disorder that makes it difficult to fall asleep or to stay asleep. Insomnia can cause tiredness (fatigue), low energy, difficulty concentrating, mood swings, and poor performance at work or school. There are three different ways to classify insomnia:  Difficulty falling asleep.  Difficulty staying asleep.  Waking up too early in the morning. Any type of insomnia can be long-term (chronic) or short-term (acute). Both are common. Short-term insomnia usually lasts for three months or less. Chronic insomnia occurs at least three times a week for longer than three months. What are the causes? Insomnia may be caused by another condition, situation, or substance, such as:  Anxiety.  Certain medicines.  Gastroesophageal reflux disease (GERD) or other gastrointestinal conditions.  Asthma or other breathing conditions.  Restless legs syndrome, sleep apnea, or other sleep disorders.  Chronic pain.  Menopause. This may include hot flashes.  Stroke.  Abuse of alcohol, tobacco, or illegal drugs.  Depression.  Caffeine.  Neurological disorders, such as Alzheimer disease.  An overactive thyroid (hyperthyroidism). The cause of insomnia may not be known. What increases the risk? Risk factors for insomnia include:  Gender. Women are more commonly affected than men.  Age. Insomnia is more common as you get older.  Stress. This may involve your professional or personal life.  Income. Insomnia is more common in people with lower income.  Lack of exercise.  Irregular work schedule or night shifts.  Traveling between different time zones. What are the signs or symptoms? If you have insomnia, trouble falling asleep or trouble staying asleep is the main symptom. This may lead to other symptoms, such as:  Feeling fatigued.  Feeling nervous about going to sleep.  Not feeling rested in the morning.  Having trouble concentrating.  Feeling irritable,  anxious, or depressed. How is this treated? Treatment for insomnia depends on the cause. If your insomnia is caused by an underlying condition, treatment will focus on addressing the condition. Treatment may also include:  Medicines to help you sleep.  Counseling or therapy.  Lifestyle adjustments. Follow these instructions at home:  Take medicines only as directed by your health care provider.  Keep regular sleeping and waking hours. Avoid naps.  Keep a sleep diary to help you and your health care provider figure out what could be causing your insomnia. Include:  When you sleep.  When you wake up during the night.  How well you sleep.  How rested you feel the next day.  Any side effects of medicines you are taking.  What you eat and drink.  Make your bedroom a comfortable place where it is easy to fall asleep:  Put up shades or special blackout curtains to block light from outside.  Use a white noise machine to block noise.  Keep the temperature cool.  Exercise regularly as directed by your health care provider. Avoid exercising right before bedtime.  Use relaxation techniques to manage stress. Ask your health care provider to suggest some techniques that may work well for you. These may include:  Breathing exercises.  Routines to release muscle tension.  Visualizing peaceful scenes.  Cut back on alcohol, caffeinated beverages, and cigarettes, especially close to bedtime. These can disrupt your sleep.  Do not overeat or eat spicy foods right before bedtime. This can lead to digestive discomfort that can make it hard for you to sleep.  Limit screen use before bedtime. This includes:  Watching TV.  Using your smartphone, tablet, and computer.  Stick to a   routine. This can help you fall asleep faster. Try to do a quiet activity, brush your teeth, and go to bed at the same time each night.  Get out of bed if you are still awake after 15 minutes of trying to  sleep. Keep the lights down, but try reading or doing a quiet activity. When you feel sleepy, go back to bed.  Make sure that you drive carefully. Avoid driving if you feel very sleepy.  Keep all follow-up appointments as directed by your health care provider. This is important. Contact a health care provider if:  You are tired throughout the day or have trouble in your daily routine due to sleepiness.  You continue to have sleep problems or your sleep problems get worse. Get help right away if:  You have serious thoughts about hurting yourself or someone else. This information is not intended to replace advice given to you by your health care provider. Make sure you discuss any questions you have with your health care provider. Document Released: 02/06/2000 Document Revised: 07/11/2015 Document Reviewed: 11/09/2013 Elsevier Interactive Patient Education  2017 [employer]  

## 2016-06-24 DIAGNOSIS — F5101 Primary insomnia: Secondary | ICD-10-CM | POA: Insufficient documentation

## 2016-06-24 LAB — LIPID PANEL
CHOL/HDL RATIO: 2.9 ratio (ref 0.0–4.4)
Cholesterol, Total: 197 mg/dL (ref 100–199)
HDL: 67 mg/dL (ref >39)
LDL Calculated: 113 mg/dL — ABNORMAL HIGH (ref 0–99)
Triglycerides: 84 mg/dL (ref 0–149)
VLDL Cholesterol Cal: 17 mg/dL (ref 5–40)

## 2016-06-24 LAB — CMP14+EGFR
A/G RATIO: 1.8 (ref 1.2–2.2)
ALT: 17 IU/L (ref 0–32)
AST: 18 IU/L (ref 0–40)
Albumin: 4.4 g/dL (ref 3.5–5.5)
Alkaline Phosphatase: 56 IU/L (ref 39–117)
BILIRUBIN TOTAL: 0.5 mg/dL (ref 0.0–1.2)
BUN/Creatinine Ratio: 18 (ref 9–23)
BUN: 14 mg/dL (ref 6–24)
CALCIUM: 9.5 mg/dL (ref 8.7–10.2)
CHLORIDE: 99 mmol/L (ref 96–106)
CO2: 28 mmol/L (ref 18–29)
Creatinine, Ser: 0.79 mg/dL (ref 0.57–1.00)
GFR, EST AFRICAN AMERICAN: 95 mL/min/{1.73_m2} (ref >59)
GFR, EST NON AFRICAN AMERICAN: 83 mL/min/{1.73_m2} (ref >59)
GLOBULIN, TOTAL: 2.5 g/dL (ref 1.5–4.5)
Glucose: 94 mg/dL (ref 65–99)
POTASSIUM: 4.1 mmol/L (ref 3.5–5.2)
SODIUM: 140 mmol/L (ref 134–144)
TOTAL PROTEIN: 6.9 g/dL (ref 6.0–8.5)

## 2016-06-24 LAB — CBC WITH DIFFERENTIAL/PLATELET
Basophils Absolute: 0 10*3/uL (ref 0.0–0.2)
Basos: 1 %
EOS (ABSOLUTE): 0.1 10*3/uL (ref 0.0–0.4)
EOS: 2 %
HEMOGLOBIN: 13.7 g/dL (ref 11.1–15.9)
Hematocrit: 41.4 % (ref 34.0–46.6)
IMMATURE GRANS (ABS): 0 10*3/uL (ref 0.0–0.1)
IMMATURE GRANULOCYTES: 0 %
LYMPHS: 36 %
Lymphocytes Absolute: 2.1 10*3/uL (ref 0.7–3.1)
MCH: 30.6 pg (ref 26.6–33.0)
MCHC: 33.1 g/dL (ref 31.5–35.7)
MCV: 93 fL (ref 79–97)
MONOCYTES: 7 %
Monocytes Absolute: 0.4 10*3/uL (ref 0.1–0.9)
NEUTROS PCT: 54 %
Neutrophils Absolute: 3.1 10*3/uL (ref 1.4–7.0)
PLATELETS: 294 10*3/uL (ref 150–379)
RBC: 4.47 x10E6/uL (ref 3.77–5.28)
RDW: 13.5 % (ref 12.3–15.4)
WBC: 5.7 10*3/uL (ref 3.4–10.8)

## 2016-06-24 NOTE — Progress Notes (Signed)
BP 123/88   Pulse 72   Temp 97.5 F (36.4 C) (Oral)   Ht 5' 5" (1.651 m)   Wt 201 lb 6.4 oz (91.4 kg)   BMI 33.51 kg/m    Subjective:    Patient ID: Whitney Ross, female    DOB: 06/08/1955, 61 y.o.   MRN: [id number]  HPI: Whitney Ross is a 61 y.o. female presenting on 06/23/2016 for Hyperlipidemia (6 month recheck) and Hypertension  This patient comes in for periodic recheck on medications and conditions including depression, insomnia, hyperlipidemia, GERD.   All medications are reviewed today. There are no reports of any problems with the medications. All of the medical conditions are reviewed and updated.  Lab work is reviewed and will be ordered as medically necessary. There are no new problems reported with today's visit.   Relevant past medical, surgical, family and social history reviewed and updated as indicated. Allergies and medications reviewed and updated.  Past Medical History:  Diagnosis Date  . Chronic kidney disease    hx muscle stones  . Elevated cholesterol   . Hypertension   . Lumbago   . Neck pain   . Nephrolithiasis     Past Surgical History:  Procedure Laterality Date  . CESAREAN SECTION  1987  . CYSTOSCOPY    . VAGINAL HYSTERECTOMY  1997   TOTAL VAGINAL    Review of Systems  Constitutional: Negative.  Negative for activity change, fatigue and fever.  HENT: Negative.   Eyes: Negative.   Respiratory: Negative.  Negative for cough.   Cardiovascular: Negative.  Negative for chest pain.  Gastrointestinal: Negative.  Negative for abdominal pain.  Endocrine: Negative.   Genitourinary: Negative.  Negative for dysuria.  Musculoskeletal: Negative.   Skin: Negative.   Neurological: Negative.     Allergies as of 06/23/2016      Reactions   Cortisporin [bacitra-neomycin-polymyxin-hc]       Medication List       Accurate as of 06/23/16 11:59 PM. Always use your most recent med list.          ALPRAZolam 0.5 MG tablet Commonly known as:   XANAX Take 1 tablet (0.5 mg total) by mouth at bedtime as needed for anxiety.   Azelastine HCl 0.15 % Soln Commonly known as:  ASTEPRO Place 2 sprays into the nose 2 (two) times daily.   diazepam 5 MG tablet Commonly known as:  VALIUM Take 1 tablet (5 mg total) by mouth every 12 (twelve) hours as needed for anxiety.   fluticasone 50 MCG/ACT nasal spray Commonly known as:  FLONASE Place 2 sprays into both nostrils daily.   lisinopril-hydrochlorothiazide 20-12.5 MG tablet Commonly known as:  ZESTORETIC Take 1 tablet by mouth daily.   pantoprazole 40 MG tablet Commonly known as:  PROTONIX Take 1 tablet (40 mg total) by mouth daily.   pravastatin 20 MG tablet Commonly known as:  PRAVACHOL Take 1 tablet (20 mg total) by mouth daily.          Objective:    BP 123/88   Pulse 72   Temp 97.5 F (36.4 C) (Oral)   Ht 5' 5" (1.651 m)   Wt 201 lb 6.4 oz (91.4 kg)   BMI 33.51 kg/m   Allergies  Allergen Reactions  . Cortisporin [Bacitra-Neomycin-Polymyxin-Hc]     Physical Exam  Constitutional: She is oriented to person, place, and time. She appears well-developed and well-nourished.  HENT:  Head: Normocephalic and atraumatic.  Right Ear: Tympanic  membrane, external ear and ear canal normal.  Left Ear: Tympanic membrane, external ear and ear canal normal.  Nose: Nose normal. No rhinorrhea.  Mouth/Throat: Oropharynx is clear and moist and mucous membranes are normal. No oropharyngeal exudate or posterior oropharyngeal erythema.  Eyes: Conjunctivae and EOM are normal. Pupils are equal, round, and reactive to light.  Neck: Normal range of motion. Neck supple.  Cardiovascular: Normal rate, regular rhythm, normal heart sounds and intact distal pulses.   Pulmonary/Chest: Effort normal and breath sounds normal.  Abdominal: Soft. Bowel sounds are normal.  Neurological: She is alert and oriented to person, place, and time. She has normal reflexes.  Skin: Skin is warm and dry. No  rash noted.  Psychiatric: She has a normal mood and affect. Her behavior is normal. Judgment and thought content normal.    Results for orders placed or performed in visit on 06/23/16  CMP14+EGFR  Result Value Ref Range   Glucose 94 65 - 99 mg/dL   BUN 14 6 - 24 mg/dL   Creatinine, Ser 0.79 0.57 - 1.00 mg/dL   GFR calc non Af Amer 83 >59 mL/min/1.73   GFR calc Af Amer 95 >59 mL/min/1.73   BUN/Creatinine Ratio 18 9 - 23   Sodium 140 134 - 144 mmol/L   Potassium 4.1 3.5 - 5.2 mmol/L   Chloride 99 96 - 106 mmol/L   CO2 28 18 - 29 mmol/L   Calcium 9.5 8.7 - 10.2 mg/dL   Total Protein 6.9 6.0 - 8.5 g/dL   Albumin 4.4 3.5 - 5.5 g/dL   Globulin, Total 2.5 1.5 - 4.5 g/dL   Albumin/Globulin Ratio 1.8 1.2 - 2.2   Bilirubin Total 0.5 0.0 - 1.2 mg/dL   Alkaline Phosphatase 56 39 - 117 IU/L   AST 18 0 - 40 IU/L   ALT 17 0 - 32 IU/L  Lipid panel  Result Value Ref Range   Cholesterol, Total 197 100 - 199 mg/dL   Triglycerides 84 0 - 149 mg/dL   HDL 67 >39 mg/dL   VLDL Cholesterol Cal 17 5 - 40 mg/dL   LDL Calculated 113 (H) 0 - 99 mg/dL   Chol/HDL Ratio 2.9 0.0 - 4.4 ratio  CBC with Differential/Platelet  Result Value Ref Range   WBC 5.7 3.4 - 10.8 x10E3/uL   RBC 4.47 3.77 - 5.28 x10E6/uL   Hemoglobin 13.7 11.1 - 15.9 g/dL   Hematocrit 41.4 34.0 - 46.6 %   MCV 93 79 - 97 fL   MCH 30.6 26.6 - 33.0 pg   MCHC 33.1 31.5 - 35.7 g/dL   RDW 13.5 12.3 - 15.4 %   Platelets 294 150 - 379 x10E3/uL   Neutrophils 54 Not Estab. %   Lymphs 36 Not Estab. %   Monocytes 7 Not Estab. %   Eos 2 Not Estab. %   Basos 1 Not Estab. %   Neutrophils Absolute 3.1 1.4 - 7.0 x10E3/uL   Lymphocytes Absolute 2.1 0.7 - 3.1 x10E3/uL   Monocytes Absolute 0.4 0.1 - 0.9 x10E3/uL   EOS (ABSOLUTE) 0.1 0.0 - 0.4 x10E3/uL   Basophils Absolute 0.0 0.0 - 0.2 x10E3/uL   Immature Granulocytes 0 Not Estab. %   Immature Grans (Abs) 0.0 0.0 - 0.1 x10E3/uL      Assessment & Plan:   1. Purulent rhinitis - fluticasone  (FLONASE) 50 MCG/ACT nasal spray; Place 2 sprays into both nostrils daily.  Dispense: 1 g; Refill: 11  2. Gastroesophageal reflux disease without esophagitis -  pantoprazole (PROTONIX) 40 MG tablet; Take 1 tablet (40 mg total) by mouth daily.  Dispense: 90 tablet; Refill: 3  3. Primary insomnia  4. Pure hypercholesterolemia - Lipid panel  5. Well adult exam - CMP14+EGFR - Lipid panel - CBC with Differential/Platelet   Current Outpatient Prescriptions:  .  Azelastine HCl (ASTEPRO) 0.15 % SOLN, Place 2 sprays into the nose 2 (two) times daily., Disp: 1 mL, Rfl: 11 .  diazepam (VALIUM) 5 MG tablet, Take 1 tablet (5 mg total) by mouth every 12 (twelve) hours as needed for anxiety., Disp: 30 tablet, Rfl: 1 .  fluticasone (FLONASE) 50 MCG/ACT nasal spray, Place 2 sprays into both nostrils daily., Disp: 1 g, Rfl: 11 .  lisinopril-hydrochlorothiazide (ZESTORETIC) 20-12.5 MG tablet, Take 1 tablet by mouth daily., Disp: 90 tablet, Rfl: 3 .  pantoprazole (PROTONIX) 40 MG tablet, Take 1 tablet (40 mg total) by mouth daily., Disp: 90 tablet, Rfl: 3 .  pravastatin (PRAVACHOL) 20 MG tablet, Take 1 tablet (20 mg total) by mouth daily., Disp: 90 tablet, Rfl: 3 .  ALPRAZolam (XANAX) 0.5 MG tablet, Take 1 tablet (0.5 mg total) by mouth at bedtime as needed for anxiety., Disp: 90 tablet, Rfl: 1  Continue all other maintenance medications as listed above.  Follow up plan: Return in about 6 months (around 12/24/2016) for recheck.  Educational handout given for insomnia  Terald Sleeper PA-C [facility] [address]  [place], [place] [address] [phone]   06/24/2016, 6:12 PM

## 2016-07-01 ENCOUNTER — Ambulatory Visit (INDEPENDENT_AMBULATORY_CARE_PROVIDER_SITE_OTHER): Payer: Self-pay | Admitting: Pediatrics

## 2016-07-01 ENCOUNTER — Encounter: Payer: Self-pay | Admitting: Pediatrics

## 2016-07-01 VITALS — BP 127/72 | HR 66 | Temp 97.7°F | Ht 65.0 in | Wt 202.0 lb

## 2016-07-01 DIAGNOSIS — W5501XA Bitten by cat, initial encounter: Secondary | ICD-10-CM

## 2016-07-01 DIAGNOSIS — S60921A Unspecified superficial injury of right hand, initial encounter: Secondary | ICD-10-CM

## 2016-07-01 MED ORDER — AMOXICILLIN-POT CLAVULANATE 875-125 MG PO TABS: 1.0000 | ORAL_TABLET | Freq: Two times a day (BID) | ORAL | 0 refills | Status: DC

## 2016-07-01 NOTE — Progress Notes (Signed)
  Subjective:   Patient ID: Whitney Ross, female    DOB: 1955/10/22, 61 y.o.   MRN: [id number] CC: Animal Bite (yesterday, right first finger)  HPI: Whitney Ross is a 61 y.o. female presenting for Animal Bite (yesterday, right first finger)  Has cat she rescued last year very afraid of her dog, cat got outside, dog coming up driveway and got scared, pt tried to pick up cat, she got clawed on L forearm and bite by the cat R lateral index finger  Slightly more pink and puffy right around bite today, slightly sore with bending finger No discharge  Relevant past medical, surgical, family and social history reviewed. Allergies and medications reviewed and updated. History  Smoking Status  . Current Every Day Smoker  . Packs/day: 1.00  Smokeless Tobacco  . Never Used   ROS: Per HPI   Objective:    BP 127/72   Pulse 66   Temp 97.7 F (36.5 C) (Oral)   Ht 5\' 5"  (1.651 m)   Wt 202 lb (91.6 kg)   BMI 33.61 kg/m   Wt Readings from Last 3 Encounters:  07/01/16 202 lb (91.6 kg)  06/23/16 201 lb 6.4 oz (91.4 kg)  02/06/16 203 lb 3.2 oz (92.2 kg)    Gen: NAD, alert, cooperative with exam, NCAT EYES: EOMI, no conjunctival injection, or no icterus CV: WWP Resp: normal WOB Ext: No edema, warm Neuro: Alert and oriented, strength equal b/l UE and LE, coordination grossly normal MSK: nl ROM R finger, slightly tight Skin: R 2nd finger with apprx 3-62mm raised pink papule, minimally tender with palpation, no opening, not draining, no open wound  Assessment & Plan:  [name] was seen today for animal bite.  Diagnoses and all orders for this visit:  Cat bite, initial encounter Not draining, already closed Pink papule over bite site Will treat with below given redness, tenderness Discussed return precautions -     amoxicillin-clavulanate (AUGMENTIN) 875-125 MG tablet; Take 1 tablet by mouth 2 (two) times daily.  Follow up plan: prn Assunta Found, MD [facility]

## 2016-10-05 ENCOUNTER — Other Ambulatory Visit: Payer: Self-pay | Admitting: Physician Assistant

## 2016-10-05 DIAGNOSIS — T753XXD Motion sickness, subsequent encounter: Secondary | ICD-10-CM

## 2016-10-05 NOTE — Telephone Encounter (Signed)
Last seen 07/01/16  Glenard Haring   If approved route to nurse to call into [facility]

## 2016-10-06 NOTE — Telephone Encounter (Signed)
rx called into pharmacy

## 2016-10-15 ENCOUNTER — Other Ambulatory Visit: Payer: Self-pay | Admitting: Physician Assistant

## 2016-10-15 ENCOUNTER — Telehealth: Payer: Self-pay | Admitting: *Deleted

## 2016-10-15 DIAGNOSIS — W5501XA Bitten by cat, initial encounter: Secondary | ICD-10-CM

## 2016-10-15 MED ORDER — PREDNISONE 10 MG (21) PO TBPK: ORAL_TABLET | ORAL | 0 refills | Status: DC

## 2016-10-15 MED ORDER — AMOXICILLIN-POT CLAVULANATE 875-125 MG PO TABS: 1.0000 | ORAL_TABLET | Freq: Two times a day (BID) | ORAL | 0 refills | Status: DC

## 2016-10-15 NOTE — Telephone Encounter (Signed)
Pt aware.

## 2016-10-15 NOTE — Telephone Encounter (Signed)
On vacation, ears and throat hurt badly. Also chest congestion. Please call something strong into [place]

## 2016-12-28 ENCOUNTER — Encounter: Payer: Self-pay | Admitting: Physician Assistant

## 2016-12-28 ENCOUNTER — Ambulatory Visit (INDEPENDENT_AMBULATORY_CARE_PROVIDER_SITE_OTHER): Payer: Self-pay | Admitting: Physician Assistant

## 2016-12-28 VITALS — BP 107/76 | HR 80 | Temp 97.7°F | Ht 65.0 in | Wt 206.6 lb

## 2016-12-28 DIAGNOSIS — T753XXA Motion sickness, initial encounter: Secondary | ICD-10-CM | POA: Insufficient documentation

## 2016-12-28 DIAGNOSIS — I10 Essential (primary) hypertension: Secondary | ICD-10-CM

## 2016-12-28 DIAGNOSIS — T753XXD Motion sickness, subsequent encounter: Secondary | ICD-10-CM

## 2016-12-28 DIAGNOSIS — K219 Gastro-esophageal reflux disease without esophagitis: Secondary | ICD-10-CM

## 2016-12-28 DIAGNOSIS — F5101 Primary insomnia: Secondary | ICD-10-CM

## 2016-12-28 MED ORDER — DIAZEPAM 5 MG PO TABS: ORAL_TABLET | ORAL | 1 refills | Status: DC

## 2016-12-28 MED ORDER — ALPRAZOLAM 0.5 MG PO TABS: 0.5000 mg | ORAL_TABLET | Freq: Every evening | ORAL | 1 refills | Status: DC | PRN

## 2016-12-28 MED ORDER — LORATADINE 10 MG PO TABS
10.0000 mg | ORAL_TABLET | Freq: Every day | ORAL | 3 refills | Status: DC
Start: 2016-12-28 — End: 2018-01-02

## 2016-12-28 NOTE — Progress Notes (Signed)
BP 107/76   Pulse 80   Temp 97.7 F (36.5 C) (Oral)   Ht '5\' 5"'  (1.651 m)   Wt 206 lb 9.6 oz (93.7 kg)   BMI 34.38 kg/m    Subjective:    Patient ID: Whitney Ross, female    DOB: Jun 27, 1955, 61 y.o.   MRN: [id number]  HPI: Whitney Ross is a 61 y.o. female presenting on 12/28/2016 for Follow-up (6 month )  This patient comes in for periodic recheck on medications and conditions including hypertension, GERD, primary insomnia, recurrent motion sickness.  Overall she has been quite stable.  She reports that she may be having some leg cramping and joint aches related to Pravachol.  She is going to try going off that is for the next month to see if it improves.  If it does not she can go back on it if it does we will reduce her dose.  All other statins that she has tried has given her the same thing otherwise she is doing well.  Medications will be refilled.    All medications are reviewed today. There are no reports of any problems with the medications. All of the medical conditions are reviewed and updated.  Lab work is reviewed and will be ordered as medically necessary. There are no new problems reported with today's visit.   Relevant past medical, surgical, family and social history reviewed and updated as indicated. Allergies and medications reviewed and updated.  Past Medical History:  Diagnosis Date  . Chronic kidney disease    hx muscle stones  . Elevated cholesterol   . Hypertension   . Lumbago   . Neck pain   . Nephrolithiasis     Past Surgical History:  Procedure Laterality Date  . CESAREAN SECTION  1987  . CYSTOSCOPY    . VAGINAL HYSTERECTOMY  1997   TOTAL VAGINAL    Review of Systems  Constitutional: Negative.  Negative for activity change, fatigue and fever.  HENT: Negative.   Eyes: Negative.   Respiratory: Negative.  Negative for cough.   Cardiovascular: Negative.  Negative for chest pain.  Gastrointestinal: Negative.  Negative for abdominal pain.    Endocrine: Negative.   Genitourinary: Negative.  Negative for dysuria.  Musculoskeletal: Positive for arthralgias and myalgias.  Skin: Negative.   Neurological: Negative.     Allergies as of 12/28/2016      Reactions   Cortisporin [bacitra-neomycin-polymyxin-hc]       Medication List        Accurate as of 12/28/16  8:32 AM. Always use your most recent med list.          ALPRAZolam 0.5 MG tablet Commonly known as:  XANAX Take 1 tablet (0.5 mg total) at bedtime as needed by mouth for anxiety.   Azelastine HCl 0.15 % Soln Commonly known as:  ASTEPRO Place 2 sprays into the nose 2 (two) times daily.   diazepam 5 MG tablet Commonly known as:  VALIUM TAKE ONE TABLET BY MOUTH EVERY 12 HOURS AS NEEDED FOR ANXIETY   fluticasone 50 MCG/ACT nasal spray Commonly known as:  FLONASE Place 2 sprays into both nostrils daily.   lisinopril-hydrochlorothiazide 20-12.5 MG tablet Commonly known as:  ZESTORETIC Take 1 tablet by mouth daily.   loratadine 10 MG tablet Commonly known as:  CLARITIN Take 1 tablet (10 mg total) daily by mouth.   pantoprazole 40 MG tablet Commonly known as:  PROTONIX Take 1 tablet (40 mg total) by mouth daily.  pravastatin 20 MG tablet Commonly known as:  PRAVACHOL Take 1 tablet (20 mg total) by mouth daily.          Objective:    BP 107/76   Pulse 80   Temp 97.7 F (36.5 C) (Oral)   Ht '5\' 5"'  (1.651 m)   Wt 206 lb 9.6 oz (93.7 kg)   BMI 34.38 kg/m   Allergies  Allergen Reactions  . Cortisporin [Bacitra-Neomycin-Polymyxin-Hc]     Physical Exam  Constitutional: She is oriented to person, place, and time. She appears well-developed and well-nourished.  HENT:  Head: Normocephalic and atraumatic.  Eyes: Conjunctivae and EOM are normal. Pupils are equal, round, and reactive to light.  Cardiovascular: Normal rate, regular rhythm, normal heart sounds and intact distal pulses.  Pulmonary/Chest: Effort normal and breath sounds normal.   Abdominal: Soft. Bowel sounds are normal.  Neurological: She is alert and oriented to person, place, and time. She has normal reflexes.  Skin: Skin is warm and dry. No rash noted.  Psychiatric: She has a normal mood and affect. Her behavior is normal. Judgment and thought content normal.  Nursing note and vitals reviewed.   Results for orders placed or performed in visit on 06/23/16  CMP14+EGFR  Result Value Ref Range   Glucose 94 65 - 99 mg/dL   BUN 14 6 - 24 mg/dL   Creatinine, Ser 0.79 0.57 - 1.00 mg/dL   GFR calc non Af Amer 83 >59 mL/min/1.73   GFR calc Af Amer 95 >59 mL/min/1.73   BUN/Creatinine Ratio 18 9 - 23   Sodium 140 134 - 144 mmol/L   Potassium 4.1 3.5 - 5.2 mmol/L   Chloride 99 96 - 106 mmol/L   CO2 28 18 - 29 mmol/L   Calcium 9.5 8.7 - 10.2 mg/dL   Total Protein 6.9 6.0 - 8.5 g/dL   Albumin 4.4 3.5 - 5.5 g/dL   Globulin, Total 2.5 1.5 - 4.5 g/dL   Albumin/Globulin Ratio 1.8 1.2 - 2.2   Bilirubin Total 0.5 0.0 - 1.2 mg/dL   Alkaline Phosphatase 56 39 - 117 IU/L   AST 18 0 - 40 IU/L   ALT 17 0 - 32 IU/L  Lipid panel  Result Value Ref Range   Cholesterol, Total 197 100 - 199 mg/dL   Triglycerides 84 0 - 149 mg/dL   HDL 67 >39 mg/dL   VLDL Cholesterol Cal 17 5 - 40 mg/dL   LDL Calculated 113 (H) 0 - 99 mg/dL   Chol/HDL Ratio 2.9 0.0 - 4.4 ratio  CBC with Differential/Platelet  Result Value Ref Range   WBC 5.7 3.4 - 10.8 x10E3/uL   RBC 4.47 3.77 - 5.28 x10E6/uL   Hemoglobin 13.7 11.1 - 15.9 g/dL   Hematocrit 41.4 34.0 - 46.6 %   MCV 93 79 - 97 fL   MCH 30.6 26.6 - 33.0 pg   MCHC 33.1 31.5 - 35.7 g/dL   RDW 13.5 12.3 - 15.4 %   Platelets 294 150 - 379 x10E3/uL   Neutrophils 54 Not Estab. %   Lymphs 36 Not Estab. %   Monocytes 7 Not Estab. %   Eos 2 Not Estab. %   Basos 1 Not Estab. %   Neutrophils Absolute 3.1 1.4 - 7.0 x10E3/uL   Lymphocytes Absolute 2.1 0.7 - 3.1 x10E3/uL   Monocytes Absolute 0.4 0.1 - 0.9 x10E3/uL   EOS (ABSOLUTE) 0.1 0.0 - 0.4  x10E3/uL   Basophils Absolute 0.0 0.0 - 0.2 x10E3/uL  Immature Granulocytes 0 Not Estab. %   Immature Grans (Abs) 0.0 0.0 - 0.1 x10E3/uL      Assessment & Plan:   1. Motion sickness, subsequent encounter - diazepam (VALIUM) 5 MG tablet; TAKE ONE TABLET BY MOUTH EVERY 12 HOURS AS NEEDED FOR ANXIETY  Dispense: 30 tablet; Refill: 1  2. Essential hypertension  3. Gastroesophageal reflux disease without esophagitis  4. Primary insomnia    Current Outpatient Medications:  .  ALPRAZolam (XANAX) 0.5 MG tablet, Take 1 tablet (0.5 mg total) at bedtime as needed by mouth for anxiety., Disp: 90 tablet, Rfl: 1 .  Azelastine HCl (ASTEPRO) 0.15 % SOLN, Place 2 sprays into the nose 2 (two) times daily., Disp: 1 mL, Rfl: 11 .  diazepam (VALIUM) 5 MG tablet, TAKE ONE TABLET BY MOUTH EVERY 12 HOURS AS NEEDED FOR ANXIETY, Disp: 30 tablet, Rfl: 1 .  fluticasone (FLONASE) 50 MCG/ACT nasal spray, Place 2 sprays into both nostrils daily., Disp: 1 g, Rfl: 11 .  lisinopril-hydrochlorothiazide (ZESTORETIC) 20-12.5 MG tablet, Take 1 tablet by mouth daily., Disp: 90 tablet, Rfl: 3 .  pantoprazole (PROTONIX) 40 MG tablet, Take 1 tablet (40 mg total) by mouth daily., Disp: 90 tablet, Rfl: 3 .  pravastatin (PRAVACHOL) 20 MG tablet, Take 1 tablet (20 mg total) by mouth daily., Disp: 90 tablet, Rfl: 3 .  loratadine (CLARITIN) 10 MG tablet, Take 1 tablet (10 mg total) daily by mouth., Disp: 90 tablet, Rfl: 3 Continue all other maintenance medications as listed above.  Follow up plan: Return in about 6 months (around 06/27/2017) for recheck.  Educational handout given for [place] PA-C [facility] [address]  [place], [place] [address] [phone]   12/28/2016, 8:32 AM

## 2016-12-28 NOTE — Patient Instructions (Addendum)
In a few days you may receive a survey in the mail or online from [employer] regarding your visit with [place] today. Please take a moment to fill this out. Your feedback is very important to our whole office. It can help [place] better understand your needs as well as improve your experience and satisfaction. Thank you for taking your time to complete it. We care about you.  [name], PA-C  trazodone 25-100 mg at bed

## 2017-01-01 ENCOUNTER — Encounter: Payer: Self-pay | Admitting: Family Medicine

## 2017-01-01 ENCOUNTER — Ambulatory Visit (INDEPENDENT_AMBULATORY_CARE_PROVIDER_SITE_OTHER): Payer: Self-pay | Admitting: Family Medicine

## 2017-01-01 VITALS — BP 115/74 | HR 78 | Temp 97.4°F | Ht 65.0 in | Wt 206.0 lb

## 2017-01-01 DIAGNOSIS — J0191 Acute recurrent sinusitis, unspecified: Secondary | ICD-10-CM

## 2017-01-01 MED ORDER — PREDNISONE 20 MG PO TABS: ORAL_TABLET | ORAL | 0 refills | Status: DC

## 2017-01-01 MED ORDER — AZITHROMYCIN 250 MG PO TABS: ORAL_TABLET | ORAL | 0 refills | Status: DC

## 2017-01-01 NOTE — Progress Notes (Signed)
BP 115/74   Pulse 78   Temp (!) 97.4 F (36.3 C) (Oral)   Ht 5\' 5"  (1.651 m)   Wt 206 lb (93.4 kg)   BMI 34.28 kg/m    Subjective:    Patient ID: Whitney Ross, female    DOB: Jan 04, 1956, 61 y.o.   MRN: [id number]  HPI: Whitney Ross is a 61 y.o. female presenting on 01/01/2017 for Sinusitis (bilateral ear pain, jaw pain, teeth pain, hurting in neck)   HPI Sinus congestion ear pain and jaw pain Patient comes in complaining of sinus congestion and ear pain jaw pain that is been going on for the past 2 days.  She says it hurts down into her neck as well.  She has been having some drainage and coughing and nasal drainage as well.  She denies any shortness of breath or wheezing more than what she normally has because she is a smoker.  She denies any sick contacts that she knows of.  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  She denies any fevers or chills  Relevant past medical, surgical, family and social history reviewed and updated as indicated. Interim medical history since our last visit reviewed. Allergies and medications reviewed and updated.  Review of Systems  Constitutional: Negative for chills and fever.  HENT: Positive for congestion, postnasal drip, rhinorrhea, sinus pressure, sneezing and sore throat. Negative for ear discharge and ear pain.   Eyes: Negative for pain, redness and visual disturbance.  Respiratory: Positive for cough. Negative for chest tightness and shortness of breath.   Cardiovascular: Negative for chest pain and leg swelling.  Genitourinary: Negative for difficulty urinating and dysuria.  Musculoskeletal: Negative for back pain and gait problem.  Skin: Negative for rash.  Neurological: Negative for light-headedness and headaches.  Psychiatric/Behavioral: Negative for agitation and behavioral problems.  All other systems reviewed and are  negative.   Per HPI unless specifically indicated above        Objective:    BP 115/74   Pulse 78   Temp (!) 97.4 F (36.3 C) (Oral)   Ht 5\' 5"  (1.651 m)   Wt 206 lb (93.4 kg)   BMI 34.28 kg/m   Wt Readings from Last 3 Encounters:  01/01/17 206 lb (93.4 kg)  12/28/16 206 lb 9.6 oz (93.7 kg)  07/01/16 202 lb (91.6 kg)    Physical Exam  Constitutional: She is oriented to person, place, and time. She appears well-developed and well-nourished. No distress.  HENT:  Right Ear: Tympanic membrane, external ear and ear canal normal.  Left Ear: Tympanic membrane, external ear and ear canal normal.  Nose: Mucosal edema and rhinorrhea present. No epistaxis. Right sinus exhibits no maxillary sinus tenderness and no frontal sinus tenderness. Left sinus exhibits no maxillary sinus tenderness and no frontal sinus tenderness.  Mouth/Throat: Uvula is midline and mucous membranes are normal. Posterior oropharyngeal edema and posterior oropharyngeal erythema present. No oropharyngeal exudate or tonsillar abscesses.  Eyes: Conjunctivae are normal.  Neck: Neck supple.  Cardiovascular: Normal rate, regular rhythm, normal heart sounds and intact distal pulses.  No murmur heard. Pulmonary/Chest: Effort normal and breath sounds normal. No respiratory distress. She has no wheezes. She has no rales.  Musculoskeletal: Normal range of motion.  Lymphadenopathy:    She has no cervical adenopathy.  Neurological: She is alert and oriented to person, place, and time. Coordination normal.  Skin: Skin is warm and dry. No rash noted. She is not diaphoretic.  Psychiatric: She has a normal mood and affect. Her behavior is normal.  Vitals reviewed.       Assessment & Plan:   Problem List Items Addressed This Visit      Respiratory   Sinusitis, acute - Primary   Relevant Medications   azithromycin (ZITHROMAX) 250 MG tablet   predniSONE (DELTASONE) 20 MG tablet       Follow up plan: Return if  symptoms worsen or fail to improve.  Counseling provided for all of the vaccine components No orders of the defined types were placed in this encounter.   Caryl Pina, MD [facility] 01/01/2017, 10:54 AM

## 2017-03-01 ENCOUNTER — Ambulatory Visit (INDEPENDENT_AMBULATORY_CARE_PROVIDER_SITE_OTHER): Payer: Self-pay | Admitting: Family Medicine

## 2017-03-01 ENCOUNTER — Encounter: Payer: Self-pay | Admitting: Family Medicine

## 2017-03-01 VITALS — BP 105/65 | HR 80 | Ht 65.0 in | Wt 207.0 lb

## 2017-03-01 DIAGNOSIS — N39490 Overflow incontinence: Secondary | ICD-10-CM

## 2017-03-01 DIAGNOSIS — R3129 Other microscopic hematuria: Secondary | ICD-10-CM

## 2017-03-01 DIAGNOSIS — N3289 Other specified disorders of bladder: Secondary | ICD-10-CM

## 2017-03-01 DIAGNOSIS — R8290 Unspecified abnormal findings in urine: Secondary | ICD-10-CM

## 2017-03-01 LAB — MICROSCOPIC EXAMINATION
Bacteria, UA: NONE SEEN
RENAL EPITHEL UA: NONE SEEN /HPF

## 2017-03-01 LAB — URINALYSIS, COMPLETE
Bilirubin, UA: NEGATIVE
Glucose, UA: NEGATIVE
LEUKOCYTES UA: NEGATIVE
Nitrite, UA: NEGATIVE
PROTEIN UA: NEGATIVE
Specific Gravity, UA: >1.03 — ABNORMAL HIGH (ref 1.005–1.030)
Urobilinogen, Ur: 0.2 mg/dL (ref 0.2–1.0)
pH, UA: 6 (ref 5.0–7.5)

## 2017-03-01 NOTE — Patient Instructions (Signed)
Your urine does demonstrate dehydration and blood.  There is no evidence at this time of a urinary tract infection.  However, I did send this for urine culture to see if any bacterial will grow.  If we do find growth, I will send an antibiotic for you.  As we discussed, is totally possible that you are having bladder spasm related to a possible unpassed kidney stone.  This would also explain blood in your urine.  Given your smoking status, we cannot rule out bladder cancers without a more in-depth evaluation.  For this reason, I have recommended that you see a urologist.  A referral has been placed.  If you do not hear from them within the next week for an appointment, please call our office.  Urinary Incontinence Urinary incontinence is the involuntary loss of urine from your bladder. What are the causes? There are many causes of urinary incontinence. They include:  Medicines.  Infections.  Prostatic enlargement, leading to overflow of urine from your bladder.  Surgery.  Neurological diseases.  Emotional factors.  What are the signs or symptoms? Urinary Incontinence can be divided into four types: 1. Urge incontinence. Urge incontinence is the involuntary loss of urine before you have the opportunity to go to the bathroom. There is a sudden urge to void but not enough time to reach a bathroom. 2. Stress incontinence. Stress incontinence is the sudden loss of urine with any activity that forces urine to pass. It is commonly caused by anatomical changes to the pelvis and sphincter areas of your body. 3. Overflow incontinence. Overflow incontinence is the loss of urine from an obstructed opening to your bladder. This results in a backup of urine and a resultant buildup of pressure within the bladder. When the pressure within the bladder exceeds the closing pressure of the sphincter, the urine overflows, which causes incontinence, similar to water overflowing a dam. 4. Total incontinence. Total  incontinence is the loss of urine as a result of the inability to store urine within your bladder.  How is this diagnosed? Evaluating the cause of incontinence may require:  A thorough and complete medical and obstetric history.  A complete physical exam.  Laboratory tests such as a urine culture and sensitivities.  When additional tests are indicated, they can include:  An ultrasound exam.  Kidney and bladder X-rays.  Cystoscopy. This is an exam of the bladder using a narrow scope.  Urodynamic testing to test the nerve function to the bladder and sphincter areas.  How is this treated? Treatment for urinary incontinence depends on the cause:  For urge incontinence caused by a bacterial infection, antibiotics will be prescribed. If the urge incontinence is related to medicines you take, your health care provider may have you change the medicine.  For stress incontinence, surgery to re-establish anatomical support to the bladder or sphincter, or both, will often correct the condition.  For overflow incontinence caused by an enlarged prostate, an operation to open the channel through the enlarged prostate will allow the flow of urine out of the bladder. In women with fibroids, a hysterectomy may be recommended.  For total incontinence, surgery on your urinary sphincter may help. An artificial urinary sphincter (an inflatable cuff placed around the urethra) may be required. In women who have developed a hole-like passage between their bladder and vagina (vesicovaginal fistula), surgery to close the fistula often is required.  Follow these instructions at home:  Normal daily hygiene and the use of pads or adult diapers  that are changed regularly will help prevent odors and skin damage.  Avoid caffeine. It can overstimulate your bladder.  Use the bathroom regularly. Try about every 2-3 hours to go to the bathroom, even if you do not feel the need to do so. Take time to empty your  bladder completely. After urinating, wait a minute. Then try to urinate again.  For causes involving nerve dysfunction, keep a log of the medicines you take and a journal of the times you go to the bathroom. Contact a health care provider if:  You experience worsening of pain instead of improvement in pain after your procedure.  Your incontinence becomes worse instead of better. Get help right away if:  You experience fever or shaking chills.  You are unable to pass your urine.  You have redness spreading into your groin or down into your thighs. This information is not intended to replace advice given to you by your health care provider. Make sure you discuss any questions you have with your health care provider. Document Released: 03/18/2004 Document Revised: 09/19/2015 Document Reviewed: 07/18/2012 Elsevier Interactive Patient Education  [employer].

## 2017-03-01 NOTE — Progress Notes (Signed)
Subjective: CC: ?UTI PCP: Terald Sleeper, PA-C JYN:WGNFA Whitney Ross is a 62 y.o. female presenting to clinic today for:  1. Urinary symptoms Patient reports a 1 month h/o bladder spasm, overflow incontinence .  Denies urinary frequency, urgency, hematuria, fevers, chills, abdominal pain, nausea, vomiting, back pain, vaginal discharge.  She reports nocturia up to 2-3 x/ night.  Patient has used nothing for symptoms.  Patient reports a h/o frequent or recurrent UTIs.  She actually used to see Dr. Gaynelle Arabian in [place] for this.  She has had urodynamics in the past which were unremarkable.  No recent coitus or mechanical irritation to the urethra.  She reports poor fluid intake at baseline.  She is a daily smoker and has been for several years.  ROS: Per HPI  Allergies  Allergen Reactions  . Cortisporin [Bacitra-Neomycin-Polymyxin-Hc]    Past Medical History:  Diagnosis Date  . Chronic kidney disease    hx muscle stones  . Elevated cholesterol   . Hypertension   . Lumbago   . Neck pain   . Nephrolithiasis     Current Outpatient Medications:  .  ALPRAZolam (XANAX) 0.5 MG tablet, Take 1 tablet (0.5 mg total) at bedtime as needed by mouth for anxiety., Disp: 90 tablet, Rfl: 1 .  Azelastine HCl (ASTEPRO) 0.15 % SOLN, Place 2 sprays into the nose 2 (two) times daily., Disp: 1 mL, Rfl: 11 .  azithromycin (ZITHROMAX) 250 MG tablet, Take 2 the first day and then one each day after., Disp: 6 tablet, Rfl: 0 .  diazepam (VALIUM) 5 MG tablet, TAKE ONE TABLET BY MOUTH EVERY 12 HOURS AS NEEDED FOR ANXIETY, Disp: 30 tablet, Rfl: 1 .  fluticasone (FLONASE) 50 MCG/ACT nasal spray, Place 2 sprays into both nostrils daily., Disp: 1 g, Rfl: 11 .  lisinopril-hydrochlorothiazide (ZESTORETIC) 20-12.5 MG tablet, Take 1 tablet by mouth daily., Disp: 90 tablet, Rfl: 3 .  loratadine (CLARITIN) 10 MG tablet, Take 1 tablet (10 mg total) daily by mouth., Disp: 90 tablet, Rfl: 3 .  pantoprazole (PROTONIX) 40  MG tablet, Take 1 tablet (40 mg total) by mouth daily., Disp: 90 tablet, Rfl: 3 .  pravastatin (PRAVACHOL) 20 MG tablet, Take 1 tablet (20 mg total) by mouth daily., Disp: 90 tablet, Rfl: 3 .  predniSONE (DELTASONE) 20 MG tablet, 2 po at same time daily for 5 days, Disp: 10 tablet, Rfl: 0 Social History   Socioeconomic History  . Marital status: Married    Spouse name: Not on file  . Number of children: Not on file  . Years of education: Not on file  . Highest education level: Not on file  Social Needs  . Financial resource strain: Not on file  . Food insecurity - worry: Not on file  . Food insecurity - inability: Not on file  . Transportation needs - medical: Not on file  . Transportation needs - non-medical: Not on file  Occupational History  . Not on file  Tobacco Use  . Smoking status: Current Every Day Smoker    Packs/day: 1.00  . Smokeless tobacco: Never Used  Substance and Sexual Activity  . Alcohol use: No    Comment: RARELY  . Drug use: No  . Sexual activity: Not Currently  Other Topics Concern  . Not on file  Social History Narrative  . Not on file   Family History  Problem Relation Age of Onset  . Heart failure Mother   . Cancer Father  [place]  . Hypertension Sister   . Hyperlipidemia Sister   . Hyperlipidemia Brother   . Hypertension Brother   . Colon cancer Neg Hx   . Esophageal cancer Neg Hx   . Stomach cancer Neg Hx   . Rectal cancer Neg Hx     Objective: Office vital signs reviewed. BP 105/65   Pulse 80   Ht 5\' 5"  (1.651 m)   Wt 207 lb (93.9 kg)   BMI 34.45 kg/m   Physical Examination:  General: Awake, alert, well nourished, nontoxic appearing, No acute distress HEENT: Normal, sclera white GU: no suprapubic TTP, no CVA TTP  Assessment/ Plan: 62 y.o. female   1. Bladder spasm May be bladder irritation from retained stone versus infection versus a detrusor abnormality.  I recommended that she reduce caffeine intake and reduce  consumption of other bladder irritants. - Ambulatory referral to Urology  2. Abnormal urine odor UA with evidence of dehydration.  RBCs were also seen on microscopy.  This has been sent for culture.  At this time, no bacteria, leukocytes or nitrites to suggest UTI. - Urinalysis, Complete - Urine Culture  3. Microscopic hematuria No bacteria appreciated within the urine specimen.  3-10 RBCs were seen.  No leukocytes or nitrites.  May be related to a retained urinary stone.  However, given her long-standing history of smoking cannot rule out malignancy without further evaluation.  I referred her back to her urologist for direct visualization and management. - Ambulatory referral to Urology  4. Overflow incontinence [place] exercises recommended.  Avoid fluids before bedtime.  Reduce caffeine intake.  She is uninsured so if pharmacologic therapy is considered, would consider 1 of the low cost therapies if possible.  Follow-up with urology. - Ambulatory referral to Urology   Orders Placed This Encounter  Procedures  . Urine Culture  . Urinalysis, Complete  . Ambulatory referral to Urology    Referral Priority:   Routine    Referral Type:   Consultation    Referral Reason:   Specialty Services Required    Requested Specialty:   Urology    Number of Visits Requested:   [place], [place] [phone]

## 2017-03-02 LAB — URINE CULTURE

## 2017-05-14 ENCOUNTER — Encounter: Payer: Self-pay | Admitting: Family

## 2017-05-14 ENCOUNTER — Ambulatory Visit (INDEPENDENT_AMBULATORY_CARE_PROVIDER_SITE_OTHER): Payer: Self-pay | Admitting: Family

## 2017-05-14 VITALS — BP 141/72 | HR 84 | Temp 97.8°F | Ht 65.0 in | Wt 208.0 lb

## 2017-05-14 DIAGNOSIS — F17200 Nicotine dependence, unspecified, uncomplicated: Secondary | ICD-10-CM

## 2017-05-14 DIAGNOSIS — M25562 Pain in left knee: Secondary | ICD-10-CM

## 2017-05-14 NOTE — Patient Instructions (Signed)
Baker Cyst A Baker cyst, also called a popliteal cyst, is a sac-like growth that forms at the back of the knee. The cyst forms when the fluid-filled sac (bursa) that cushions the knee joint becomes enlarged. The bursa that becomes a Baker cyst is located at the back of the knee joint. What are the causes? In most cases, a Baker cyst results from another knee problem that causes swelling inside the knee. This makes the fluid inside the knee joint (synovial fluid) flow into the bursa behind the knee, causing the bursa to enlarge. What increases the risk? You may be more likely to develop a Baker cyst if you already have a knee problem, such as:  A tear in cartilage that cushions the knee joint (meniscal tear).  A tear in the tissues that connect the bones of the knee joint (ligament tear).  Knee swelling from osteoarthritis, rheumatoid arthritis, or gout.  What are the signs or symptoms? A Baker cyst does not always cause symptoms. A lump behind the knee may be the only sign of the condition. The lump may be painful, especially when the knee is straightened. If the lump is painful, the pain may come and go. The knee may also be stiff. Symptoms may quickly get more severe if the cyst breaks open (ruptures). If your cyst ruptures, signs and symptoms may affect the knee and the back of the lower leg (calf) and may include:  Sudden or worsening pain.  Swelling.  Bruising.  How is this diagnosed? This condition may be diagnosed based on your symptoms and medical history. Your health care provider will also do a physical exam. This may include:  Feeling the cyst to check whether it is tender.  Checking your knee for signs of another knee condition that causes swelling.  You may have imaging tests, such as:  X-rays.  MRI.  Ultrasound.  How is this treated? A Baker cyst that is not painful may go away without treatment. If the cyst gets large or painful, it will likely get better if the  underlying knee problem is treated. Treatment for a Baker cyst may include:  Resting.  Keeping weight off of the knee. This means not leaning on the knee to support your body weight.  NSAIDs to reduce pain and swelling.  A procedure to drain the fluid from the cyst with a needle (aspiration). You may also get an injection of a medicine that reduces swelling (steroid).  Surgery. This may be needed if other treatments do not work. This usually involves correcting knee damage and removing the cyst.  Follow these instructions at home:  Take over-the-counter and prescription medicines only as told by your health care provider.  Rest and return to your normal activities as told by your health care provider. Avoid activities that make pain or swelling worse. Ask your health care provider what activities are safe for you.  Keep all follow-up visits as told by your health care provider. This is important. Contact a health care provider if:  You have knee pain, stiffness, or swelling that does not get better. Get help right away if:  You have sudden or worsening pain and swelling in your calf area. This information is not intended to replace advice given to you by your health care provider. Make sure you discuss any questions you have with your health care provider. Document Released: 02/08/2005 Document Revised: 10/30/2015 Document Reviewed: 10/30/2015 Elsevier Interactive Patient Education  2018 [employer]  

## 2017-05-14 NOTE — Progress Notes (Signed)
   Subjective:    Patient ID: Whitney Ross, female    DOB: 20-Nov-1955, 62 y.o.   MRN: [id number]  Knee Pain   The incident occurred 3 to 5 days ago. There was no injury mechanism. The pain is present in the left knee. The quality of the pain is described as aching. The pain is at a severity of 9/10. The pain is mild. The pain has been intermittent since onset. Pertinent negatives include no loss of motion, muscle weakness, numbness or tingling. She reports no foreign bodies present. The symptoms are aggravated by weight bearing and movement. She has tried acetaminophen and NSAIDs for the symptoms. The treatment provided mild relief.      Review of Systems  Neurological: Negative for tingling and numbness.  All other systems reviewed and are negative.      Objective:   Physical Exam  Constitutional: She is oriented to person, place, and time. She appears well-developed and well-nourished. No distress.  HENT:  Head: Normocephalic.  Eyes: Pupils are equal, round, and reactive to light.  Neck: Normal range of motion. Neck supple. No thyromegaly present.  Cardiovascular: Normal rate, regular rhythm, normal heart sounds and intact distal pulses.  No murmur heard. Pulmonary/Chest: Effort normal and breath sounds normal. No respiratory distress. She has no wheezes.  Abdominal: Soft. Bowel sounds are normal. She exhibits no distension. There is no tenderness.  Musculoskeletal: Normal range of motion. She exhibits tenderness. She exhibits no edema.  Negative homan's signs, tendernss in posterior left knee with flexion  Neurological: She is alert and oriented to person, place, and time.  Skin: Skin is warm and dry.  Psychiatric: She has a normal mood and affect. Her behavior is normal. Judgment and thought content normal.  Vitals reviewed.   BP (!) 141/72   Pulse 84   Temp 97.8 F (36.6 C) (Oral)   Ht 5\' 5"  (1.651 m)   Wt 208 lb (94.3 kg)   BMI 34.61 kg/m      Assessment & Plan:    1. Posterior left knee pain - US Venous Img Lower Unilateral Right; Future  2. Current smoker   Will send for [place] to rule out DVT Could be DVT, bakers cysts, or strain? However with patient's risks of  smoking,age , and obese need to rule out DVT Pt is self pay and does not wish to go to ED at this time. Will wait on [place] to be scheduled, if any SOB, chest tightness, or increased pain in leg go to ED Xarelto started pack given to patient and told not to take until we call her with the results  Evelina Dun, FNP

## 2017-05-16 ENCOUNTER — Telehealth: Payer: Self-pay | Admitting: Family

## 2017-05-16 ENCOUNTER — Ambulatory Visit (HOSPITAL_COMMUNITY)
Admission: RE | Admit: 2017-05-16 | Discharge: 2017-05-16 | Disposition: A | Discharge status: Home / Self Care | Payer: Self-pay | Source: Ambulatory Visit | Attending: Family | Admitting: Family

## 2017-05-16 ENCOUNTER — Other Ambulatory Visit: Payer: Self-pay | Admitting: Family

## 2017-05-16 DIAGNOSIS — M25562 Pain in left knee: Secondary | ICD-10-CM

## 2017-05-16 MED ORDER — NAPROXEN 500 MG PO TABS: 500.0000 mg | ORAL_TABLET | Freq: Two times a day (BID) | ORAL | 1 refills | Status: DC

## 2017-05-16 NOTE — Telephone Encounter (Signed)
We will treat as knee sprain. Naprosyn Prescription sent to pharmacy. Rest, Ice, and elevation. Let me know if the pain becomes worse or does not improve.

## 2017-05-17 ENCOUNTER — Encounter: Payer: Self-pay | Admitting: Physician Assistant

## 2017-05-17 ENCOUNTER — Ambulatory Visit (INDEPENDENT_AMBULATORY_CARE_PROVIDER_SITE_OTHER): Payer: Self-pay | Admitting: Physician Assistant

## 2017-05-17 VITALS — BP 113/71 | HR 75 | Temp 97.1°F | Ht 65.0 in | Wt 208.0 lb

## 2017-05-17 DIAGNOSIS — M25562 Pain in left knee: Secondary | ICD-10-CM

## 2017-05-17 MED ORDER — METHYLPREDNISOLONE ACETATE 80 MG/ML IJ SUSP
80.0000 mg | Freq: Once | INTRAMUSCULAR | Status: AC
  Administered 2017-05-17: 80 mg via INTRAMUSCULAR

## 2017-05-17 NOTE — Progress Notes (Signed)
BP 113/71   Pulse 75   Temp (!) 97.1 F (36.2 C) (Oral)   Ht 5\' 5"  (1.651 m)   Wt 208 lb (94.3 kg)   BMI 34.61 kg/m    Subjective:    Patient ID: Whitney Ross, female    DOB: 05-15-1955, 62 y.o.   MRN: [id number]  HPI: Whitney Ross is a 62 y.o. female presenting on 05/17/2017 for Knee Pain (left)  Has knee pain that started last week, on its own, no history of injury.  Doppler of left leg was normal. Has done very little over the weekend, every time she was up she had great pain in posterior and in the area below the knee.   Past Medical History:  Diagnosis Date  . Chronic kidney disease    hx muscle stones  . Elevated cholesterol   . Hypertension   . Lumbago   . Neck pain   . Nephrolithiasis    Relevant past medical, surgical, family and social history reviewed and updated as indicated. Interim medical history since our last visit reviewed. Allergies and medications reviewed and updated. DATA REVIEWED: CHART IN EPIC  Family History reviewed for pertinent findings.  Review of Systems  Constitutional: Negative.   HENT: Negative.   Eyes: Negative.   Respiratory: Negative.   Gastrointestinal: Negative.   Genitourinary: Negative.   Musculoskeletal: Positive for arthralgias, gait problem and joint swelling.    Allergies as of 05/17/2017      Reactions   Cortisporin [bacitra-neomycin-polymyxin-hc]       Medication List        Accurate as of 05/17/17 11:23 AM. Always use your most recent med list.          ALPRAZolam 0.5 MG tablet Commonly known as:  XANAX Take 1 tablet (0.5 mg total) at bedtime as needed by mouth for anxiety.   Azelastine HCl 0.15 % Soln Commonly known as:  ASTEPRO Place 2 sprays into the nose 2 (two) times daily.   diazepam 5 MG tablet Commonly known as:  VALIUM TAKE ONE TABLET BY MOUTH EVERY 12 HOURS AS NEEDED FOR ANXIETY   fluticasone 50 MCG/ACT nasal spray Commonly known as:  FLONASE Place 2 sprays into both nostrils  daily.   lisinopril-hydrochlorothiazide 20-12.5 MG tablet Commonly known as:  ZESTORETIC Take 1 tablet by mouth daily.   loratadine 10 MG tablet Commonly known as:  CLARITIN Take 1 tablet (10 mg total) daily by mouth.   naproxen 500 MG tablet Commonly known as:  NAPROSYN Take 1 tablet (500 mg total) by mouth 2 (two) times daily with a meal.   pantoprazole 40 MG tablet Commonly known as:  PROTONIX Take 1 tablet (40 mg total) by mouth daily.   pravastatin 20 MG tablet Commonly known as:  PRAVACHOL Take 1 tablet (20 mg total) by mouth daily.          Objective:    BP 113/71   Pulse 75   Temp (!) 97.1 F (36.2 C) (Oral)   Ht 5\' 5"  (1.651 m)   Wt 208 lb (94.3 kg)   BMI 34.61 kg/m   Allergies  Allergen Reactions  . Cortisporin [Bacitra-Neomycin-Polymyxin-Hc]     Wt Readings from Last 3 Encounters:  05/17/17 208 lb (94.3 kg)  05/14/17 208 lb (94.3 kg)  03/01/17 207 lb (93.9 kg)    Physical Exam  Constitutional: She is oriented to person, place, and time. She appears well-developed and well-nourished.  HENT:  Head: Normocephalic and atraumatic.  Eyes: Pupils are equal, round, and reactive to light. Conjunctivae and EOM are normal.  Cardiovascular: Normal rate, regular rhythm, normal heart sounds and intact distal pulses.  Pulmonary/Chest: Effort normal and breath sounds normal.  Abdominal: Soft. Bowel sounds are normal.  Musculoskeletal:       Left knee: She exhibits decreased range of motion, swelling and erythema. She exhibits no deformity. Tenderness found. Patellar tendon tenderness noted.       Legs: Neurological: She is alert and oriented to person, place, and time. She has normal reflexes.  Skin: Skin is warm and dry. No rash noted.  Psychiatric: She has a normal mood and affect. Her behavior is normal. Judgment and thought content normal.    Results for orders placed or performed in visit on 03/01/17  Urine Culture  Result Value Ref Range   Urine  Culture, Routine Final report    Organism ID, Bacteria Comment   Microscopic Examination  Result Value Ref Range   WBC, UA 0-5 0 - 5 /hpf   RBC, UA 3-10 (A) 0 - 2 /hpf   Epithelial Cells (non renal) 0-10 0 - 10 /hpf   Renal Epithel, UA None seen None seen /hpf   Mucus, UA Present Not Estab.   Bacteria, UA None seen None seen/Few  Urinalysis, Complete  Result Value Ref Range   Specific Gravity, UA >1.030 (H) 1.005 - 1.030   pH, UA 6.0 5.0 - 7.5   Color, UA Yellow Yellow   Appearance Ur Clear Clear   Leukocytes, UA Negative Negative   Protein, UA Negative Negative/Trace   Glucose, UA Negative Negative   Ketones, UA Trace (A) Negative   RBC, UA Trace (A) Negative   Bilirubin, UA Negative Negative   Urobilinogen, Ur 0.2 0.2 - 1.0 mg/dL   Nitrite, UA Negative Negative   Microscopic Examination See below:       Assessment & Plan:   1. Acute pain of left knee - methylPREDNISolone acetate (DEPO-MEDROL) injection 80 mg Naproxen 500 mg BID at the pharmacy  Continue all other maintenance medications as listed above.  Follow up plan: No follow-ups on file.  Educational handout given for [place] PA-C [facility] [address]  [place], [place] [address] [phone]   05/17/2017, 11:23 AM

## 2017-05-17 NOTE — Telephone Encounter (Signed)
Patient is here now being seen

## 2017-06-29 ENCOUNTER — Encounter: Payer: Self-pay | Admitting: Physician Assistant

## 2017-06-29 ENCOUNTER — Ambulatory Visit (INDEPENDENT_AMBULATORY_CARE_PROVIDER_SITE_OTHER): Payer: Self-pay | Admitting: Physician Assistant

## 2017-06-29 VITALS — BP 111/72 | HR 67 | Temp 98.2°F | Ht 65.0 in | Wt 206.0 lb

## 2017-06-29 DIAGNOSIS — K219 Gastro-esophageal reflux disease without esophagitis: Secondary | ICD-10-CM

## 2017-06-29 DIAGNOSIS — M25551 Pain in right hip: Secondary | ICD-10-CM

## 2017-06-29 DIAGNOSIS — T753XXD Motion sickness, subsequent encounter: Secondary | ICD-10-CM

## 2017-06-29 DIAGNOSIS — E7800 Pure hypercholesterolemia, unspecified: Secondary | ICD-10-CM

## 2017-06-29 DIAGNOSIS — I10 Essential (primary) hypertension: Secondary | ICD-10-CM

## 2017-06-29 DIAGNOSIS — G8929 Other chronic pain: Secondary | ICD-10-CM

## 2017-06-29 MED ORDER — PREDNISONE 10 MG (48) PO TBPK: ORAL_TABLET | ORAL | 1 refills | Status: DC

## 2017-06-29 MED ORDER — PRAVASTATIN SODIUM 20 MG PO TABS: 20.0000 mg | ORAL_TABLET | Freq: Every day | ORAL | 3 refills | Status: DC

## 2017-06-29 MED ORDER — LISINOPRIL-HYDROCHLOROTHIAZIDE 20-12.5 MG PO TABS: 1.0000 | ORAL_TABLET | Freq: Every day | ORAL | 3 refills | Status: DC

## 2017-06-29 MED ORDER — PANTOPRAZOLE SODIUM 40 MG PO TBEC: 40.0000 mg | DELAYED_RELEASE_TABLET | Freq: Every day | ORAL | 3 refills | Status: DC

## 2017-06-29 MED ORDER — DIAZEPAM 5 MG PO TABS: ORAL_TABLET | ORAL | 1 refills | Status: DC

## 2017-06-29 MED ORDER — ALPRAZOLAM 0.5 MG PO TABS: 0.5000 mg | ORAL_TABLET | Freq: Every evening | ORAL | 1 refills | Status: DC | PRN

## 2017-06-29 NOTE — Progress Notes (Signed)
BP 111/72   Pulse 67   Temp 98.2 F (36.8 C) (Oral)   Ht 5\' 5"  (1.651 m)   Wt 206 lb (93.4 kg)   BMI 34.28 kg/m    Subjective:    Patient ID: Whitney Ross, female    DOB: Oct 04, 1955, 62 y.o.   MRN: [id number]  HPI: ALIENA Ross is a 62 y.o. female presenting on 06/29/2017 for Flank Pain (right side ); Hypertension; Hyperlipidemia; and vitamin d deficiency  This patient comes in for a follow-up on her chronic conditions.  She does have hyperlipidemia, hypertension, anxiety, chronic right side pain.  The pain is been going on for about a month.  It does hurt when she moves.  It is primarily at the anterior portion of the hip and along the iliac crest.  She states that when she moves it hurts more.  She has not had any change in her bowel or bladder function.  She denies any fever or chills.  She has had a complete hysterectomy.  Past Medical History:  Diagnosis Date  . Chronic kidney disease    hx muscle stones  . Elevated cholesterol   . Hypertension   . Lumbago   . Neck pain   . Nephrolithiasis    Relevant past medical, surgical, family and social history reviewed and updated as indicated. Interim medical history since our last visit reviewed. Allergies and medications reviewed and updated. DATA REVIEWED: CHART IN EPIC  Family History reviewed for pertinent findings.  Review of Systems  Constitutional: Negative.  Negative for activity change, fatigue and fever.  HENT: Negative.   Eyes: Negative.   Respiratory: Negative.  Negative for cough.   Cardiovascular: Negative.  Negative for chest pain.  Gastrointestinal: Negative.  Negative for abdominal pain.  Endocrine: Negative.   Genitourinary: Negative.  Negative for dysuria.  Musculoskeletal: Positive for arthralgias and myalgias.  Skin: Negative.   Neurological: Negative.   Psychiatric/Behavioral: The patient is nervous/anxious.     Allergies as of 06/29/2017      Reactions   Cortisporin  [bacitra-neomycin-polymyxin-hc]       Medication List        Accurate as of 06/29/17  2:20 PM. Always use your most recent med list.          ALPRAZolam 0.5 MG tablet Commonly known as:  XANAX Take 1 tablet (0.5 mg total) by mouth at bedtime as needed for anxiety.   diazepam 5 MG tablet Commonly known as:  VALIUM TAKE ONE TABLET BY MOUTH EVERY 12 HOURS AS NEEDED FOR ANXIETY   fluticasone 50 MCG/ACT nasal spray Commonly known as:  FLONASE Place 2 sprays into both nostrils daily.   lisinopril-hydrochlorothiazide 20-12.5 MG tablet Commonly known as:  ZESTORETIC Take 1 tablet by mouth daily.   loratadine 10 MG tablet Commonly known as:  CLARITIN Take 1 tablet (10 mg total) daily by mouth.   naproxen 500 MG tablet Commonly known as:  NAPROSYN Take 1 tablet (500 mg total) by mouth 2 (two) times daily with a meal.   pantoprazole 40 MG tablet Commonly known as:  PROTONIX Take 1 tablet (40 mg total) by mouth daily.   pravastatin 20 MG tablet Commonly known as:  PRAVACHOL Take 1 tablet (20 mg total) by mouth daily.   predniSONE 10 MG (48) Tbpk tablet Commonly known as:  STERAPRED UNI-PAK 48 TAB Take as directed          Objective:    BP 111/72  Pulse 67   Temp 98.2 F (36.8 C) (Oral)   Ht 5\' 5"  (1.651 m)   Wt 206 lb (93.4 kg)   BMI 34.28 kg/m   Allergies  Allergen Reactions  . Cortisporin [Bacitra-Neomycin-Polymyxin-Hc]     Wt Readings from Last 3 Encounters:  06/29/17 206 lb (93.4 kg)  05/17/17 208 lb (94.3 kg)  05/14/17 208 lb (94.3 kg)    Physical Exam  Constitutional: She is oriented to person, place, and time. She appears well-developed and well-nourished.  HENT:  Head: Normocephalic and atraumatic.  Eyes: Pupils are equal, round, and reactive to light. Conjunctivae and EOM are normal.  Cardiovascular: Normal rate, regular rhythm, normal heart sounds and intact distal pulses.  Pulmonary/Chest: Effort normal and breath sounds normal.  Abdominal:  Soft. Bowel sounds are normal.  Musculoskeletal:       Right hip: She exhibits tenderness. She exhibits normal strength, no crepitus and no deformity.       Legs: Pain along the right iliac crest and somewhat down to the front of the right hip.  Neurological: She is alert and oriented to person, place, and time. She has normal reflexes.  Skin: Skin is warm and dry. No rash noted.  Psychiatric: She has a normal mood and affect. Her behavior is normal. Judgment and thought content normal.        Assessment & Plan:   1. Chronic right hip pain - predniSONE (STERAPRED UNI-PAK 48 TAB) 10 MG (48) TBPK tablet; Take as directed  Dispense: 48 tablet; Refill: 1 Hip exercises are discussed Consider chiropractic treatment Orthopedic will be the next step  2. Gastroesophageal reflux disease without esophagitis - pantoprazole (PROTONIX) 40 MG tablet; Take 1 tablet (40 mg total) by mouth daily.  Dispense: 90 tablet; Refill: 3  3. Essential hypertension - lisinopril-hydrochlorothiazide (ZESTORETIC) 20-12.5 MG tablet; Take 1 tablet by mouth daily.  Dispense: 90 tablet; Refill: 3  4. Pure hypercholesterolemia - pravastatin (PRAVACHOL) 20 MG tablet; Take 1 tablet (20 mg total) by mouth daily.  Dispense: 90 tablet; Refill: 3    Continue all other maintenance medications as listed above.  Follow up plan: Return in about 6 months (around 12/30/2017) for recheck.  Educational handout given for [place] PA-C [place] [address]  [place], [place] [address] [phone]   06/29/2017, 2:20 PM

## 2017-08-08 ENCOUNTER — Ambulatory Visit (INDEPENDENT_AMBULATORY_CARE_PROVIDER_SITE_OTHER): Payer: Self-pay | Admitting: Family Medicine

## 2017-08-08 ENCOUNTER — Encounter: Payer: Self-pay | Admitting: Family Medicine

## 2017-08-08 VITALS — BP 124/77 | HR 77 | Temp 98.5°F | Ht 65.0 in | Wt 208.0 lb

## 2017-08-08 DIAGNOSIS — J069 Acute upper respiratory infection, unspecified: Secondary | ICD-10-CM

## 2017-08-08 MED ORDER — AMOXICILLIN-POT CLAVULANATE 875-125 MG PO TABS: 1.0000 | ORAL_TABLET | Freq: Two times a day (BID) | ORAL | 0 refills | Status: DC

## 2017-08-08 NOTE — Progress Notes (Signed)
Subjective: CC: URI PCP: Whitney Sleeper, PA-C [name] is a 62 y.o. female presenting to clinic today for:  1. Cold symptoms  Patient reports cough, bilateral ear fullness and pain, hoarseness and rhinorrhea that started yesterday.  Denies hemoptysis, headache, SOB, dizziness, rash, nausea, vomiting, diarrhea, fevers, chills, myalgia.  She notes that her husband has been sick recently as well.  Patient has used some leftover prednisone yesterday with little relief of symptoms.  Denies history of COPD or asthma.  She is an active every day tobacco smoker.  ROS: Per HPI  Allergies  Allergen Reactions  . Cortisporin [Bacitra-Neomycin-Polymyxin-Hc]    Past Medical History:  Diagnosis Date  . Chronic kidney disease    hx muscle stones  . Elevated cholesterol   . Hypertension   . Lumbago   . Neck pain   . Nephrolithiasis     Current Outpatient Medications:  .  ALPRAZolam (XANAX) 0.5 MG tablet, Take 1 tablet (0.5 mg total) by mouth at bedtime as needed for anxiety., Disp: 90 tablet, Rfl: 1 .  diazepam (VALIUM) 5 MG tablet, TAKE ONE TABLET BY MOUTH EVERY 12 HOURS AS NEEDED FOR ANXIETY, Disp: 30 tablet, Rfl: 1 .  fluticasone (FLONASE) 50 MCG/ACT nasal spray, Place 2 sprays into both nostrils daily., Disp: 1 g, Rfl: 11 .  lisinopril-hydrochlorothiazide (ZESTORETIC) 20-12.5 MG tablet, Take 1 tablet by mouth daily., Disp: 90 tablet, Rfl: 3 .  loratadine (CLARITIN) 10 MG tablet, Take 1 tablet (10 mg total) daily by mouth., Disp: 90 tablet, Rfl: 3 .  naproxen (NAPROSYN) 500 MG tablet, Take 1 tablet (500 mg total) by mouth 2 (two) times daily with a meal., Disp: 60 tablet, Rfl: 1 .  pantoprazole (PROTONIX) 40 MG tablet, Take 1 tablet (40 mg total) by mouth daily., Disp: 90 tablet, Rfl: 3 .  pravastatin (PRAVACHOL) 20 MG tablet, Take 1 tablet (20 mg total) by mouth daily., Disp: 90 tablet, Rfl: 3 Social History   Socioeconomic History  . Marital status: Married    Spouse name:  Not on file  . Number of children: Not on file  . Years of education: Not on file  . Highest education level: Not on file  Occupational History  . Not on file  Social Needs  . Financial resource strain: Not on file  . Food insecurity:    Worry: Not on file    Inability: Not on file  . Transportation needs:    Medical: Not on file    Non-medical: Not on file  Tobacco Use  . Smoking status: Current Every Day Smoker    Packs/day: 1.00  . Smokeless tobacco: Never Used  Substance and Sexual Activity  . Alcohol use: No    Comment: RARELY  . Drug use: No  . Sexual activity: Not Currently  Lifestyle  . Physical activity:    Days per week: Not on file    Minutes per session: Not on file  . Stress: Not on file  Relationships  . Social connections:    Talks on phone: Not on file    Gets together: Not on file    Attends religious service: Not on file    Active member of club or organization: Not on file    Attends meetings of clubs or organizations: Not on file    Relationship status: Not on file  . Intimate partner violence:    Fear of current or ex partner: Not on file    Emotionally abused: Not on  file    Physically abused: Not on file    Forced sexual activity: Not on file  Other Topics Concern  . Not on file  Social History Narrative  . Not on file   Family History  Problem Relation Age of Onset  . Heart failure Mother   . Cancer Father        MELENOMA  . Hypertension Sister   . Hyperlipidemia Sister   . Hyperlipidemia Brother   . Hypertension Brother   . Colon cancer Neg Hx   . Esophageal cancer Neg Hx   . Stomach cancer Neg Hx   . Rectal cancer Neg Hx     Objective: Office vital signs reviewed. BP 124/77   Pulse 77   Temp 98.5 F (36.9 C) (Oral)   Ht 5\' 5"  (1.651 m)   Wt 208 lb (94.3 kg)   BMI 34.61 kg/m   Physical Examination:  General: Awake, alert, well nourished, nontoxic, No acute distress HEENT: Normal    Neck: No masses palpated. No  lymphadenopathy    Ears: Tympanic membranes intact, dulled light reflex bilaterally, no erythema, no bulging    Eyes: PERRLA, extraocular membranes intact, sclera white    Nose: nasal turbinates moist, clear nasal discharge    Throat: moist mucus membranes, mild oropharyngeal erythema, no tonsillar exudate.  Airway is patent Cardio: regular rate and rhythm, S1S2 heard, no murmurs appreciated Pulm: clear to auscultation bilaterally, no wheezes, rhonchi or rales; normal work of breathing on room air  Assessment/ Plan: 62 y.o. female   1. URI with cough and congestion Patient is afebrile and nontoxic-appearing.  Her physical exam was remarkable for dulled light reflex of the TMs bilaterally and mild oropharyngeal erythema.  No evidence of bacterial infection on exam.  I suspect that this is viral.  Home care instructions were reviewed with the patient.  Handout was provided.  However, I did give her a pocket prescription for Augmentin since she is self-pay to use if symptoms worsen or are prolonged.  Reasons for return evaluation discussed.  Patient was good understanding will follow-up as needed.  Meds ordered this encounter  Medications  . amoxicillin-clavulanate (AUGMENTIN) 875-125 MG tablet    Sig: Take 1 tablet by mouth 2 (two) times daily.    Dispense:  20 tablet    Refill:  [place], DO [place] [phone]

## 2017-08-08 NOTE — Patient Instructions (Signed)
It appears that you have a viral upper respiratory infection (cold).  Cold symptoms can last up to 2 weeks.  I have given you a pocket prescription for Augmentin to use if your symptoms worsen.  We discussed signs and symptoms of worsening infection.  I recommend that you only use cold medications that are safe in high blood pressure like Coricidin (generic is fine).  Other cold medications can increase your blood pressure.    - Get plenty of rest and drink plenty of fluids. - Try to breathe moist air. Use a cold mist humidifier. - Consume warm fluids (soup or tea) to provide relief for a stuffy nose and to loosen phlegm. - For nasal stuffiness, try saline nasal spray or a Neti Pot.  Afrin nasal spray can also be used but this product should not be used longer than 3 days or it will cause rebound nasal stuffiness (worsening nasal congestion). - For sore throat pain relief: use chloraseptic spray, suck on throat lozenges, hard candy or popsicles; gargle with warm salt water (1/4 tsp. salt per 8 oz. of water); and eat soft, bland foods. - Eat a well-balanced diet. If you cannot, ensure you are getting enough nutrients by taking a daily multivitamin. - Avoid dairy products, as they can thicken phlegm. - Avoid alcohol, as it impairs your body's immune system.  CONTACT YOUR DOCTOR IF YOU EXPERIENCE ANY OF THE FOLLOWING: - High fever - Ear pain - Sinus-type headache - Unusually severe cold symptoms - Cough that gets worse while other cold symptoms improve - Flare up of any chronic lung problem, such as asthma - Your symptoms persist longer than 2 weeks

## 2017-09-06 ENCOUNTER — Ambulatory Visit: Payer: BC Managed Care – PPO | Admitting: Family Medicine

## 2017-09-06 ENCOUNTER — Encounter: Payer: Self-pay | Admitting: Family Medicine

## 2017-09-06 VITALS — BP 122/75 | HR 77 | Temp 97.8°F | Ht 65.0 in | Wt 210.0 lb

## 2017-09-06 DIAGNOSIS — M25562 Pain in left knee: Secondary | ICD-10-CM | POA: Diagnosis not present

## 2017-09-06 DIAGNOSIS — G8929 Other chronic pain: Secondary | ICD-10-CM | POA: Diagnosis not present

## 2017-09-06 MED ORDER — METHYLPREDNISOLONE ACETATE 80 MG/ML IJ SUSP
40.0000 mg | Freq: Once | INTRAMUSCULAR | Status: AC
  Administered 2017-09-06: 40 mg via INTRAMUSCULAR

## 2017-09-06 NOTE — Addendum Note (Signed)
Addended byCarrolyn Leigh on: 09/06/2017 04:32 PM   Modules accepted: Orders

## 2017-09-06 NOTE — Progress Notes (Signed)
Subjective: CC: left knee pain PCP: Terald Sleeper, PA-C Whitney Ross is a 62 y.o. female presenting to clinic today for:  1. Left knee pain Patient with a 4-1/37-month history of left-sided knee pain.  She notes that typically is either at the anterior aspect of the knee or posteriorly when she is laying down.  When she was evaluated in March, there was concern for possible DVT given posterior knee pain.  She had a ultrasound performed that ruled out DVT.  She notes that the naproxen seem to help it for a while but then she started exercising again recently and her knee has ached quite a bit.  She is use the naproxen, gone to the chiropractor, used ice and used CBD oil with little improvement in symptoms.  The knee pain kept her up all night.  Denies any weakness or leg giving out.  No preceding trauma.  No neurologic changes.   ROS: Per HPI  Allergies  Allergen Reactions  . Cortisporin [Bacitra-Neomycin-Polymyxin-Hc]    Past Medical History:  Diagnosis Date  . Chronic kidney disease    hx muscle stones  . Elevated cholesterol   . Hypertension   . Lumbago   . Neck pain   . Nephrolithiasis     Current Outpatient Medications:  .  ALPRAZolam (XANAX) 0.5 MG tablet, Take 1 tablet (0.5 mg total) by mouth at bedtime as needed for anxiety., Disp: 90 tablet, Rfl: 1 .  amoxicillin-clavulanate (AUGMENTIN) 875-125 MG tablet, Take 1 tablet by mouth 2 (two) times daily., Disp: 20 tablet, Rfl: 0 .  diazepam (VALIUM) 5 MG tablet, TAKE ONE TABLET BY MOUTH EVERY 12 HOURS AS NEEDED FOR ANXIETY, Disp: 30 tablet, Rfl: 1 .  fluticasone (FLONASE) 50 MCG/ACT nasal spray, Place 2 sprays into both nostrils daily., Disp: 1 g, Rfl: 11 .  lisinopril-hydrochlorothiazide (ZESTORETIC) 20-12.5 MG tablet, Take 1 tablet by mouth daily., Disp: 90 tablet, Rfl: 3 .  loratadine (CLARITIN) 10 MG tablet, Take 1 tablet (10 mg total) daily by mouth., Disp: 90 tablet, Rfl: 3 .  naproxen (NAPROSYN) 500 MG tablet,  Take 1 tablet (500 mg total) by mouth 2 (two) times daily with a meal., Disp: 60 tablet, Rfl: 1 .  pantoprazole (PROTONIX) 40 MG tablet, Take 1 tablet (40 mg total) by mouth daily., Disp: 90 tablet, Rfl: 3 .  pravastatin (PRAVACHOL) 20 MG tablet, Take 1 tablet (20 mg total) by mouth daily., Disp: 90 tablet, Rfl: 3 Social History   Socioeconomic History  . Marital status: Married    Spouse name: Not on file  . Number of children: Not on file  . Years of education: Not on file  . Highest education level: Not on file  Occupational History  . Not on file  Social Needs  . Financial resource strain: Not on file  . Food insecurity:    Worry: Not on file    Inability: Not on file  . Transportation needs:    Medical: Not on file    Non-medical: Not on file  Tobacco Use  . Smoking status: Current Every Day Smoker    Packs/day: 1.00  . Smokeless tobacco: Never Used  Substance and Sexual Activity  . Alcohol use: No    Comment: RARELY  . Drug use: No  . Sexual activity: Not Currently  Lifestyle  . Physical activity:    Days per week: Not on file    Minutes per session: Not on file  . Stress: Not on file  Relationships  . Social connections:    Talks on phone: Not on file    Gets together: Not on file    Attends religious service: Not on file    Active member of club or organization: Not on file    Attends meetings of clubs or organizations: Not on file    Relationship status: Not on file  . Intimate partner violence:    Fear of current or ex partner: Not on file    Emotionally abused: Not on file    Physically abused: Not on file    Forced sexual activity: Not on file  Other Topics Concern  . Not on file  Social History Narrative  . Not on file   Family History  Problem Relation Age of Onset  . Heart failure Mother   . Cancer Father        MELENOMA  . Hypertension Sister   . Hyperlipidemia Sister   . Hyperlipidemia Brother   . Hypertension Brother   . Colon cancer Neg  Hx   . Esophageal cancer Neg Hx   . Stomach cancer Neg Hx   . Rectal cancer Neg Hx     Objective: Office vital signs reviewed. BP 122/75   Pulse 77   Temp 97.8 F (36.6 C) (Oral)   Ht 5\' 5"  (1.651 m)   Wt 210 lb (95.3 kg)   BMI 34.95 kg/m   Physical Examination:  General: Awake, alert, well nourished, No acute distress Extremities: warm, well perfused, No edema, cyanosis or clubbing; +2 pulses bilaterally MSK: normal gait and normal station  Left knee: No appreciable soft tissue swelling, discoloration or gross joint effusions.  No tenderness to palpation to the patella, patella tendon, quad tendon, joint line or posterior popliteal fossa.  No popliteal masses.  No ligamentous laxity.  Negative Thessaly.  No palpable bony abnormalities. Skin: dry; intact; no rashes or lesions Neuro: 5/5 LE Strength and light touch sensation grossly intact  JOINT INJECTION:  Patient denies allergy to antiseptics (including iodine) and anesthetics.  Patient denies h/o diabetes, frequent steroid use, use of blood thinners/ antiplatelets.  Patient was given informed consent and a signed copy has been placed in the chart. Appropriate time out was taken. Area prepped and draped in usual sterile fashion. Anatomic landmarks were identified and injection site was marked.  Ethyl chloride spray was used to numb the area and 1 cc of methylprednisolone 40 mg/ml plus  3 cc of 1% lidocaine without epinephrine was injected into the left knee using a(n) anteriorlateral approach. The patient tolerated the procedure well and there were no immediate complications. Estimated blood loss is less than 1 cc.  Post procedure instructions were reviewed and handout outlining these instructions were provided to patient.   Assessment/ Plan: 62 y.o. female   1. Chronic pain of left knee Physical exam was fairly unremarkable.  Given chronic knee pain, she was provided a corticosteroid injection.  Informed consent was provided  and a signed copy of the consent form placed in the EMR.  She tolerated procedure well.  No immediate complications.  Home care instructions were reviewed.  Reasons for return and emergent evaluation discussed.  Follow-up with PCP as needed physical.   Whitney Ross, [place] [phone]

## 2017-09-06 NOTE — Patient Instructions (Signed)
Knee Injection, Care After  Refer to this sheet in the next few weeks. These instructions provide you with information about caring for yourself after your procedure. Your health care provider may also give you more specific instructions. Your treatment has been planned according to current medical practices, but problems sometimes occur. Call your health care provider if you have any problems or questions after your procedure.  What can I expect after the procedure?  After the procedure, it is common to have:   Soreness.   Warmth.   Swelling.    You may have more pain, swelling, and warmth than you did before the injection. This reaction may last for about one day.  Follow these instructions at home:  Bathing   If you were given a bandage (dressing), keep it dry until your health care provider says it can be removed. Ask your health care provider when you can start showering or taking a bath.  Managing pain, stiffness, and swelling   If directed, apply ice to the injection area:  ? Put ice in a plastic bag.  ? Place a towel between your skin and the bag.  ? Leave the ice on for 20 minutes, 2-3 times per day.   Do not apply heat to your knee.   Raise the injection area above the level of your heart while you are sitting or lying down.  Activity   Avoid strenuous activities for as long as directed by your health care provider. Ask your health care provider when you can return to your normal activities.  General instructions   Take medicines only as directed by your health care provider.   Do not take aspirin or other over-the-counter medicines unless your health care provider says you can.   Check your injection site every day for signs of infection. Watch for:  ? Redness, swelling, or pain.  ? Fluid, blood, or pus.   Follow your health care provider's instructions about dressing changes and removal.  Contact a health care provider if:   You have symptoms at your injection site that last longer than  two days after your procedure.   You have redness, swelling, or pain in your injection area.   You have fluid, blood, or pus coming from your injection site.   You have warmth in your injection area.   You have a fever.   Your pain is not controlled with medicine.  Get help right away if:   Your knee turns very red.   Your knee becomes very swollen.   Your knee pain is severe.  This information is not intended to replace advice given to you by your health care provider. Make sure you discuss any questions you have with your health care provider.  Document Released: 03/01/2014 Document Revised: 10/15/2015 Document Reviewed: 12/19/2013  [employer]vier Interactive Patient Education  2018 [employer]

## 2017-09-12 ENCOUNTER — Encounter: Payer: Self-pay | Admitting: Physician Assistant

## 2017-09-12 ENCOUNTER — Ambulatory Visit (INDEPENDENT_AMBULATORY_CARE_PROVIDER_SITE_OTHER): Payer: BC Managed Care – PPO | Admitting: Physician Assistant

## 2017-09-12 VITALS — BP 115/70 | HR 80 | Temp 97.9°F | Ht 65.0 in | Wt 206.0 lb

## 2017-09-12 DIAGNOSIS — Z0000 Encounter for general adult medical examination without abnormal findings: Secondary | ICD-10-CM | POA: Diagnosis not present

## 2017-09-12 MED ORDER — CLOBETASOL PROPIONATE 0.05 % EX OINT: 1.0000 "application " | TOPICAL_OINTMENT | Freq: Two times a day (BID) | CUTANEOUS | 0 refills | Status: DC

## 2017-09-12 MED ORDER — ALPRAZOLAM 0.5 MG PO TABS: 0.5000 mg | ORAL_TABLET | Freq: Every evening | ORAL | 5 refills | Status: DC | PRN

## 2017-09-13 NOTE — Progress Notes (Signed)
BP 115/70   Pulse 80   Temp 97.9 F (36.6 C) (Oral)   Ht 5\' 5"  (1.651 m)   Wt 206 lb (93.4 kg)   BMI 34.28 kg/m    Subjective:    Patient ID: Whitney Ross, female    DOB: 1955-05-28, 62 y.o.   MRN: [id number]  HPI: ODALY PERI is a 62 y.o. female presenting on 09/12/2017 for Annual Exam  This patient comes in for annual well physical examination. All medications are reviewed today. There are no reports of any problems with the medications. All of the medical conditions are reviewed and updated.  Lab work is reviewed and will be ordered as medically necessary. There are no new problems reported with today's visit.  Patient reports doing well overall.   Past Medical History:  Diagnosis Date  . Chronic kidney disease    hx muscle stones  . Elevated cholesterol   . Hypertension   . Lumbago   . Neck pain   . Nephrolithiasis    Relevant past medical, surgical, family and social history reviewed and updated as indicated. Interim medical history since our last visit reviewed. Allergies and medications reviewed and updated. DATA REVIEWED: CHART IN EPIC  Family History reviewed for pertinent findings.  Review of Systems  Constitutional: Negative.  Negative for activity change, fatigue and fever.  HENT: Negative.   Eyes: Negative.   Respiratory: Negative.  Negative for cough.   Cardiovascular: Negative.  Negative for chest pain.  Gastrointestinal: Negative.  Negative for abdominal pain.  Endocrine: Negative.   Genitourinary: Negative.  Negative for dysuria.  Musculoskeletal: Negative.   Skin: Negative.   Neurological: Negative.     Allergies as of 09/12/2017      Reactions   Cortisporin [bacitra-neomycin-polymyxin-hc]       Medication List        Accurate as of 09/12/17 11:59 PM. Always use your most recent med list.          ALPRAZolam 0.5 MG tablet Commonly known as:  XANAX Take 1 tablet (0.5 mg total) by mouth at bedtime as needed for anxiety.     amoxicillin-clavulanate 875-125 MG tablet Commonly known as:  AUGMENTIN Take 1 tablet by mouth 2 (two) times daily.   clobetasol ointment 0.05 % Commonly known as:  TEMOVATE Apply 1 application topically 2 (two) times daily.   diazepam 5 MG tablet Commonly known as:  VALIUM TAKE ONE TABLET BY MOUTH EVERY 12 HOURS AS NEEDED FOR ANXIETY   fluticasone 50 MCG/ACT nasal spray Commonly known as:  FLONASE Place 2 sprays into both nostrils daily.   lisinopril-hydrochlorothiazide 20-12.5 MG tablet Commonly known as:  ZESTORETIC Take 1 tablet by mouth daily.   loratadine 10 MG tablet Commonly known as:  CLARITIN Take 1 tablet (10 mg total) daily by mouth.   naproxen 500 MG tablet Commonly known as:  NAPROSYN Take 1 tablet (500 mg total) by mouth 2 (two) times daily with a meal.   pantoprazole 40 MG tablet Commonly known as:  PROTONIX Take 1 tablet (40 mg total) by mouth daily.   pravastatin 20 MG tablet Commonly known as:  PRAVACHOL Take 1 tablet (20 mg total) by mouth daily.          Objective:    BP 115/70   Pulse 80   Temp 97.9 F (36.6 C) (Oral)   Ht 5\' 5"  (1.651 m)   Wt 206 lb (93.4 kg)   BMI 34.28 kg/m   Allergies  Allergen Reactions  . Cortisporin [Bacitra-Neomycin-Polymyxin-Hc]     Wt Readings from Last 3 Encounters:  09/12/17 206 lb (93.4 kg)  09/06/17 210 lb (95.3 kg)  08/08/17 208 lb (94.3 kg)    Physical Exam  Constitutional: She is oriented to person, place, and time. She appears well-developed and well-nourished.  HENT:  Head: Normocephalic and atraumatic.  Eyes: Pupils are equal, round, and reactive to light. Conjunctivae and EOM are normal.  Neck: Normal range of motion. Neck supple.  Cardiovascular: Normal rate, regular rhythm, normal heart sounds and intact distal pulses.  Pulmonary/Chest: Effort normal and breath sounds normal. Right breast exhibits no mass, no skin change and no tenderness. Left breast exhibits no mass, no skin change and  no tenderness. No breast tenderness, discharge or bleeding. Breasts are symmetrical.  Abdominal: Soft. Bowel sounds are normal.  Genitourinary: Vagina normal and uterus normal. Rectal exam shows no fissure. No breast tenderness, discharge or bleeding. There is no tenderness or lesion on the right labia. There is no tenderness or lesion on the left labia. Uterus is not deviated, not enlarged and not tender. Cervix exhibits no motion tenderness, no discharge and no friability. Right adnexum displays no mass, no tenderness and no fullness. Left adnexum displays no mass, no tenderness and no fullness. No tenderness or bleeding in the vagina. No vaginal discharge found.  Neurological: She is alert and oriented to person, place, and time. She has normal reflexes.  Skin: Skin is warm and dry. No rash noted.  Psychiatric: She has a normal mood and affect. Her behavior is normal. Judgment and thought content normal.        Assessment & Plan:   1. Well adult exam - IGP, Aptima HPV, rfx 16/18,45   Continue all other maintenance medications as listed above.  Follow up plan: Recheck 1 year  Educational handout given for [place] PA-C [facility] [address]  [place], [place] [address] [phone]   09/13/2017, 12:30 PM

## 2017-09-14 LAB — IGP, APTIMA HPV, RFX 16/18,45
HPV Aptima: NEGATIVE
PAP Smear Comment: 0

## 2018-01-02 ENCOUNTER — Encounter: Payer: Self-pay | Admitting: Physician Assistant

## 2018-01-02 ENCOUNTER — Ambulatory Visit: Payer: BC Managed Care – PPO | Admitting: Physician Assistant

## 2018-01-02 VITALS — BP 128/89 | HR 72 | Temp 98.0°F | Ht 65.0 in | Wt 209.2 lb

## 2018-01-02 DIAGNOSIS — F419 Anxiety disorder, unspecified: Secondary | ICD-10-CM | POA: Diagnosis not present

## 2018-01-02 DIAGNOSIS — K047 Periapical abscess without sinus: Secondary | ICD-10-CM

## 2018-01-02 DIAGNOSIS — T753XXD Motion sickness, subsequent encounter: Secondary | ICD-10-CM

## 2018-01-02 MED ORDER — PENICILLIN V POTASSIUM 500 MG PO TABS: 500.0000 mg | ORAL_TABLET | Freq: Four times a day (QID) | ORAL | 1 refills | Status: DC

## 2018-01-02 MED ORDER — ALPRAZOLAM 0.5 MG PO TABS: 0.5000 mg | ORAL_TABLET | Freq: Every evening | ORAL | 5 refills | Status: DC | PRN

## 2018-01-02 MED ORDER — LORATADINE 10 MG PO TABS: 10.0000 mg | ORAL_TABLET | Freq: Every day | ORAL | 3 refills | Status: DC

## 2018-01-02 MED ORDER — DIAZEPAM 5 MG PO TABS: ORAL_TABLET | ORAL | 1 refills | Status: DC

## 2018-01-04 DIAGNOSIS — F419 Anxiety disorder, unspecified: Secondary | ICD-10-CM | POA: Insufficient documentation

## 2018-01-04 NOTE — Progress Notes (Signed)
BP 128/89   Pulse 72   Temp 98 F (36.7 C) (Oral)   Ht 5\' 5"  (1.651 m)   Wt 209 lb 3.2 oz (94.9 kg)   BMI 34.81 kg/m    Subjective:    Patient ID: Whitney Ross, female    DOB: September 12, 1955, 62 y.o.   MRN: [id number]  HPI: Whitney Ross is a 62 y.o. female presenting on 01/02/2018 for Hyperlipidemia (6 month follow up ); Hypertension; Medical Management of Chronic Issues; and sore in mouth  This patient comes in for a 11-month periodic recheck on her chronic medical conditions that do include osteoarthritis, allergic rhinitis, hyperlipidemia, hypertension, generalized anxiety, dizziness   All medications are reviewed today. There are no reports of any problems with the medications. All of the medical conditions are reviewed and updated.  Lab work is reviewed and will be ordered as medically necessary. There are no new problems reported with today's visit.   Past Medical History:  Diagnosis Date  . Chronic kidney disease    hx muscle stones  . Elevated cholesterol   . Hypertension   . Lumbago   . Neck pain   . Nephrolithiasis    Relevant past medical, surgical, family and social history reviewed and updated as indicated. Interim medical history since our last visit reviewed. Allergies and medications reviewed and updated. DATA REVIEWED: CHART IN EPIC  Family History reviewed for pertinent findings.  Review of Systems  Constitutional: Negative.  Negative for activity change, fatigue and fever.  HENT: Negative.   Eyes: Negative.   Respiratory: Negative.  Negative for cough.   Cardiovascular: Negative.  Negative for chest pain.  Gastrointestinal: Negative.  Negative for abdominal pain.  Endocrine: Negative.   Genitourinary: Negative.  Negative for dysuria.  Musculoskeletal: Negative.   Skin: Negative.   Neurological: Negative.     Allergies as of 01/02/2018      Reactions   Cortisporin [bacitra-neomycin-polymyxin-hc]       Medication List        Accurate as  of 01/02/18 11:59 PM. Always use your most recent med list.          ALPRAZolam 0.5 MG tablet Commonly known as:  XANAX Take 1 tablet (0.5 mg total) by mouth at bedtime as needed for anxiety.   clobetasol ointment 0.05 % Commonly known as:  TEMOVATE Apply 1 application topically 2 (two) times daily.   diazepam 5 MG tablet Commonly known as:  VALIUM TAKE ONE TABLET BY MOUTH EVERY 12 HOURS AS NEEDED FOR ANXIETY   fluticasone 50 MCG/ACT nasal spray Commonly known as:  FLONASE Place 2 sprays into both nostrils daily.   lisinopril-hydrochlorothiazide 20-12.5 MG tablet Commonly known as:  PRINZIDE,ZESTORETIC Take 1 tablet by mouth daily.   loratadine 10 MG tablet Commonly known as:  CLARITIN Take 1 tablet (10 mg total) by mouth daily.   pantoprazole 40 MG tablet Commonly known as:  PROTONIX Take 1 tablet (40 mg total) by mouth daily.   penicillin v potassium 500 MG tablet Commonly known as:  VEETID Take 1 tablet (500 mg total) by mouth 4 (four) times daily.   pravastatin 20 MG tablet Commonly known as:  PRAVACHOL Take 1 tablet (20 mg total) by mouth daily.          Objective:    BP 128/89   Pulse 72   Temp 98 F (36.7 C) (Oral)   Ht 5\' 5"  (1.651 m)   Wt 209 lb 3.2 oz (94.9 kg)  BMI 34.81 kg/m   Allergies  Allergen Reactions  . Cortisporin [Bacitra-Neomycin-Polymyxin-Hc]     Wt Readings from Last 3 Encounters:  01/02/18 209 lb 3.2 oz (94.9 kg)  09/12/17 206 lb (93.4 kg)  09/06/17 210 lb (95.3 kg)    Physical Exam  Constitutional: She is oriented to person, place, and time. She appears well-developed and well-nourished.  HENT:  Head: Normocephalic and atraumatic.  Eyes: Pupils are equal, round, and reactive to light. Conjunctivae and EOM are normal.  Cardiovascular: Normal rate, regular rhythm, normal heart sounds and intact distal pulses.  Pulmonary/Chest: Effort normal and breath sounds normal.  Abdominal: Soft. Bowel sounds are normal.    Neurological: She is alert and oriented to person, place, and time. She has normal reflexes.  Skin: Skin is warm and dry. No rash noted.  Psychiatric: She has a normal mood and affect. Her behavior is normal. Judgment and thought content normal.        Assessment & Plan:   1. Motion sickness, subsequent encounter - diazepam (VALIUM) 5 MG tablet; TAKE ONE TABLET BY MOUTH EVERY 12 HOURS AS NEEDED FOR ANXIETY  Dispense: 30 tablet; Refill: 1  2. Anxiety - ALPRAZolam (XANAX) 0.5 MG tablet; Take 1 tablet (0.5 mg total) by mouth at bedtime as needed for anxiety.  Dispense: 90 tablet; Refill: 5  3. Dental infection - penicillin v potassium (VEETID) 500 MG tablet; Take 1 tablet (500 mg total) by mouth 4 (four) times daily.  Dispense: 40 tablet; Refill: 1   Continue all other maintenance medications as listed above.  Follow up plan: Return in about 6 months (around 07/03/2018) for recheck.  Educational handout given for [place] PA-C [facility] [address]  [place], [place] [address] [phone]   01/04/2018, 1:15 PM

## 2018-02-02 ENCOUNTER — Telehealth: Payer: Self-pay | Admitting: *Deleted

## 2018-02-02 ENCOUNTER — Other Ambulatory Visit: Payer: Self-pay | Admitting: Family Medicine

## 2018-02-02 MED ORDER — IBUPROFEN 800 MG PO TABS: 800.0000 mg | ORAL_TABLET | Freq: Three times a day (TID) | ORAL | 0 refills | Status: DC | PRN

## 2018-02-02 NOTE — Telephone Encounter (Signed)
sent 

## 2018-02-02 NOTE — Telephone Encounter (Signed)
Pt is leaving for [place] tomorrow for a wedding. Could you possibly call in Ibuprofen 800 for her left knee, in a lot of pain. [facility]

## 2018-02-06 ENCOUNTER — Other Ambulatory Visit: Payer: Self-pay | Admitting: Physician Assistant

## 2018-02-06 NOTE — Telephone Encounter (Signed)
Pt aware and picked up on 02/02/18

## 2018-02-06 NOTE — Telephone Encounter (Signed)
You cannot take both prednisone and ibuprofen. Both work as Lexicographer. Which one seems to work better?

## 2018-02-06 NOTE — Telephone Encounter (Signed)
What is the name of the medication? Prednisone Dr. Lajuana Ripple put her on Ibuprofen 800 and don't know which one is really helping. Friend gave her some prednisone and she was taking both and made her knee feel better  Have you contacted your pharmacy to request a refill? NO  Which pharmacy would you like this sent to? [facility] in [place]   Patient notified that their request is being sent to the clinical staff for review and that they should receive a call once it is complete. If they do not receive a call within 24 hours they can check with their pharmacy or our office.

## 2018-02-07 ENCOUNTER — Other Ambulatory Visit: Payer: Self-pay | Admitting: Physician Assistant

## 2018-02-07 MED ORDER — PREDNISONE 10 MG (48) PO TBPK: ORAL_TABLET | ORAL | 0 refills | Status: DC

## 2018-02-07 NOTE — Telephone Encounter (Signed)
sent 

## 2018-02-07 NOTE — Telephone Encounter (Signed)
Pt states she thinks the prednisone works better and will stop Ibuprofen. She needs prednisone sent into pharmacy please .

## 2018-02-07 NOTE — Telephone Encounter (Signed)
Pt aware.

## 2018-03-17 ENCOUNTER — Encounter: Payer: Self-pay | Admitting: Physician Assistant

## 2018-03-17 ENCOUNTER — Ambulatory Visit: Payer: BC Managed Care – PPO | Admitting: Physician Assistant

## 2018-03-17 VITALS — BP 122/80 | HR 81 | Temp 97.9°F | Ht 65.0 in | Wt 208.4 lb

## 2018-03-17 DIAGNOSIS — G8929 Other chronic pain: Secondary | ICD-10-CM | POA: Diagnosis not present

## 2018-03-17 DIAGNOSIS — M25562 Pain in left knee: Secondary | ICD-10-CM

## 2018-03-17 DIAGNOSIS — K047 Periapical abscess without sinus: Secondary | ICD-10-CM

## 2018-03-17 MED ORDER — METHYLPREDNISOLONE ACETATE 80 MG/ML IJ SUSP
80.0000 mg | Freq: Once | INTRAMUSCULAR | Status: AC
  Administered 2018-03-17: 80 mg via INTRAMUSCULAR

## 2018-03-17 MED ORDER — PENICILLIN V POTASSIUM 500 MG PO TABS: 500.0000 mg | ORAL_TABLET | Freq: Four times a day (QID) | ORAL | 1 refills | Status: DC

## 2018-03-20 NOTE — Progress Notes (Signed)
BP 122/80   Pulse 81   Temp 97.9 F (36.6 C) (Oral)   Ht 5\' 5"  (1.651 m)   Wt 208 lb 6.4 oz (94.5 kg)   BMI 34.68 kg/m    Subjective:    Patient ID: Whitney Ross, female    DOB: [birth year], 63 y.o.   MRN: [id number]  HPI: Whitney Ross is a 63 y.o. female presenting on 03/17/2018 for Knee Pain (left) This patient comes in for periodic recheck on medications and conditions including chronic joint pain and knee pain. It is made worse after standing in her barn for hours. She has no specific injury.  Also has dental abscess and is trying to get to her dentist..   All medications are reviewed today. There are no reports of any problems with the medications. All of the medical conditions are reviewed and updated.  Lab work is reviewed and will be ordered as medically necessary. There are no new problems reported with today's visit.    Past Medical History:  Diagnosis Date  . Chronic kidney disease    hx muscle stones  . Elevated cholesterol   . Hypertension   . Lumbago   . Neck pain   . Nephrolithiasis    Relevant past medical, surgical, family and social history reviewed and updated as indicated. Interim medical history since our last visit reviewed. Allergies and medications reviewed and updated. DATA REVIEWED: CHART IN EPIC  Family History reviewed for pertinent findings.  Review of Systems  Constitutional: Negative.   HENT: Negative.   Eyes: Negative.   Respiratory: Negative.   Gastrointestinal: Negative.   Genitourinary: Negative.   Musculoskeletal: Positive for arthralgias, back pain and joint swelling.    Allergies as of 03/17/2018      Reactions   Cortisporin [bacitra-neomycin-polymyxin-hc]       Medication List       Accurate as of March 17, 2018 11:59 PM. Always use your most recent med list.        ALPRAZolam 0.5 MG tablet Commonly known as:  XANAX Take 1 tablet (0.5 mg total) by mouth at bedtime as needed for anxiety.   clobetasol  ointment 0.05 % Commonly known as:  TEMOVATE Apply 1 application topically 2 (two) times daily.   diazepam 5 MG tablet Commonly known as:  VALIUM TAKE ONE TABLET BY MOUTH EVERY 12 HOURS AS NEEDED FOR ANXIETY   fluticasone 50 MCG/ACT nasal spray Commonly known as:  FLONASE Place 2 sprays into both nostrils daily.   ibuprofen 800 MG tablet Commonly known as:  ADVIL,MOTRIN Take 1 tablet (800 mg total) by mouth every 8 (eight) hours as needed for moderate pain.   lisinopril-hydrochlorothiazide 20-12.5 MG tablet Commonly known as:  ZESTORETIC Take 1 tablet by mouth daily.   loratadine 10 MG tablet Commonly known as:  CLARITIN Take 1 tablet (10 mg total) by mouth daily.   pantoprazole 40 MG tablet Commonly known as:  PROTONIX Take 1 tablet (40 mg total) by mouth daily.   penicillin v potassium 500 MG tablet Commonly known as:  VEETID Take 1 tablet (500 mg total) by mouth 4 (four) times daily.   pravastatin 20 MG tablet Commonly known as:  PRAVACHOL Take 1 tablet (20 mg total) by mouth daily.          Objective:    BP 122/80   Pulse 81   Temp 97.9 F (36.6 C) (Oral)   Ht 5\' 5"  (1.651 m)   Wt 208 lb 6.4  oz (94.5 kg)   BMI 34.68 kg/m   Allergies  Allergen Reactions  . Cortisporin [Bacitra-Neomycin-Polymyxin-Hc]     Wt Readings from Last 3 Encounters:  03/17/18 208 lb 6.4 oz (94.5 kg)  01/02/18 209 lb 3.2 oz (94.9 kg)  09/12/17 206 lb (93.4 kg)    Physical Exam Constitutional:      Appearance: She is well-developed.  HENT:     Head: Normocephalic and atraumatic.  Eyes:     Conjunctiva/sclera: Conjunctivae normal.     Pupils: Pupils are equal, round, and reactive to light.  Cardiovascular:     Rate and Rhythm: Normal rate and regular rhythm.     Heart sounds: Normal heart sounds.  Pulmonary:     Effort: Pulmonary effort is normal.     Breath sounds: Normal breath sounds.  Abdominal:     General: Bowel sounds are normal.     Palpations: Abdomen is soft.   Skin:    General: Skin is warm and dry.     Findings: No rash.  Neurological:     Mental Status: She is alert and oriented to person, place, and time.     Deep Tendon Reflexes: Reflexes are normal and symmetric.  Psychiatric:        Behavior: Behavior normal.        Thought Content: Thought content normal.        Judgment: Judgment normal.     Results for orders placed or performed in visit on 09/12/17  IGP, Aptima HPV, rfx 16/18,45  Result Value Ref Range   DIAGNOSIS: Comment    Specimen adequacy: Comment    Clinician Provided ICD10 Comment    Performed by: Comment    PAP Smear Comment .    Note: Comment    Test Methodology Comment    HPV Aptima Negative Negative      Assessment & Plan:   1. Chronic pain of left knee - Ambulatory referral to Orthopedic Surgery - methylPREDNISolone acetate (DEPO-MEDROL) injection 80 mg  2. Dental infection - penicillin v potassium (VEETID) 500 MG tablet; Take 1 tablet (500 mg total) by mouth 4 (four) times daily.  Dispense: 40 tablet; Refill: 1   Continue all other maintenance medications as listed above.  Follow up plan: No follow-ups on file.  Educational handout given for [place] PA-C [facility] [address]  [place], [place] [address] [phone]   03/20/2018, 6:14 PM

## 2018-03-23 ENCOUNTER — Ambulatory Visit: Payer: BC Managed Care – PPO | Admitting: Physician Assistant

## 2018-03-23 ENCOUNTER — Encounter: Payer: Self-pay | Admitting: Physician Assistant

## 2018-03-23 VITALS — BP 115/82 | HR 69 | Temp 97.9°F | Ht 65.0 in | Wt 208.0 lb

## 2018-03-23 DIAGNOSIS — Z0000 Encounter for general adult medical examination without abnormal findings: Secondary | ICD-10-CM

## 2018-03-23 DIAGNOSIS — E010 Iodine-deficiency related diffuse (endemic) goiter: Secondary | ICD-10-CM | POA: Diagnosis not present

## 2018-03-23 NOTE — Progress Notes (Signed)
BP 115/82   Pulse 69   Temp 97.9 F (36.6 C) (Oral)   Ht '5\' 5"'  (1.651 m)   Wt 208 lb (94.3 kg)   BMI 34.61 kg/m    Subjective:    Patient ID: Whitney Ross, female    DOB: 1956-01-03, 63 y.o.   MRN: [id number]  HPI: Whitney Ross is a 63 y.o. female presenting on 03/23/2018 for No chief complaint on file.  This patient comes in after seeing her dentist yesterday.  While they were doing her examination they felt some fullness in her thyroid.  This was a new dentist to her.  She has never had a problem with her thyroid before and does not know of any dysfunction.  She does not know of any family history of thyroid disease or cancer.  We have had a discussion about needing to do labs and an ultrasound.  The patient is willing to proceed.  Past Medical History:  Diagnosis Date  . Chronic kidney disease    hx muscle stones  . Elevated cholesterol   . Hypertension   . Lumbago   . Neck pain   . Nephrolithiasis    Relevant past medical, surgical, family and social history reviewed and updated as indicated. Interim medical history since our last visit reviewed. Allergies and medications reviewed and updated. DATA REVIEWED: CHART IN EPIC  Family History reviewed for pertinent findings.  Review of Systems  Constitutional: Positive for fatigue.  HENT: Negative.  Negative for voice change.   Eyes: Negative.   Respiratory: Negative.   Gastrointestinal: Negative.   Endocrine: Negative.   Genitourinary: Negative.     Allergies as of 03/23/2018      Reactions   Cortisporin [bacitra-neomycin-polymyxin-hc]       Medication List       Accurate as of March 23, 2018 12:35 PM. Always use your most recent med list.        ALPRAZolam 0.5 MG tablet Commonly known as:  XANAX Take 1 tablet (0.5 mg total) by mouth at bedtime as needed for anxiety.   clobetasol ointment 0.05 % Commonly known as:  TEMOVATE Apply 1 application topically 2 (two) times daily.   diazepam 5 MG  tablet Commonly known as:  VALIUM TAKE ONE TABLET BY MOUTH EVERY 12 HOURS AS NEEDED FOR ANXIETY   fluticasone 50 MCG/ACT nasal spray Commonly known as:  FLONASE Place 2 sprays into both nostrils daily.   ibuprofen 800 MG tablet Commonly known as:  ADVIL,MOTRIN Take 1 tablet (800 mg total) by mouth every 8 (eight) hours as needed for moderate pain.   lisinopril-hydrochlorothiazide 20-12.5 MG tablet Commonly known as:  ZESTORETIC Take 1 tablet by mouth daily.   loratadine 10 MG tablet Commonly known as:  CLARITIN Take 1 tablet (10 mg total) by mouth daily.   pantoprazole 40 MG tablet Commonly known as:  PROTONIX Take 1 tablet (40 mg total) by mouth daily.   penicillin v potassium 500 MG tablet Commonly known as:  VEETID Take 1 tablet (500 mg total) by mouth 4 (four) times daily.   pravastatin 20 MG tablet Commonly known as:  PRAVACHOL Take 1 tablet (20 mg total) by mouth daily.          Objective:    BP 115/82   Pulse 69   Temp 97.9 F (36.6 C) (Oral)   Ht '5\' 5"'  (1.651 m)   Wt 208 lb (94.3 kg)   BMI 34.61 kg/m   Allergies  Allergen Reactions  . Cortisporin [Bacitra-Neomycin-Polymyxin-Hc]     Wt Readings from Last 3 Encounters:  03/23/18 208 lb (94.3 kg)  03/17/18 208 lb 6.4 oz (94.5 kg)  01/02/18 209 lb 3.2 oz (94.9 kg)    Physical Exam Constitutional:      Appearance: She is well-developed.  HENT:     Head: Normocephalic and atraumatic.  Eyes:     Conjunctiva/sclera: Conjunctivae normal.     Pupils: Pupils are equal, round, and reactive to light.  Neck:     Musculoskeletal: Full passive range of motion without pain, normal range of motion and neck supple.     Thyroid: Thyromegaly present. No thyroid mass or thyroid tenderness.   Cardiovascular:     Rate and Rhythm: Normal rate and regular rhythm.     Heart sounds: Normal heart sounds.  Pulmonary:     Effort: Pulmonary effort is normal.     Breath sounds: Normal breath sounds.  Abdominal:      General: Bowel sounds are normal.     Palpations: Abdomen is soft.  Lymphadenopathy:     Cervical: No cervical adenopathy.  Skin:    General: Skin is warm and dry.     Findings: No rash.  Neurological:     Mental Status: She is alert and oriented to person, place, and time.     Deep Tendon Reflexes: Reflexes are normal and symmetric.  Psychiatric:        Behavior: Behavior normal.        Thought Content: Thought content normal.        Judgment: Judgment normal.         Assessment & Plan:   1. Well adult exam - Thyroid Panel With TSH - CBC with Differential/Platelet - CMP14+EGFR - Lipid panel  2. Thyromegaly - US Soft Tissue Head/Neck; Future   Continue all other maintenance medications as listed above.  Follow up plan: No follow-ups on file.  Educational handout given for [place] PA-C [facility] [address]  [place], [place] [address] [phone]   03/23/2018, 12:35 PM

## 2018-03-24 LAB — CMP14+EGFR
ALK PHOS: 60 IU/L (ref 39–117)
ALT: 19 IU/L (ref 0–32)
AST: 16 IU/L (ref 0–40)
Albumin/Globulin Ratio: 1.7 (ref 1.2–2.2)
Albumin: 4.3 g/dL (ref 3.8–4.9)
BUN/Creatinine Ratio: 16 (ref 12–28)
BUN: 13 mg/dL (ref 8–27)
Bilirubin Total: 0.3 mg/dL (ref 0.0–1.2)
CHLORIDE: 98 mmol/L (ref 96–106)
CO2: 24 mmol/L (ref 20–29)
CREATININE: 0.81 mg/dL (ref 0.57–1.00)
Calcium: 9.1 mg/dL (ref 8.7–10.3)
GFR calc Af Amer: 91 mL/min/{1.73_m2} (ref >59)
GFR calc non Af Amer: 79 mL/min/{1.73_m2} (ref >59)
Globulin, Total: 2.5 g/dL (ref 1.5–4.5)
Glucose: 79 mg/dL (ref 65–99)
Potassium: 4.5 mmol/L (ref 3.5–5.2)
Sodium: 139 mmol/L (ref 134–144)
Total Protein: 6.8 g/dL (ref 6.0–8.5)

## 2018-03-24 LAB — CBC WITH DIFFERENTIAL/PLATELET
BASOS: 1 %
Basophils Absolute: 0.1 10*3/uL (ref 0.0–0.2)
EOS (ABSOLUTE): 0.1 10*3/uL (ref 0.0–0.4)
EOS: 1 %
Hematocrit: 41.9 % (ref 34.0–46.6)
Hemoglobin: 14.1 g/dL (ref 11.1–15.9)
Immature Grans (Abs): 0 10*3/uL (ref 0.0–0.1)
Immature Granulocytes: 0 %
LYMPHS ABS: 3.1 10*3/uL (ref 0.7–3.1)
Lymphs: 36 %
MCH: 30.5 pg (ref 26.6–33.0)
MCHC: 33.7 g/dL (ref 31.5–35.7)
MCV: 91 fL (ref 79–97)
MONOS ABS: 0.6 10*3/uL (ref 0.1–0.9)
Monocytes: 7 %
NEUTROS ABS: 4.7 10*3/uL (ref 1.4–7.0)
Neutrophils: 55 %
Platelets: 284 10*3/uL (ref 150–450)
RBC: 4.63 x10E6/uL (ref 3.77–5.28)
RDW: 12.7 % (ref 11.7–15.4)
WBC: 8.6 10*3/uL (ref 3.4–10.8)

## 2018-03-24 LAB — LIPID PANEL
CHOL/HDL RATIO: 3 ratio (ref 0.0–4.4)
Cholesterol, Total: 205 mg/dL — ABNORMAL HIGH (ref 100–199)
HDL: 68 mg/dL (ref >39)
LDL Calculated: 122 mg/dL — ABNORMAL HIGH (ref 0–99)
Triglycerides: 74 mg/dL (ref 0–149)
VLDL Cholesterol Cal: 15 mg/dL (ref 5–40)

## 2018-03-24 LAB — THYROID PANEL WITH TSH
Free Thyroxine Index: 2.7 (ref 1.2–4.9)
T3 Uptake Ratio: 28 % (ref 24–39)
T4 TOTAL: 9.5 ug/dL (ref 4.5–12.0)
TSH: 4.03 u[IU]/mL (ref 0.450–4.500)

## 2018-03-28 ENCOUNTER — Ambulatory Visit (HOSPITAL_COMMUNITY)
Admission: RE | Admit: 2018-03-28 | Discharge: 2018-03-28 | Disposition: A | Discharge status: Home / Self Care | Payer: BC Managed Care – PPO | Source: Ambulatory Visit | Attending: Physician Assistant | Admitting: Physician Assistant

## 2018-03-28 DIAGNOSIS — E010 Iodine-deficiency related diffuse (endemic) goiter: Secondary | ICD-10-CM | POA: Insufficient documentation

## 2018-05-10 ENCOUNTER — Encounter: Payer: Self-pay | Admitting: Physician Assistant

## 2018-05-11 ENCOUNTER — Encounter: Payer: Self-pay | Admitting: Physician Assistant

## 2018-05-11 ENCOUNTER — Other Ambulatory Visit: Payer: Self-pay | Admitting: Physician Assistant

## 2018-05-11 MED ORDER — AMOXICILLIN 500 MG PO CAPS: 500.0000 mg | ORAL_CAPSULE | Freq: Three times a day (TID) | ORAL | 0 refills | Status: DC

## 2018-06-30 ENCOUNTER — Other Ambulatory Visit: Payer: Self-pay | Admitting: *Deleted

## 2018-06-30 DIAGNOSIS — F419 Anxiety disorder, unspecified: Secondary | ICD-10-CM

## 2018-06-30 DIAGNOSIS — K219 Gastro-esophageal reflux disease without esophagitis: Secondary | ICD-10-CM

## 2018-06-30 DIAGNOSIS — J310 Chronic rhinitis: Secondary | ICD-10-CM

## 2018-06-30 DIAGNOSIS — I10 Essential (primary) hypertension: Secondary | ICD-10-CM

## 2018-06-30 DIAGNOSIS — T753XXD Motion sickness, subsequent encounter: Secondary | ICD-10-CM

## 2018-06-30 DIAGNOSIS — E7800 Pure hypercholesterolemia, unspecified: Secondary | ICD-10-CM

## 2018-06-30 MED ORDER — PANTOPRAZOLE SODIUM 40 MG PO TBEC: 40.0000 mg | DELAYED_RELEASE_TABLET | Freq: Every day | ORAL | 1 refills | Status: DC

## 2018-06-30 MED ORDER — LISINOPRIL-HYDROCHLOROTHIAZIDE 20-12.5 MG PO TABS: 1.0000 | ORAL_TABLET | Freq: Every day | ORAL | 1 refills | Status: DC

## 2018-06-30 MED ORDER — PRAVASTATIN SODIUM 20 MG PO TABS: 20.0000 mg | ORAL_TABLET | Freq: Every day | ORAL | 1 refills | Status: DC

## 2018-06-30 MED ORDER — FLUTICASONE PROPIONATE 50 MCG/ACT NA SUSP: 2.0000 | Freq: Every day | NASAL | 0 refills | Status: DC

## 2018-07-18 ENCOUNTER — Other Ambulatory Visit: Payer: Self-pay

## 2018-07-19 ENCOUNTER — Encounter: Payer: Self-pay | Admitting: Physician Assistant

## 2018-07-19 ENCOUNTER — Ambulatory Visit: Payer: BC Managed Care – PPO | Admitting: Physician Assistant

## 2018-07-19 DIAGNOSIS — T753XXD Motion sickness, subsequent encounter: Secondary | ICD-10-CM

## 2018-07-19 DIAGNOSIS — I10 Essential (primary) hypertension: Secondary | ICD-10-CM

## 2018-07-19 DIAGNOSIS — K219 Gastro-esophageal reflux disease without esophagitis: Secondary | ICD-10-CM | POA: Diagnosis not present

## 2018-07-19 DIAGNOSIS — F419 Anxiety disorder, unspecified: Secondary | ICD-10-CM | POA: Diagnosis not present

## 2018-07-19 DIAGNOSIS — J310 Chronic rhinitis: Secondary | ICD-10-CM

## 2018-07-19 DIAGNOSIS — E7800 Pure hypercholesterolemia, unspecified: Secondary | ICD-10-CM

## 2018-07-19 MED ORDER — ALBUTEROL SULFATE HFA 108 (90 BASE) MCG/ACT IN AERS: 2.0000 | INHALATION_SPRAY | Freq: Four times a day (QID) | RESPIRATORY_TRACT | 0 refills | Status: DC | PRN

## 2018-07-19 MED ORDER — FLUTICASONE PROPIONATE 50 MCG/ACT NA SUSP: 2.0000 | Freq: Every day | NASAL | 11 refills | Status: DC

## 2018-07-19 MED ORDER — IBUPROFEN 800 MG PO TABS: 800.0000 mg | ORAL_TABLET | Freq: Three times a day (TID) | ORAL | 2 refills | Status: DC | PRN

## 2018-07-19 MED ORDER — CETIRIZINE HCL 10 MG PO TABS: 10.0000 mg | ORAL_TABLET | Freq: Every day | ORAL | 11 refills | Status: DC

## 2018-07-19 MED ORDER — ALPRAZOLAM 0.5 MG PO TABS: 0.5000 mg | ORAL_TABLET | Freq: Every evening | ORAL | 5 refills | Status: DC | PRN

## 2018-07-19 MED ORDER — LISINOPRIL-HYDROCHLOROTHIAZIDE 20-12.5 MG PO TABS: 1.0000 | ORAL_TABLET | Freq: Every day | ORAL | 5 refills | Status: DC

## 2018-07-19 MED ORDER — DIAZEPAM 5 MG PO TABS: ORAL_TABLET | ORAL | 1 refills | Status: DC

## 2018-07-19 MED ORDER — PRAVASTATIN SODIUM 20 MG PO TABS: 20.0000 mg | ORAL_TABLET | Freq: Every day | ORAL | 5 refills | Status: DC

## 2018-07-19 MED ORDER — PANTOPRAZOLE SODIUM 40 MG PO TBEC: 40.0000 mg | DELAYED_RELEASE_TABLET | Freq: Every day | ORAL | 5 refills | Status: DC

## 2018-07-25 NOTE — Progress Notes (Signed)
BP 114/71   Pulse 75   Temp 98.4 F (36.9 C) (Oral)   Ht 5\' 5"  (1.651 m)   Wt 206 lb 6.4 oz (93.6 kg)   BMI 34.35 kg/m    Subjective:    Patient ID: Whitney Ross, female    DOB: 1955/03/28, 63 y.o.   MRN: [id number]  HPI: Whitney Ross is a 63 y.o. female presenting on 07/19/2018 for Pain (3 month )  Patient comes in for recheck on her arthritis and left knee injury.  She will be having surgery in coming weeks.  She is doing fairly well with her arthritis medicine.  However by halfway through the day her knee is hurting significantly and painful.  She does have the opportunity to go home and be off of it.  She is trying to ice it diligently.  Patient does need labs performed today, she does have hypertension, hyperlipidemia.  She does need refills on these medications.  She is having no difficulty with the medication.  Allergic rhinitis, this patient has had significant allergies this spring and has had to consistently take her Zyrtec and nasal spray.  She states it has controlled things very well but she does need refills on her medication.  ANXIETY ASSESSMENT Cause of anxiety: GAD This patient returns for a  month recheck on narcotic use for the above named condition(s)  Current medications- alprazolam 0.5 mg 1/2-1 tab BID prn anxiety Other medications tried: multiple SSRI Medication side effects- no Any concerns- no Any change in general medical condition- no Effectiveness of current meds- good PMP AWARE website reviewed: Yes Any suspicious activity on PMP Aware: No LME daily dose: 1     Past Medical History:  Diagnosis Date  . Chronic kidney disease    hx muscle stones  . Elevated cholesterol   . Hypertension   . Lumbago   . Neck pain   . Nephrolithiasis    Relevant past medical, surgical, family and social history reviewed and updated as indicated. Interim medical history since our last visit reviewed. Allergies and medications reviewed and updated.  DATA REVIEWED: CHART IN EPIC  Family History reviewed for pertinent findings.  Review of Systems  Constitutional: Negative.   HENT: Negative.   Eyes: Negative.   Respiratory: Negative.   Gastrointestinal: Negative.   Genitourinary: Negative.   Musculoskeletal: Positive for arthralgias, joint swelling and myalgias.  Psychiatric/Behavioral: Positive for sleep disturbance. The patient is nervous/anxious.     Allergies as of 07/19/2018      Reactions   Cortisporin [bacitra-neomycin-polymyxin-hc]       Medication List       Accurate as of Jul 19, 2018 11:59 PM. If you have any questions, ask your nurse or doctor.        STOP taking these medications   amoxicillin 500 MG capsule Commonly known as:  AMOXIL Stopped by:  Terald Sleeper, PA-C   penicillin v potassium 500 MG tablet Commonly known as:  VEETID Stopped by:  Terald Sleeper, PA-C     TAKE these medications   albuterol 108 (90 Base) MCG/ACT inhaler Commonly known as:  VENTOLIN HFA Inhale 2 puffs into the lungs every 6 (six) hours as needed for wheezing or shortness of breath. Started by:  Terald Sleeper, PA-C   ALPRAZolam 0.5 MG tablet Commonly known as:  Xanax Take 1 tablet (0.5 mg total) by mouth at bedtime as needed for anxiety.   cetirizine 10 MG tablet Commonly known as:  ZYRTEC Take 1 tablet (10 mg total) by mouth daily. Started by:  Terald Sleeper, PA-C   clobetasol ointment 0.05 % Commonly known as:  TEMOVATE Apply 1 application topically 2 (two) times daily.   diazepam 5 MG tablet Commonly known as:  VALIUM TAKE ONE TABLET BY MOUTH EVERY 12 HOURS AS NEEDED FOR DIZZINESS What changed:  additional instructions Changed by:  Terald Sleeper, PA-C   fluticasone 50 MCG/ACT nasal spray Commonly known as:  FLONASE Place 2 sprays into both nostrils daily.   ibuprofen 800 MG tablet Commonly known as:  ADVIL Take 1 tablet (800 mg total) by mouth every 8 (eight) hours as needed for moderate pain.    lisinopril-hydrochlorothiazide 20-12.5 MG tablet Commonly known as:  Zestoretic Take 1 tablet by mouth daily.   loratadine 10 MG tablet Commonly known as:  CLARITIN Take 1 tablet (10 mg total) by mouth daily.   pantoprazole 40 MG tablet Commonly known as:  PROTONIX Take 1 tablet (40 mg total) by mouth daily.   pravastatin 20 MG tablet Commonly known as:  PRAVACHOL Take 1 tablet (20 mg total) by mouth daily. (Needs to be seen before next refill)          Objective:    BP 114/71   Pulse 75   Temp 98.4 F (36.9 C) (Oral)   Ht 5\' 5"  (1.651 m)   Wt 206 lb 6.4 oz (93.6 kg)   BMI 34.35 kg/m   Allergies  Allergen Reactions  . Cortisporin [Bacitra-Neomycin-Polymyxin-Hc]     Wt Readings from Last 3 Encounters:  07/19/18 206 lb 6.4 oz (93.6 kg)  03/23/18 208 lb (94.3 kg)  03/17/18 208 lb 6.4 oz (94.5 kg)    Physical Exam Constitutional:      Appearance: She is well-developed.  HENT:     Head: Normocephalic and atraumatic.  Eyes:     Conjunctiva/sclera: Conjunctivae normal.     Pupils: Pupils are equal, round, and reactive to light.  Cardiovascular:     Rate and Rhythm: Normal rate and regular rhythm.     Heart sounds: Normal heart sounds.  Pulmonary:     Effort: Pulmonary effort is normal.     Breath sounds: Normal breath sounds.  Abdominal:     General: Bowel sounds are normal.     Palpations: Abdomen is soft.  Skin:    General: Skin is warm and dry.     Findings: No rash.  Neurological:     Mental Status: She is alert and oriented to person, place, and time.     Deep Tendon Reflexes: Reflexes are normal and symmetric.  Psychiatric:        Behavior: Behavior normal.        Thought Content: Thought content normal.        Judgment: Judgment normal.         Assessment & Plan:   1. Anxiety - ALPRAZolam (XANAX) 0.5 MG tablet; Take 1 tablet (0.5 mg total) by mouth at bedtime as needed for anxiety.  Dispense: 90 tablet; Refill: 5  2. Purulent rhinitis -  fluticasone (FLONASE) 50 MCG/ACT nasal spray; Place 2 sprays into both nostrils daily.  Dispense: 1 g; Refill: 11  3. Essential hypertension - lisinopril-hydrochlorothiazide (ZESTORETIC) 20-12.5 MG tablet; Take 1 tablet by mouth daily.  Dispense: 30 tablet; Refill: 5  4. Gastroesophageal reflux disease without esophagitis - pantoprazole (PROTONIX) 40 MG tablet; Take 1 tablet (40 mg total) by mouth daily.  Dispense: 30 tablet; Refill: 5  5. Pure hypercholesterolemia - pravastatin (PRAVACHOL) 20 MG tablet; Take 1 tablet (20 mg total) by mouth daily. (Needs to be seen before next refill)  Dispense: 30 tablet; Refill: 5  6. Motion sickness, subsequent encounter - diazepam (VALIUM) 5 MG tablet; TAKE ONE TABLET BY MOUTH EVERY 12 HOURS AS NEEDED FOR DIZZINESS  Dispense: 30 tablet; Refill: 1   Continue all other maintenance medications as listed above.  Follow up plan: Return in about 6 months (around 01/19/2019) for recheck.  Educational handout given for [place] PA-C [place] [address]  [place], [place] [address] [phone]   07/25/2018, 7:46 AM

## 2018-09-04 ENCOUNTER — Other Ambulatory Visit: Payer: Self-pay | Admitting: Physician Assistant

## 2018-09-26 ENCOUNTER — Encounter: Payer: Self-pay | Admitting: Family Medicine

## 2018-09-26 ENCOUNTER — Other Ambulatory Visit: Payer: Self-pay

## 2018-09-26 ENCOUNTER — Ambulatory Visit (INDEPENDENT_AMBULATORY_CARE_PROVIDER_SITE_OTHER): Payer: BC Managed Care – PPO | Admitting: Family Medicine

## 2018-09-26 DIAGNOSIS — J0100 Acute maxillary sinusitis, unspecified: Secondary | ICD-10-CM | POA: Diagnosis not present

## 2018-09-26 MED ORDER — PREDNISONE 20 MG PO TABS: ORAL_TABLET | ORAL | 0 refills | Status: DC

## 2018-09-26 MED ORDER — GUAIFENESIN ER 600 MG PO TB12: 600.0000 mg | ORAL_TABLET | Freq: Two times a day (BID) | ORAL | 0 refills | Status: AC

## 2018-09-26 MED ORDER — AMOXICILLIN-POT CLAVULANATE 875-125 MG PO TABS: 1.0000 | ORAL_TABLET | Freq: Two times a day (BID) | ORAL | 0 refills | Status: AC

## 2018-09-26 MED ORDER — FLUTICASONE PROPIONATE 50 MCG/ACT NA SUSP: 2.0000 | Freq: Every day | NASAL | 6 refills | Status: DC

## 2018-09-26 NOTE — Progress Notes (Signed)
Virtual Visit via telephone Note Due to COVID-19 pandemic this visit was conducted virtually. This visit type was conducted due to national recommendations for restrictions regarding the COVID-19 Pandemic (e.g. social distancing, sheltering in place) in an effort to limit this patient's exposure and mitigate transmission in our community. All issues noted in this document were discussed and addressed.  A physical exam was not performed with this format.   I connected with Whitney Ross on 09/26/18 at 1040 by telephone and verified that I am speaking with the correct person using two identifiers. Whitney Ross is currently located at home and family is currently with them during visit. The provider, Monia Pouch, FNP is located in their office at time of visit.  I discussed the limitations, risks, security and privacy concerns of performing an evaluation and management service by telephone and the availability of in person appointments. I also discussed with the patient that there may be a patient responsible charge related to this service. The patient expressed understanding and agreed to proceed.  Subjective:  Patient ID: Whitney Ross, female    DOB: 22-Dec-1955, 63 y.o.   MRN: [id number]  Chief Complaint:  Sinus Problem   HPI: Whitney Ross is a 63 y.o. female presenting on 09/26/2018 for Sinus Problem   Pt reports 4 days of maxillary sinus pressure. States the pressure is under and behind her eyes. States she has nasal congestion, scratchy throat, postnasal drainage, and ear fullness. Pt states she has not taken anything for the symptoms. States the headache is worse with bending over. Chills and fatigue. Has not measured her temperature.   Sinus Problem This is a new problem. The current episode started in the past 7 days. The problem has been gradually worsening since onset. Her pain is at a severity of 6/10. The pain is moderate. Associated symptoms include chills, congestion,  coughing, ear pain, headaches, sinus pressure, sneezing, a sore throat and swollen glands. Pertinent negatives include no diaphoresis, hoarse voice, neck pain or shortness of breath. Past treatments include nothing.     Relevant past medical, surgical, family, and social history reviewed and updated as indicated.  Allergies and medications reviewed and updated.   Past Medical History:  Diagnosis Date   Chronic kidney disease    hx muscle stones   Elevated cholesterol    Hypertension    Lumbago    Neck pain    Nephrolithiasis     Past Surgical History:  Procedure Laterality Date   CESAREAN SECTION  1987   CYSTOSCOPY     VAGINAL HYSTERECTOMY  1997   TOTAL VAGINAL    Social History   Socioeconomic History   Marital status: Married    Spouse name: Not on file   Number of children: Not on file   Years of education: Not on file   Highest education level: Not on file  Occupational History   Not on file  Social Needs   Financial resource strain: Not on file   Food insecurity    Worry: Not on file    Inability: Not on file   Transportation needs    Medical: Not on file    Non-medical: Not on file  Tobacco Use   Smoking status: Current Every Day Smoker    Packs/day: 1.00   Smokeless tobacco: Never Used  Substance and Sexual Activity   Alcohol use: No    Comment: RARELY   Drug use: No   Sexual activity: Not Currently  Lifestyle  Physical activity    Days per week: Not on file    Minutes per session: Not on file   Stress: Not on file  Relationships   Social connections    Talks on phone: Not on file    Gets together: Not on file    Attends religious service: Not on file    Active member of club or organization: Not on file    Attends meetings of clubs or organizations: Not on file    Relationship status: Not on file   Intimate partner violence    Fear of current or ex partner: Not on file    Emotionally abused: Not on file     Physically abused: Not on file    Forced sexual activity: Not on file  Other Topics Concern   Not on file  Social History Narrative   Not on file    Outpatient Encounter Medications as of 09/26/2018  Medication Sig   albuterol (VENTOLIN HFA) 108 (90 Base) MCG/ACT inhaler Inhale 2 puffs into the lungs every 6 (six) hours as needed for wheezing or shortness of breath.   ALPRAZolam (XANAX) 0.5 MG tablet Take 1 tablet (0.5 mg total) by mouth at bedtime as needed for anxiety.   amoxicillin-clavulanate (AUGMENTIN) 875-125 MG tablet Take 1 tablet by mouth 2 (two) times daily for 10 days.   cetirizine (ZYRTEC) 10 MG tablet Take 1 tablet (10 mg total) by mouth daily.   clobetasol ointment (TEMOVATE) 7.62 % Apply 1 application topically 2 (two) times daily.   diazepam (VALIUM) 5 MG tablet TAKE ONE TABLET BY MOUTH EVERY 12 HOURS AS NEEDED FOR DIZZINESS   fluticasone (FLONASE) 50 MCG/ACT nasal spray Place 2 sprays into both nostrils daily.   guaiFENesin (MUCINEX) 600 MG 12 hr tablet Take 1 tablet (600 mg total) by mouth 2 (two) times daily for 10 days.   ibuprofen (ADVIL) 800 MG tablet Take 1 tablet (800 mg total) by mouth every 8 (eight) hours as needed for moderate pain.   lisinopril-hydrochlorothiazide (ZESTORETIC) 20-12.5 MG tablet Take 1 tablet by mouth daily.   loratadine (CLARITIN) 10 MG tablet Take 1 tablet (10 mg total) by mouth daily.   pantoprazole (PROTONIX) 40 MG tablet Take 1 tablet (40 mg total) by mouth daily.   pravastatin (PRAVACHOL) 20 MG tablet Take 1 tablet (20 mg total) by mouth daily. (Needs to be seen before next refill)   predniSONE (DELTASONE) 20 MG tablet 2 po at sametime daily for 5 days   [DISCONTINUED] fluticasone (FLONASE) 50 MCG/ACT nasal spray Place 2 sprays into both nostrils daily.   No facility-administered encounter medications on file as of 09/26/2018.     Allergies  Allergen Reactions   Cortisporin [Bacitra-Neomycin-Polymyxin-Hc]     Review  of Systems  Constitutional: Positive for chills and fatigue. Negative for activity change, appetite change, diaphoresis, fever and unexpected weight change.  HENT: Positive for congestion, ear pain, postnasal drip, sinus pressure, sinus pain, sneezing and sore throat. Negative for ear discharge, facial swelling, hearing loss, hoarse voice, mouth sores, nosebleeds, tinnitus, trouble swallowing and voice change.   Eyes: Negative for photophobia and visual disturbance.  Respiratory: Positive for cough. Negative for chest tightness and shortness of breath.   Cardiovascular: Negative for chest pain and palpitations.  Gastrointestinal: Negative for abdominal pain, diarrhea, rectal pain and vomiting.  Musculoskeletal: Negative for arthralgias, myalgias and neck pain.  Neurological: Positive for headaches. Negative for dizziness, weakness and light-headedness.  Psychiatric/Behavioral: Negative for confusion.  All other systems  reviewed and are negative.        Observations/Objective: No vital signs or physical exam, this was a telephone or virtual health encounter.  Pt alert and oriented, answers all questions appropriately, and able to speak in full sentences.    Assessment and Plan: Whitney Ross was seen today for sinus problem.  Diagnoses and all orders for this visit:  Acute non-recurrent maxillary sinusitis Reported symptoms consistent with acute maxillary sinusitis. No reported fever or symptoms concerning for acute bacterial infection. Pt has not tried symptomatic care. Symptomatic care discussed in detail. Frequent saline nasal sprays, increase water intake, tylenol for pain control, Flonase daily. Mucinex and prednisone as prescribed.  -     fluticasone (FLONASE) 50 MCG/ACT nasal spray; Place 2 sprays into both nostrils daily. -     guaiFENesin (MUCINEX) 600 MG 12 hr tablet; Take 1 tablet (600 mg total) by mouth 2 (two) times daily for 10 days. -     predniSONE (DELTASONE) 20 MG tablet; 2 po  at sametime daily for 5 days  Pt to try symptomatic care for the next 4-5 days. If symptomatic care is not beneficial and symptoms do not resolve or worsen, pt is to initiate Augmentin as prescribed.  -     amoxicillin-clavulanate (AUGMENTIN) 875-125 MG tablet; Take 1 tablet by mouth 2 (two) times daily for 10 days.     Follow Up Instructions: Return if symptoms worsen or fail to improve.    I discussed the assessment and treatment plan with the patient. The patient was provided an opportunity to ask questions and all were answered. The patient agreed with the plan and demonstrated an understanding of the instructions.   The patient was advised to call back or seek an in-person evaluation if the symptoms worsen or if the condition fails to improve as anticipated.  The above assessment and management plan was discussed with the patient. The patient verbalized understanding of and has agreed to the management plan. Patient is aware to call the clinic if symptoms persist or worsen. Patient is aware when to return to the clinic for a follow-up visit. Patient educated on when it is appropriate to go to the emergency department.    I provided 15 minutes of non-face-to-face time during this encounter. The call started at 1040. The call ended at 1055. The other time was used for coordination of care.    Monia Pouch, FNP-C [facility] [address] [place], [place] [address] [phone] 09/26/18

## 2018-10-02 ENCOUNTER — Other Ambulatory Visit: Payer: Self-pay | Admitting: Physician Assistant

## 2018-10-02 DIAGNOSIS — T753XXD Motion sickness, subsequent encounter: Secondary | ICD-10-CM

## 2018-10-04 ENCOUNTER — Ambulatory Visit: Payer: BC Managed Care – PPO | Admitting: Family Medicine

## 2018-10-31 ENCOUNTER — Other Ambulatory Visit: Payer: Self-pay | Admitting: Physician Assistant

## 2018-11-06 ENCOUNTER — Ambulatory Visit (INDEPENDENT_AMBULATORY_CARE_PROVIDER_SITE_OTHER): Payer: BC Managed Care – PPO | Admitting: Physician Assistant

## 2018-11-06 DIAGNOSIS — J0100 Acute maxillary sinusitis, unspecified: Secondary | ICD-10-CM

## 2018-11-06 MED ORDER — AMOXICILLIN-POT CLAVULANATE 875-125 MG PO TABS: 1.0000 | ORAL_TABLET | Freq: Two times a day (BID) | ORAL | 0 refills | Status: DC

## 2018-11-08 ENCOUNTER — Encounter: Payer: Self-pay | Admitting: Physician Assistant

## 2018-11-08 NOTE — Progress Notes (Signed)
Telephone visit  Subjective: [id number] infection PCP: Terald Sleeper, PA-C [id number] L [place] is a 63 y.o. female calls for telephone consult today. Patient provides verbal consent for consult held via phone.  Patient is identified with 2 separate identifiers.  At this time the entire area is on COVID-19 social distancing and stay home orders are in place.  Patient is of higher risk and therefore we are performing this by a virtual method.  Location of patient: home Location of provider: [facility] Others present for call: no  Terald Sleeper PA-C [place], [place] [address] [phone]     ROS: Per HPI  Allergies  Allergen Reactions  . Cortisporin [Bacitra-Neomycin-Polymyxin-Hc]    Past Medical History:  Diagnosis Date  . Chronic kidney disease    hx muscle stones  . Elevated cholesterol   . Hypertension   . Lumbago   . Neck pain   . Nephrolithiasis     Current Outpatient Medications:  .  albuterol (VENTOLIN HFA) 108 (90 Base) MCG/ACT inhaler, Inhale 2 puffs into the lungs every 6 (six) hours as needed for wheezing or shortness of breath., Disp: 8.5 g, Rfl: 0 .  ALPRAZolam (XANAX) 0.5 MG tablet, Take 1 tablet (0.5 mg total) by mouth at bedtime as needed for anxiety., Disp: 90 tablet, Rfl: 5 .  amoxicillin-clavulanate (AUGMENTIN) 875-125 MG tablet, Take 1 tablet by mouth 2 (two) times daily., Disp: 20 tablet, Rfl: 0 .  cetirizine (ZYRTEC) 10 MG tablet, Take 1 tablet (10 mg total) by mouth daily., Disp: 30 tablet, Rfl: 11 .  clobetasol ointment (TEMOVATE) [id number] %, Apply 1 application topically 2 (two) times daily., Disp: 30 g, Rfl: 0 .  diazepam (VALIUM) 5 MG tablet, TAKE ONE TABLET BY MOUTH EVERY 12 HOURS AS NEEDED FOR DIZZINESS, Disp: 30 tablet, Rfl: 1 .  fluticasone (FLONASE) 50 MCG/ACT nasal spray, Place 2 sprays into both nostrils daily., Disp: 16 g, Rfl: 6 .  ibuprofen (ADVIL) 800 MG tablet, Take 1 tablet (800 mg  total) by mouth every 8 (eight) hours as needed for moderate pain., Disp: 60 tablet, Rfl: 2 .  lisinopril-hydrochlorothiazide (ZESTORETIC) 20-12.5 MG tablet, Take 1 tablet by mouth daily., Disp: 30 tablet, Rfl: 5 .  loratadine (CLARITIN) 10 MG tablet, Take 1 tablet (10 mg total) by mouth daily., Disp: 90 tablet, Rfl: 3 .  pantoprazole (PROTONIX) 40 MG tablet, Take 1 tablet (40 mg total) by mouth daily., Disp: 30 tablet, Rfl: 5 .  pravastatin (PRAVACHOL) 20 MG tablet, Take 1 tablet (20 mg total) by mouth daily. (Needs to be seen before next refill), Disp: 30 tablet, Rfl: 5 .  predniSONE (DELTASONE) 20 MG tablet, 2 po at sametime daily for 5 days, Disp: 10 tablet, Rfl: 0  Assessment/ Plan: 63 y.o. female   1. Acute non-recurrent maxillary sinusitis - amoxicillin-clavulanate (AUGMENTIN) 875-125 MG tablet; Take 1 tablet by mouth 2 (two) times daily.  Dispense: 20 tablet; Refill: 0   No follow-ups on file.  Continue all other maintenance medications as listed above.  Start time: 5:03 PM End time: 5:10 PM  Meds ordered this encounter  Medications  . amoxicillin-clavulanate (AUGMENTIN) 875-125 MG tablet    Sig: Take 1 tablet by mouth 2 (two) times daily.    Dispense:  20 tablet    Refill:  0    Order Specific Question:   Supervising Provider    Answer:   Janora Norlander [id number]  [name] PA-C [place] [phone]

## 2018-11-27 ENCOUNTER — Other Ambulatory Visit: Payer: Self-pay | Admitting: Physician Assistant

## 2018-11-27 DIAGNOSIS — T753XXD Motion sickness, subsequent encounter: Secondary | ICD-10-CM

## 2018-12-06 ENCOUNTER — Encounter: Payer: Self-pay | Admitting: Physician Assistant

## 2018-12-06 ENCOUNTER — Ambulatory Visit (INDEPENDENT_AMBULATORY_CARE_PROVIDER_SITE_OTHER): Payer: BC Managed Care – PPO | Admitting: Physician Assistant

## 2018-12-06 DIAGNOSIS — I10 Essential (primary) hypertension: Secondary | ICD-10-CM

## 2018-12-06 DIAGNOSIS — F419 Anxiety disorder, unspecified: Secondary | ICD-10-CM | POA: Diagnosis not present

## 2018-12-06 DIAGNOSIS — E7800 Pure hypercholesterolemia, unspecified: Secondary | ICD-10-CM | POA: Diagnosis not present

## 2018-12-06 DIAGNOSIS — J0100 Acute maxillary sinusitis, unspecified: Secondary | ICD-10-CM | POA: Diagnosis not present

## 2018-12-06 DIAGNOSIS — T753XXD Motion sickness, subsequent encounter: Secondary | ICD-10-CM

## 2018-12-06 DIAGNOSIS — K219 Gastro-esophageal reflux disease without esophagitis: Secondary | ICD-10-CM

## 2018-12-06 MED ORDER — AMOXICILLIN-POT CLAVULANATE 875-125 MG PO TABS: 1.0000 | ORAL_TABLET | Freq: Two times a day (BID) | ORAL | 0 refills | Status: DC

## 2018-12-06 MED ORDER — DIAZEPAM 5 MG PO TABS: ORAL_TABLET | ORAL | 1 refills | Status: DC

## 2018-12-06 MED ORDER — PANTOPRAZOLE SODIUM 40 MG PO TBEC: 40.0000 mg | DELAYED_RELEASE_TABLET | Freq: Every day | ORAL | 5 refills | Status: DC

## 2018-12-06 MED ORDER — LISINOPRIL-HYDROCHLOROTHIAZIDE 20-12.5 MG PO TABS: 1.0000 | ORAL_TABLET | Freq: Every day | ORAL | 5 refills | Status: DC

## 2018-12-06 MED ORDER — PRAVASTATIN SODIUM 20 MG PO TABS: 20.0000 mg | ORAL_TABLET | Freq: Every day | ORAL | 5 refills | Status: DC

## 2018-12-06 MED ORDER — ALPRAZOLAM 0.5 MG PO TABS: 0.5000 mg | ORAL_TABLET | Freq: Every evening | ORAL | 5 refills | Status: DC | PRN

## 2018-12-06 NOTE — Progress Notes (Signed)
855      Telephone visit  Subjective: CC: Recheck on conditions and medications PCP: Whitney Sleeper, PA-C [id number] Whitney Ross is a 63 y.o. female calls for telephone consult today. Patient provides verbal consent for consult held via phone.  Patient is identified with 2 separate identifiers.  At this time the entire area is on COVID-19 social distancing and stay home orders are in place.  Patient is of higher risk and therefore we are performing this by a virtual method.  Location of patient: Home Location of provider: HOME Others present for call: no  This patient is having.  Recheck on her chronic medical conditions which do include hypertension, GERD, hyperlipidemia, anxiety, motion sickness and in her ear problems.  She has been recovering from the arthroscopic surgery she had on her knee.  She reports that she is feeling better.  They did do a recent injection just to help calm it down.  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  She does have recurrent sinus infections and chronic allergies.  She does need refills on some of her medication.  She will be going out of town for several weeks.  And usually does get a sinus infection a few times in the fall.  We will go ahead and refill her antibiotic so that she will have it when she travels.   ROS: Per HPI  Allergies  Allergen Reactions  . Cortisporin [Bacitra-Neomycin-Polymyxin-Hc]    Past Medical History:  Diagnosis Date  . Chronic kidney disease    hx muscle stones  . Elevated cholesterol   . Hypertension   . Lumbago   . Neck pain   . Nephrolithiasis     Current Outpatient Medications:  .  albuterol (VENTOLIN HFA) 108 (90 Base) MCG/ACT inhaler, Inhale 2 puffs into the lungs every 6 (six) hours as needed for wheezing or shortness of  breath., Disp: 8.5 g, Rfl: 0 .  ALPRAZolam (XANAX) 0.5 MG tablet, Take 1 tablet (0.5 mg total) by mouth at bedtime as needed for anxiety., Disp: 90 tablet, Rfl: 5 .  amoxicillin-clavulanate (AUGMENTIN) 875-125 MG tablet, Take 1 tablet by mouth 2 (two) times daily., Disp: 20 tablet, Rfl: 0 .  cetirizine (ZYRTEC) 10 MG tablet, Take 1 tablet (10 mg total) by mouth daily., Disp: 30 tablet, Rfl: 11 .  clobetasol ointment (TEMOVATE) AB-123456789 %, Apply 1 application topically 2 (two) times daily., Disp: 30 g, Rfl: 0 .  diazepam (VALIUM) 5 MG tablet, TAKE ONE TABLET BY MOUTH EVERY 12 HOURS AS NEEDED FOR DIZZINESS, Disp: 30 tablet, Rfl: 1 .  fluticasone (FLONASE) 50 MCG/ACT nasal spray, Place 2 sprays into both nostrils daily., Disp: 16 g, Rfl: 6 .  ibuprofen (ADVIL) 800 MG tablet, Take 1 tablet (800 mg total) by mouth every 8 (eight) hours as needed for moderate pain., Disp: 60 tablet, Rfl: 2 .  lisinopril-hydrochlorothiazide (ZESTORETIC) 20-12.5 MG tablet, Take 1 tablet by mouth daily., Disp: 30 tablet, Rfl: 5 .  pantoprazole (PROTONIX) 40 MG tablet, Take 1 tablet (40 mg total) by mouth daily., Disp: 30 tablet, Rfl: 5 .  pravastatin (PRAVACHOL) 20 MG tablet, Take 1 tablet (20 mg total) by mouth daily. (Needs to be seen before next refill), Disp: 30 tablet, Rfl: 5 .  predniSONE (DELTASONE)  20 MG tablet, 2 po at sametime daily for 5 days, Disp: 10 tablet, Rfl: 0  Assessment/ Plan: 63 y.o. female   1. Anxiety - ALPRAZolam (XANAX) 0.5 MG tablet; Take 1 tablet (0.5 mg total) by mouth at bedtime as needed for anxiety.  Dispense: 90 tablet; Refill: 5  2. Motion sickness, subsequent encounter - diazepam (VALIUM) 5 MG tablet; TAKE ONE TABLET BY MOUTH EVERY 12 HOURS AS NEEDED FOR DIZZINESS  Dispense: 30 tablet; Refill: 1  3. Acute non-recurrent maxillary sinusitis - amoxicillin-clavulanate (AUGMENTIN) 875-125 MG tablet; Take 1 tablet by mouth 2 (two) times daily.  Dispense: 20 tablet; Refill: 0  4. Essential  hypertension - lisinopril-hydrochlorothiazide (ZESTORETIC) 20-12.5 MG tablet; Take 1 tablet by mouth daily.  Dispense: 30 tablet; Refill: 5  5. Gastroesophageal reflux disease without esophagitis - pantoprazole (PROTONIX) 40 MG tablet; Take 1 tablet (40 mg total) by mouth daily.  Dispense: 30 tablet; Refill: 5  6. Pure hypercholesterolemia - pravastatin (PRAVACHOL) 20 MG tablet; Take 1 tablet (20 mg total) by mouth daily. (Needs to be seen before next refill)  Dispense: 30 tablet; Refill: 5   No follow-ups on file.  Continue all other maintenance medications as listed above.  Start time: 8:55 AM End time: 9:06 AM  Meds ordered this encounter  Medications  . ALPRAZolam (XANAX) 0.5 MG tablet    Sig: Take 1 tablet (0.5 mg total) by mouth at bedtime as needed for anxiety.    Dispense:  90 tablet    Refill:  5    Order Specific Question:   Supervising Provider    Answer:   Whitney Ross [id number]  . diazepam (VALIUM) 5 MG tablet    Sig: TAKE ONE TABLET BY MOUTH EVERY 12 HOURS AS NEEDED FOR DIZZINESS    Dispense:  30 tablet    Refill:  1    Order Specific Question:   Supervising Provider    Answer:   Whitney Ross [id number]  . amoxicillin-clavulanate (AUGMENTIN) 875-125 MG tablet    Sig: Take 1 tablet by mouth 2 (two) times daily.    Dispense:  20 tablet    Refill:  0    Order Specific Question:   Supervising Provider    Answer:   Whitney Ross [id number]  . lisinopril-hydrochlorothiazide (ZESTORETIC) 20-12.5 MG tablet    Sig: Take 1 tablet by mouth daily.    Dispense:  30 tablet    Refill:  5    Order Specific Question:   Supervising Provider    Answer:   Whitney Ross [id number]  . pantoprazole (PROTONIX) 40 MG tablet    Sig: Take 1 tablet (40 mg total) by mouth daily.    Dispense:  30 tablet    Refill:  5    Order Specific Question:   Supervising Provider    Answer:   Whitney Ross [id number]  . pravastatin (PRAVACHOL) 20 MG tablet    Sig:  Take 1 tablet (20 mg total) by mouth daily. (Needs to be seen before next refill)    Dispense:  30 tablet    Refill:  5    Order Specific Question:   Supervising Provider    Answer:   Whitney Ross [id number]    Whitney Nearing PA-C [place] [phone]

## 2018-12-20 ENCOUNTER — Other Ambulatory Visit: Payer: Self-pay | Admitting: Physician Assistant

## 2019-01-16 ENCOUNTER — Other Ambulatory Visit: Payer: Self-pay | Admitting: Physician Assistant

## 2019-01-16 DIAGNOSIS — T753XXD Motion sickness, subsequent encounter: Secondary | ICD-10-CM

## 2019-02-10 ENCOUNTER — Other Ambulatory Visit: Payer: Self-pay | Admitting: Physician Assistant

## 2019-03-12 ENCOUNTER — Other Ambulatory Visit: Payer: Self-pay | Admitting: Physician Assistant

## 2019-03-12 DIAGNOSIS — T753XXD Motion sickness, subsequent encounter: Secondary | ICD-10-CM

## 2019-03-13 NOTE — Telephone Encounter (Signed)
Need to only have one benzodiazepine as current therapy

## 2019-04-06 ENCOUNTER — Other Ambulatory Visit: Payer: Self-pay | Admitting: Family Medicine

## 2019-04-06 DIAGNOSIS — J0100 Acute maxillary sinusitis, unspecified: Secondary | ICD-10-CM

## 2019-04-09 ENCOUNTER — Other Ambulatory Visit: Payer: Self-pay | Admitting: Physician Assistant

## 2019-04-09 DIAGNOSIS — T753XXD Motion sickness, subsequent encounter: Secondary | ICD-10-CM

## 2019-04-10 ENCOUNTER — Ambulatory Visit (INDEPENDENT_AMBULATORY_CARE_PROVIDER_SITE_OTHER): Payer: BC Managed Care – PPO | Admitting: Family

## 2019-04-10 ENCOUNTER — Encounter: Payer: Self-pay | Admitting: Family

## 2019-04-10 ENCOUNTER — Other Ambulatory Visit: Payer: Self-pay

## 2019-04-10 DIAGNOSIS — K5792 Diverticulitis of intestine, part unspecified, without perforation or abscess without bleeding: Secondary | ICD-10-CM | POA: Diagnosis not present

## 2019-04-10 MED ORDER — METRONIDAZOLE 500 MG PO TABS: 500.0000 mg | ORAL_TABLET | Freq: Three times a day (TID) | ORAL | 0 refills | Status: DC

## 2019-04-10 MED ORDER — CIPROFLOXACIN HCL 500 MG PO TABS: 500.0000 mg | ORAL_TABLET | Freq: Two times a day (BID) | ORAL | 0 refills | Status: DC

## 2019-04-10 NOTE — Progress Notes (Signed)
   Virtual Visit via telephone Note Due to COVID-19 pandemic this visit was conducted virtually. This visit type was conducted due to national recommendations for restrictions regarding the COVID-19 Pandemic (e.g. social distancing, sheltering in place) in an effort to limit this patient's exposure and mitigate transmission in our community. All issues noted in this document were discussed and addressed.  A physical exam was not performed with this format.  I connected with Whitney Ross on 04/10/19 at 8:10 AM by telephone and verified that I am speaking with the correct person using two identifiers. Whitney Ross is currently located in car and no one is currently with her during visit. The provider, Evelina Dun, FNP is located in their office at time of visit.  I discussed the limitations, risks, security and privacy concerns of performing an evaluation and management service by telephone and the availability of in person appointments. I also discussed with the patient that there may be a patient responsible charge related to this service. The patient expressed understanding and agreed to proceed.   History and Present Illness:  Abdominal Pain This is a new problem. The current episode started yesterday. The onset quality is gradual. The problem occurs intermittently. The problem has been unchanged. The pain is located in the suprapubic region. The pain is at a severity of 8/10. The pain is moderate. The quality of the pain is burning. The abdominal pain radiates to the LLQ. Associated symptoms include constipation and flatus. Pertinent negatives include no diarrhea, dysuria, fever, frequency, hematuria, nausea or vomiting. Nothing aggravates the pain. The pain is relieved by nothing. Treatments tried: motrin.      Review of Systems  Constitutional: Negative for fever.  Gastrointestinal: Positive for abdominal pain, constipation and flatus. Negative for diarrhea, nausea and vomiting.    Genitourinary: Negative for dysuria, frequency and hematuria.  All other systems reviewed and are negative.    Observations/Objective: No SOB or distress noted  Assessment and Plan: 1. Diverticulitis Rest Full liquid diet If fever develops or abdominal pain worsens go to ED - ciprofloxacin (CIPRO) 500 MG tablet; Take 1 tablet (500 mg total) by mouth 2 (two) times daily.  Dispense: 14 tablet; Refill: 0 - metroNIDAZOLE (FLAGYL) 500 MG tablet; Take 1 tablet (500 mg total) by mouth 3 (three) times daily.  Dispense: 21 tablet; Refill: 0     I discussed the assessment and treatment plan with the patient. The patient was provided an opportunity to ask questions and all were answered. The patient agreed with the plan and demonstrated an understanding of the instructions.   The patient was advised to call back or seek an in-person evaluation if the symptoms worsen or if the condition fails to improve as anticipated.  The above assessment and management plan was discussed with the patient. The patient verbalized understanding of and has agreed to the management plan. Patient is aware to call the clinic if symptoms persist or worsen. Patient is aware when to return to the clinic for a follow-up visit. Patient educated on when it is appropriate to go to the emergency department.   Time call ended:  8:23 Am  I provided 13 minutes of non-face-to-face time during this encounter.    Evelina Dun, FNP

## 2019-04-11 ENCOUNTER — Ambulatory Visit (INDEPENDENT_AMBULATORY_CARE_PROVIDER_SITE_OTHER): Payer: BC Managed Care – PPO | Admitting: Family Medicine

## 2019-04-11 ENCOUNTER — Other Ambulatory Visit: Payer: Self-pay

## 2019-04-11 DIAGNOSIS — J0100 Acute maxillary sinusitis, unspecified: Secondary | ICD-10-CM | POA: Diagnosis not present

## 2019-04-11 MED ORDER — AMOXICILLIN-POT CLAVULANATE 875-125 MG PO TABS: 1.0000 | ORAL_TABLET | Freq: Three times a day (TID) | ORAL | 0 refills | Status: AC

## 2019-04-11 NOTE — Patient Instructions (Signed)

## 2019-04-11 NOTE — Progress Notes (Signed)
Telephone visit  Subjective: CC: sinus infection? PCP: Terald Sleeper, PA-C [id number] Whitney Ross is a 64 y.o. female calls for telephone consult today. Patient provides verbal consent for consult held via phone.  Due to COVID-19 pandemic this visit was conducted virtually. This visit type was conducted due to national recommendations for restrictions regarding the COVID-19 Pandemic (e.g. social distancing, sheltering in place) in an effort to limit this patient's exposure and mitigate transmission in our community. All issues noted in this document were discussed and addressed.  A physical exam was not performed with this format.   Location of patient: home Location of provider: [facility] Others present for call: husband  1. Sinusitis Patient reports onset last evening.  She reports dental pain/ facial pain.  Denies purulence from nares.  She denies cough, congestion.  TMax 30F.  She was prescribed Cipro and Flagyl for diverticulitis but she got sick on her stomach from Cipro.  She is takes Zyrtec. She is not using her Flonase.  She was prescribed prednisone at Urgent care today.  They also did a Covid test for her.  She is out of work for the next couple of days awaiting this result.  Does not report any fevers, nausea or vomiting.  ROS: Per HPI  Allergies  Allergen Reactions  . Cortisporin [Bacitra-Neomycin-Polymyxin-Hc]    Past Medical History:  Diagnosis Date  . Chronic kidney disease    hx muscle stones  . Elevated cholesterol   . Hypertension   . Lumbago   . Neck pain   . Nephrolithiasis     Current Outpatient Medications:  .  albuterol (VENTOLIN HFA) 108 (90 Base) MCG/ACT inhaler, INHALE TWO PUFFS INTO THE LUNGS EVERY 6 HOURS AS NEEDED FOR WHEEZING OR SHORTNESS OF BREATH, Disp: 8.5 g, Rfl: 0 .  ALPRAZolam (XANAX) 0.5 MG tablet, Take 1 tablet (0.5 mg total) by mouth at bedtime as needed for anxiety., Disp: 90 tablet, Rfl: 5 .  amoxicillin-clavulanate (AUGMENTIN) 875-125 MG  tablet, Take 1 tablet by mouth 2 (two) times daily., Disp: 20 tablet, Rfl: 0 .  cetirizine (ZYRTEC) 10 MG tablet, Take 1 tablet (10 mg total) by mouth daily., Disp: 30 tablet, Rfl: 11 .  ciprofloxacin (CIPRO) 500 MG tablet, Take 1 tablet (500 mg total) by mouth 2 (two) times daily., Disp: 14 tablet, Rfl: 0 .  clobetasol ointment (TEMOVATE) AB-123456789 %, Apply 1 application topically 2 (two) times daily., Disp: 30 g, Rfl: 0 .  diazepam (VALIUM) 5 MG tablet, TAKE ONE TABLET BY MOUTH EVERY 12 HOURS AS NEEDED FOR DIZZINESS, Disp: 30 tablet, Rfl: 1 .  fluticasone (FLONASE) 50 MCG/ACT nasal spray, Place 2 sprays into both nostrils daily., Disp: 16 g, Rfl: 6 .  ibuprofen (ADVIL) 800 MG tablet, Take 1 tablet (800 mg total) by mouth every 8 (eight) hours as needed for moderate pain., Disp: 60 tablet, Rfl: 2 .  lisinopril-hydrochlorothiazide (ZESTORETIC) 20-12.5 MG tablet, Take 1 tablet by mouth daily., Disp: 30 tablet, Rfl: 5 .  metroNIDAZOLE (FLAGYL) 500 MG tablet, Take 1 tablet (500 mg total) by mouth 3 (three) times daily., Disp: 21 tablet, Rfl: 0 .  pantoprazole (PROTONIX) 40 MG tablet, Take 1 tablet (40 mg total) by mouth daily., Disp: 30 tablet, Rfl: 5 .  pravastatin (PRAVACHOL) 20 MG tablet, Take 1 tablet (20 mg total) by mouth daily. (Needs to be seen before next refill), Disp: 30 tablet, Rfl: 5 .  predniSONE (DELTASONE) 20 MG tablet, 2 po at sametime daily for 5 days, Disp:  10 tablet, Rfl: 0  Assessment/ Plan: 64 y.o. female   1. Acute non-recurrent maxillary sinusitis Will empirically treat for presumed bacterial sinusitis.  Because she is also being treated for diverticulitis I have gone ahead and placed her on Augmentin 3 times daily for 7 days.  She can discontinue the Cipro.  Okay to continue the Flagyl for double coverage.  Use Zyrtec.  Use Flonase.  Hydration encouraged.  We discussed red flag signs and symptoms.  She will follow-up as needed. - amoxicillin-clavulanate (AUGMENTIN) 875-125 MG  tablet; Take 1 tablet by mouth in the morning, at noon, and at bedtime for 7 days.  Dispense: 21 tablet; Refill: 0   Start time: 2:14pm End time: 2:20pm  Total time spent on patient care (including telephone call/ virtual visit): 15 minutes  [place], [place] [phone]

## 2019-04-19 ENCOUNTER — Ambulatory Visit (INDEPENDENT_AMBULATORY_CARE_PROVIDER_SITE_OTHER): Payer: BC Managed Care – PPO | Admitting: Physician Assistant

## 2019-04-19 DIAGNOSIS — K5792 Diverticulitis of intestine, part unspecified, without perforation or abscess without bleeding: Secondary | ICD-10-CM | POA: Diagnosis not present

## 2019-04-19 MED ORDER — FLUCONAZOLE 150 MG PO TABS: ORAL_TABLET | ORAL | 0 refills | Status: DC

## 2019-04-19 MED ORDER — AMOXICILLIN-POT CLAVULANATE 875-125 MG PO TABS: 1.0000 | ORAL_TABLET | Freq: Three times a day (TID) | ORAL | 0 refills | Status: DC

## 2019-04-19 NOTE — Progress Notes (Signed)
Telephone visit  Subjective: CC: Diverticulitis PCP: Terald Sleeper, PA-C [id number] Whitney Ross is a 64 y.o. female calls for telephone consult today. Patient provides verbal consent for consult held via phone.  Patient is identified with 2 separate identifiers.  At this time the entire area is on COVID-19 social distancing and stay home orders are in place.  Patient is of higher risk and therefore we are performing this by a virtual method.  Location of patient: Home Location of provider: [facility] Others present for call: No   This patient is having a telephone visit for follow-up on her abdominal pain.  She has had a history of diverticulitis and diverticulosis seen on for last colonoscopy.  Few weeks ago she started with significant stomach and gas pain and loose stools.  She denies seeing any blood or mucus.  She was started on Flagyl and Cipro but could not tolerate it.  So she was switched over to Augmentin.  She started on 04/11/2019.  She has nearly completed up but is not thought completely better.  She did go to the urgent care because she was having some sinus issues.  She did have a negative Covid test at that time.  She would like to go ahead and be treated for diverticulitis.    ROS: Per HPI  Allergies  Allergen Reactions  . Cortisporin [Bacitra-Neomycin-Polymyxin-Hc]    Past Medical History:  Diagnosis Date  . Chronic kidney disease    hx muscle stones  . Elevated cholesterol   . Hypertension   . Lumbago   . Neck pain   . Nephrolithiasis     Current Outpatient Medications:  .  albuterol (VENTOLIN HFA) 108 (90 Base) MCG/ACT inhaler, INHALE TWO PUFFS INTO THE LUNGS EVERY 6 HOURS AS NEEDED FOR WHEEZING OR SHORTNESS OF BREATH, Disp: 8.5 g, Rfl: 0 .  ALPRAZolam (XANAX) 0.5 MG tablet, Take 1 tablet (0.5 mg total) by mouth at bedtime as needed for anxiety., Disp: 90 tablet, Rfl: 5 .  amoxicillin-clavulanate (AUGMENTIN) 875-125 MG tablet, Take 1 tablet by mouth 3  (three) times daily., Disp: 30 tablet, Rfl: 0 .  cetirizine (ZYRTEC) 10 MG tablet, Take 1 tablet (10 mg total) by mouth daily., Disp: 30 tablet, Rfl: 11 .  ciprofloxacin (CIPRO) 500 MG tablet, Take 1 tablet (500 mg total) by mouth 2 (two) times daily., Disp: 14 tablet, Rfl: 0 .  clobetasol ointment (TEMOVATE) AB-123456789 %, Apply 1 application topically 2 (two) times daily., Disp: 30 g, Rfl: 0 .  diazepam (VALIUM) 5 MG tablet, TAKE ONE TABLET BY MOUTH EVERY 12 HOURS AS NEEDED FOR DIZZINESS, Disp: 30 tablet, Rfl: 1 .  fluconazole (DIFLUCAN) 150 MG tablet, 1 po q week x 4 weeks, Disp: 4 tablet, Rfl: 0 .  fluticasone (FLONASE) 50 MCG/ACT nasal spray, Place 2 sprays into both nostrils daily., Disp: 16 g, Rfl: 6 .  ibuprofen (ADVIL) 800 MG tablet, Take 1 tablet (800 mg total) by mouth every 8 (eight) hours as needed for moderate pain., Disp: 60 tablet, Rfl: 2 .  lisinopril-hydrochlorothiazide (ZESTORETIC) 20-12.5 MG tablet, Take 1 tablet by mouth daily., Disp: 30 tablet, Rfl: 5 .  pantoprazole (PROTONIX) 40 MG tablet, Take 1 tablet (40 mg total) by mouth daily., Disp: 30 tablet, Rfl: 5 .  pravastatin (PRAVACHOL) 20 MG tablet, Take 1 tablet (20 mg total) by mouth daily. (Needs to be seen before next refill), Disp: 30 tablet, Rfl: 5 .  predniSONE (DELTASONE) 20 MG tablet, 2 po  at sametime daily for 5 days, Disp: 10 tablet, Rfl: 0  Assessment/ Plan: 64 y.o. female   1. Diverticulitis - amoxicillin-clavulanate (AUGMENTIN) 875-125 MG tablet; Take 1 tablet by mouth 3 (three) times daily.  Dispense: 30 tablet; Refill: 0 - fluconazole (DIFLUCAN) 150 MG tablet; 1 po q week x 4 weeks  Dispense: 4 tablet; Refill: 0   No follow-ups on file.  Continue all other maintenance medications as listed above.  Start time: 8:15 AM End time: 8:28 AM  Meds ordered this encounter  Medications  . amoxicillin-clavulanate (AUGMENTIN) 875-125 MG tablet    Sig: Take 1 tablet by mouth 3 (three) times daily.    Dispense:  30  tablet    Refill:  0    Order Specific Question:   Supervising Provider    Answer:   Janora Norlander [id number]  . fluconazole (DIFLUCAN) 150 MG tablet    Sig: 1 po q week x 4 weeks    Dispense:  4 tablet    Refill:  0    Order Specific Question:   Supervising Provider    Answer:   Janora Norlander [id number]    [name] PA-C [place] [phone]

## 2019-04-22 ENCOUNTER — Encounter: Payer: Self-pay | Admitting: Physician Assistant

## 2019-04-27 ENCOUNTER — Telehealth: Payer: Self-pay | Admitting: Physician Assistant

## 2019-04-27 NOTE — Telephone Encounter (Addendum)
Pt was seen at [facility] in [place] 04/24/19 and had CT scan which showed diverticulitis per pt. Pt was already being treated with Augmentin for this so no new medications were prescribed. She was advised to complete antibiotics and f/u with PCP. Scheduled with AJ 05/09/19 at 11:25. Records requested from [facility].

## 2019-05-05 ENCOUNTER — Other Ambulatory Visit: Payer: Self-pay | Admitting: Physician Assistant

## 2019-05-05 DIAGNOSIS — T753XXD Motion sickness, subsequent encounter: Secondary | ICD-10-CM

## 2019-05-09 ENCOUNTER — Other Ambulatory Visit: Payer: Self-pay

## 2019-05-09 ENCOUNTER — Encounter: Payer: Self-pay | Admitting: Physician Assistant

## 2019-05-09 ENCOUNTER — Ambulatory Visit (INDEPENDENT_AMBULATORY_CARE_PROVIDER_SITE_OTHER): Payer: BC Managed Care – PPO | Admitting: Physician Assistant

## 2019-05-09 VITALS — BP 107/66 | HR 64 | Temp 98.6°F | Ht 65.0 in | Wt 190.6 lb

## 2019-05-09 DIAGNOSIS — R0683 Snoring: Secondary | ICD-10-CM

## 2019-05-09 DIAGNOSIS — T753XXD Motion sickness, subsequent encounter: Secondary | ICD-10-CM | POA: Diagnosis not present

## 2019-05-09 DIAGNOSIS — E7800 Pure hypercholesterolemia, unspecified: Secondary | ICD-10-CM

## 2019-05-09 DIAGNOSIS — K219 Gastro-esophageal reflux disease without esophagitis: Secondary | ICD-10-CM

## 2019-05-09 DIAGNOSIS — F419 Anxiety disorder, unspecified: Secondary | ICD-10-CM | POA: Diagnosis not present

## 2019-05-09 DIAGNOSIS — K5792 Diverticulitis of intestine, part unspecified, without perforation or abscess without bleeding: Secondary | ICD-10-CM

## 2019-05-09 DIAGNOSIS — I10 Essential (primary) hypertension: Secondary | ICD-10-CM

## 2019-05-09 MED ORDER — PANTOPRAZOLE SODIUM 40 MG PO TBEC: 40.0000 mg | DELAYED_RELEASE_TABLET | Freq: Every day | ORAL | 5 refills | Status: DC

## 2019-05-09 MED ORDER — PRAVASTATIN SODIUM 20 MG PO TABS: 20.0000 mg | ORAL_TABLET | Freq: Every day | ORAL | 5 refills | Status: DC

## 2019-05-09 MED ORDER — DIAZEPAM 5 MG PO TABS: 5.0000 mg | ORAL_TABLET | Freq: Two times a day (BID) | ORAL | 5 refills | Status: DC | PRN

## 2019-05-09 MED ORDER — LISINOPRIL-HYDROCHLOROTHIAZIDE 20-12.5 MG PO TABS: 1.0000 | ORAL_TABLET | Freq: Every day | ORAL | 5 refills | Status: DC

## 2019-05-10 LAB — CMP14+EGFR
ALT: 18 IU/L (ref 0–32)
AST: 19 IU/L (ref 0–40)
Albumin/Globulin Ratio: 1.5 (ref 1.2–2.2)
Albumin: 4 g/dL (ref 3.8–4.8)
Alkaline Phosphatase: 58 IU/L (ref 39–117)
BUN/Creatinine Ratio: 15 (ref 12–28)
BUN: 11 mg/dL (ref 8–27)
Bilirubin Total: 0.3 mg/dL (ref 0.0–1.2)
CO2: 24 mmol/L (ref 20–29)
Calcium: 9.4 mg/dL (ref 8.7–10.3)
Chloride: 101 mmol/L (ref 96–106)
Creatinine, Ser: 0.72 mg/dL (ref 0.57–1.00)
GFR calc Af Amer: 105 mL/min/{1.73_m2} (ref >59)
GFR calc non Af Amer: 91 mL/min/{1.73_m2} (ref >59)
Globulin, Total: 2.6 g/dL (ref 1.5–4.5)
Glucose: 92 mg/dL (ref 65–99)
Potassium: 3.8 mmol/L (ref 3.5–5.2)
Sodium: 143 mmol/L (ref 134–144)
Total Protein: 6.6 g/dL (ref 6.0–8.5)

## 2019-05-10 LAB — CBC WITH DIFFERENTIAL/PLATELET
Basophils Absolute: 0.1 10*3/uL (ref 0.0–0.2)
Basos: 1 %
EOS (ABSOLUTE): 0.1 10*3/uL (ref 0.0–0.4)
Eos: 2 %
Hematocrit: 37.2 % (ref 34.0–46.6)
Hemoglobin: 12.4 g/dL (ref 11.1–15.9)
Immature Grans (Abs): 0 10*3/uL (ref 0.0–0.1)
Immature Granulocytes: 0 %
Lymphocytes Absolute: 2.6 10*3/uL (ref 0.7–3.1)
Lymphs: 38 %
MCH: 30.3 pg (ref 26.6–33.0)
MCHC: 33.3 g/dL (ref 31.5–35.7)
MCV: 91 fL (ref 79–97)
Monocytes Absolute: 0.5 10*3/uL (ref 0.1–0.9)
Monocytes: 8 %
Neutrophils Absolute: 3.5 10*3/uL (ref 1.4–7.0)
Neutrophils: 51 %
Platelets: 305 10*3/uL (ref 150–450)
RBC: 4.09 x10E6/uL (ref 3.77–5.28)
RDW: 12.3 % (ref 11.7–15.4)
WBC: 6.8 10*3/uL (ref 3.4–10.8)

## 2019-05-13 LAB — TOXASSURE SELECT 13 (MW), URINE

## 2019-05-17 ENCOUNTER — Encounter: Payer: Self-pay | Admitting: Physician Assistant

## 2019-05-17 ENCOUNTER — Telehealth: Payer: Self-pay | Admitting: Physician Assistant

## 2019-05-17 NOTE — Telephone Encounter (Signed)
This may have been 1 of [facility] open charts but as of right now it looks like all of [facility] charts are closed, I just checked.  So this should be able to go through now.

## 2019-05-17 NOTE — Telephone Encounter (Signed)
Pt called to get an update on referral status for colonoscopy and sleep study. Said she recently saw [facility] for hospital f/u and was told by Glenard Haring that she would send referrals for pt to have both done.

## 2019-05-17 NOTE — Progress Notes (Signed)
BP 107/66   Pulse 64   Temp 98.6 F (37 C)   Ht _0  (1.651 m)   Wt 190 lb 9.6 oz (86.5 kg)   SpO2 99%   BMI 31.72 kg/m    Subjective:    Patient ID: Whitney Ross, female    DOB: 11/07/1955, 64 y.o.   MRN: [id number]  Gastroesophageal Reflux She complains of abdominal pain and nausea. She reports no chest pain, no coughing, no heartburn or no hoarse voice. This is a recurrent problem. The current episode started 1 to 4 weeks ago. The problem has been waxing and waning. The symptoms are aggravated by certain foods. Pertinent negatives include no fatigue.  Abdominal Cramping This is a recurrent problem. The current episode started 1 to 4 weeks ago. The onset quality is sudden. The problem has been waxing and waning. The pain is located in the left flank and LLQ. The pain is at a severity of 5/10. Associated symptoms include diarrhea, frequency and nausea. Pertinent negatives include no dysuria or fever. The pain is aggravated by eating. She has tried antibiotics for the symptoms. The treatment provided mild relief. Prior diagnostic workup includes GI consult. Her past medical history is significant for GERD.  Hypertension Associated symptoms include anxiety. Pertinent negatives include no chest pain. There are no associated agents to hypertension. Past treatments include ACE inhibitors.  Anxiety Presents for follow-up visit. Symptoms include excessive worry, irritability, nausea and nervous/anxious behavior. Patient reports no chest pain. Symptoms occur most days. The severity of symptoms is moderate.    ANXIETY ASSESSMENT Cause of anxiety: GAD This patient returns for a  month recheck on narcotic use for the above named condition(s)  Current medications- diazepam Other medications tried: SSRI Medication side effects- none Any concerns- no Any change in general medical condition-had a  Bout of diverticulitis Effectiveness of current meds- good PMP AWARE website reviewed:  Yes Any suspicious activity on PMP Aware: No LME daily dose: 1  Contract on file 05/09/19 Last UDS  05/09/19 good results, as she was discontinug alprazolam  History of overdose or risk of abuse no  1. Motion sickness, subsequent encounter  2. Pure hypercholesterolemia  3. Gastroesophageal reflux disease without esophagitis  4. Essential hypertension  5. Anxiety   HPI: Whitney Ross is a 64 y.o. female presenting on 05/09/2019 for Follow-up (ER)    Past Medical History:  Diagnosis Date  . Chronic kidney disease    hx muscle stones  . Diverticulitis   . Elevated cholesterol   . Hypertension   . Lumbago   . Neck pain   . Nephrolithiasis    Relevant past medical, surgical, family and social history reviewed and updated as indicated. Interim medical history since our last visit reviewed. Allergies and medications reviewed and updated. DATA REVIEWED: CHART IN EPIC  Family History reviewed for pertinent findings.  Review of Systems  Constitutional: Positive for irritability. Negative for activity change, fatigue and fever.  HENT: Negative.  Negative for hoarse voice.   Eyes: Negative.   Respiratory: Negative.  Negative for cough.   Cardiovascular: Negative.  Negative for chest pain.  Gastrointestinal: Positive for abdominal pain, diarrhea and nausea. Negative for heartburn.  Endocrine: Negative.   Genitourinary: Positive for frequency. Negative for dysuria.  Musculoskeletal: Negative.   Skin: Negative.   Neurological: Negative.   Psychiatric/Behavioral: The patient is nervous/anxious.     Allergies as of 05/09/2019      Reactions   Cortisporin [bacitra-neomycin-polymyxin-hc]  Medication List       Accurate as of May 09, 2019 11:59 PM. If you have any questions, ask your nurse or doctor.        STOP taking these medications   ALPRAZolam 0.5 MG tablet Commonly known as: Xanax Stopped by: Terald Sleeper, PA-C   amoxicillin-clavulanate 875-125 MG  tablet Commonly known as: AUGMENTIN Stopped by: Terald Sleeper, PA-C   ciprofloxacin 500 MG tablet Commonly known as: Cipro Stopped by: Terald Sleeper, PA-C   predniSONE 20 MG tablet Commonly known as: Deltasone Stopped by: Terald Sleeper, PA-C     TAKE these medications   albuterol 108 (90 Base) MCG/ACT inhaler Commonly known as: VENTOLIN HFA INHALE TWO PUFFS INTO THE LUNGS EVERY 6 HOURS AS NEEDED FOR WHEEZING OR SHORTNESS OF BREATH   cetirizine 10 MG tablet Commonly known as: ZYRTEC Take 1 tablet (10 mg total) by mouth daily.   clobetasol ointment 0.05 % Commonly known as: TEMOVATE Apply 1 application topically 2 (two) times daily.   diazepam 5 MG tablet Commonly known as: VALIUM Take 1 tablet (5 mg total) by mouth every 12 (twelve) hours as needed for anxiety. What changed: See the new instructions. Changed by: Terald Sleeper, PA-C   fluconazole 150 MG tablet Commonly known as: Diflucan 1 po q week x 4 weeks   fluticasone 50 MCG/ACT nasal spray Commonly known as: FLONASE Place 2 sprays into both nostrils daily.   ibuprofen 800 MG tablet Commonly known as: ADVIL Take 1 tablet (800 mg total) by mouth every 8 (eight) hours as needed for moderate pain.   lisinopril-hydrochlorothiazide 20-12.5 MG tablet Commonly known as: Zestoretic Take 1 tablet by mouth daily.   pantoprazole 40 MG tablet Commonly known as: PROTONIX Take 1 tablet (40 mg total) by mouth daily.   pravastatin 20 MG tablet Commonly known as: PRAVACHOL Take 1 tablet (20 mg total) by mouth daily. (Needs to be seen before next refill)          Objective:    BP 107/66   Pulse 64   Temp 98.6 F (37 C)   Ht _0  (1.651 m)   Wt 190 lb 9.6 oz (86.5 kg)   SpO2 99%   BMI 31.72 kg/m   Allergies  Allergen Reactions  . Cortisporin [Bacitra-Neomycin-Polymyxin-Hc]     Wt Readings from Last 3 Encounters:  05/09/19 190 lb 9.6 oz (86.5 kg)  07/19/18 206 lb 6.4 oz (93.6 kg)  03/23/18 208 lb (94.3  kg)    Physical Exam Constitutional:      General: She is not in acute distress.    Appearance: Normal appearance. She is well-developed.  HENT:     Head: Normocephalic and atraumatic.  Cardiovascular:     Rate and Rhythm: Normal rate.  Pulmonary:     Effort: Pulmonary effort is normal.  Abdominal:     Palpations: There is no mass.     Tenderness: There is abdominal tenderness. There is no guarding or rebound.     Hernia: No hernia is present.  Skin:    General: Skin is warm and dry.     Findings: No rash.  Neurological:     Mental Status: She is alert and oriented to person, place, and time.     Deep Tendon Reflexes: Reflexes are normal and symmetric.     Results for orders placed or performed in visit on 05/09/19  CBC with Differential/Platelet  Result Value Ref Range   WBC 6.8  3.4 - 10.8 x10E3/uL   RBC 4.09 3.77 - 5.28 x10E6/uL   Hemoglobin 12.4 11.1 - 15.9 g/dL   Hematocrit 37.2 34.0 - 46.6 %   MCV 91 79 - 97 fL   MCH 30.3 26.6 - 33.0 pg   MCHC 33.3 31.5 - 35.7 g/dL   RDW 12.3 11.7 - 15.4 %   Platelets 305 150 - 450 x10E3/uL   Neutrophils 51 Not Estab. %   Lymphs 38 Not Estab. %   Monocytes 8 Not Estab. %   Eos 2 Not Estab. %   Basos 1 Not Estab. %   Neutrophils Absolute 3.5 1.4 - 7.0 x10E3/uL   Lymphocytes Absolute 2.6 0.7 - 3.1 x10E3/uL   Monocytes Absolute 0.5 0.1 - 0.9 x10E3/uL   EOS (ABSOLUTE) 0.1 0.0 - 0.4 x10E3/uL   Basophils Absolute 0.1 0.0 - 0.2 x10E3/uL   Immature Granulocytes 0 Not Estab. %   Immature Grans (Abs) 0.0 0.0 - 0.1 x10E3/uL  CMP14+EGFR  Result Value Ref Range   Glucose 92 65 - 99 mg/dL   BUN 11 8 - 27 mg/dL   Creatinine, Ser 0.72 0.57 - 1.00 mg/dL   GFR calc non Af Amer 91 >59 mL/min/1.73   GFR calc Af Amer 105 >59 mL/min/1.73   BUN/Creatinine Ratio 15 12 - 28   Sodium 143 134 - 144 mmol/L   Potassium 3.8 3.5 - 5.2 mmol/L   Chloride 101 96 - 106 mmol/L   CO2 24 20 - 29 mmol/L   Calcium 9.4 8.7 - 10.3 mg/dL   Total Protein 6.6  6.0 - 8.5 g/dL   Albumin 4.0 3.8 - 4.8 g/dL   Globulin, Total 2.6 1.5 - 4.5 g/dL   Albumin/Globulin Ratio 1.5 1.2 - 2.2   Bilirubin Total 0.3 0.0 - 1.2 mg/dL   Alkaline Phosphatase 58 39 - 117 IU/L   AST 19 0 - 40 IU/L   ALT 18 0 - 32 IU/L  ToxASSURE Select 13 (MW), Urine  Result Value Ref Range   Summary Note       Assessment & Plan:   1. Motion sickness, subsequent encounter - diazepam (VALIUM) 5 MG tablet; Take 1 tablet (5 mg total) by mouth every 12 (twelve) hours as needed for anxiety.  Dispense: 45 tablet; Refill: 5  2. Pure hypercholesterolemia - pravastatin (PRAVACHOL) 20 MG tablet; Take 1 tablet (20 mg total) by mouth daily. (Needs to be seen before next refill)  Dispense: 30 tablet; Refill: 5  3. Gastroesophageal reflux disease without esophagitis - pantoprazole (PROTONIX) 40 MG tablet; Take 1 tablet (40 mg total) by mouth daily.  Dispense: 30 tablet; Refill: 5 - CBC with Differential/Platelet - CMP14+EGFR - Ambulatory referral to Gastroenterology  4. Essential hypertension - lisinopril-hydrochlorothiazide (ZESTORETIC) 20-12.5 MG tablet; Take 1 tablet by mouth daily.  Dispense: 30 tablet; Refill: 5 - CBC with Differential/Platelet - CMP14+EGFR  5. Anxiety - ToxASSURE Select 13 (MW), Urine  6. Diverticulitis - Ambulatory referral to Gastroenterology  7. Snoring - Ambulatory referral to Sleep Studies  Continue all other maintenance medications as listed above.  Follow up plan: Recheck 3 months  Educational handout given for diverticulitis  Terald Sleeper PA-C [facility] [address]  [place], [place] [address] [phone]   05/17/2019, 12:29 PM

## 2019-05-17 NOTE — Telephone Encounter (Signed)
This may be one of her open charts?

## 2019-05-18 NOTE — Telephone Encounter (Signed)
Left detailed message.   

## 2019-05-24 ENCOUNTER — Ambulatory Visit: Payer: BC Managed Care – PPO | Attending: Internal Medicine

## 2019-05-28 ENCOUNTER — Encounter: Payer: Self-pay | Admitting: Gastroenterology

## 2019-05-30 ENCOUNTER — Ambulatory Visit: Payer: BC Managed Care – PPO | Admitting: Neurology

## 2019-05-30 ENCOUNTER — Encounter: Payer: Self-pay | Admitting: Neurology

## 2019-05-30 ENCOUNTER — Other Ambulatory Visit: Payer: Self-pay

## 2019-05-30 VITALS — BP 102/62 | HR 74 | Temp 97.2°F | Ht 65.0 in | Wt 183.0 lb

## 2019-05-30 DIAGNOSIS — K219 Gastro-esophageal reflux disease without esophagitis: Secondary | ICD-10-CM | POA: Diagnosis not present

## 2019-05-30 DIAGNOSIS — F17200 Nicotine dependence, unspecified, uncomplicated: Secondary | ICD-10-CM | POA: Diagnosis not present

## 2019-05-30 DIAGNOSIS — J0191 Acute recurrent sinusitis, unspecified: Secondary | ICD-10-CM | POA: Diagnosis not present

## 2019-05-30 DIAGNOSIS — F419 Anxiety disorder, unspecified: Secondary | ICD-10-CM

## 2019-05-30 DIAGNOSIS — G4719 Other hypersomnia: Secondary | ICD-10-CM

## 2019-05-30 DIAGNOSIS — G2581 Restless legs syndrome: Secondary | ICD-10-CM

## 2019-05-30 DIAGNOSIS — D508 Other iron deficiency anemias: Secondary | ICD-10-CM

## 2019-05-30 NOTE — Patient Instructions (Signed)

## 2019-05-30 NOTE — Progress Notes (Signed)
SLEEP MEDICINE CLINIC    Provider:  Larey Seat, MD  Primary Care Physician:  Terald Sleeper, PA-C Josie Saunders  Referring Provider:          Chief Complaint according to patient   Patient presents with:    . New Patient (Initial Visit)     pt states that her husband has told her she snores in her sleep as well as caught her waking herself up in sleep. pt states she is asleep and doesnt remember this. she has never had a SS so PCP wanted to asses      HISTORY OF PRESENT ILLNESS:  Whitney Ross is a 64 year- old  Caucasian female patient seen here as a referral on 05/30/2019 Chief concern according to patient : " I have no energy, I snore loudly, and I have trouble falling asleep"   I have the pleasure of seeing [name] today, a right -handed  married Caucasian female with a  sleep disorder.  She   has a past medical history of Chronic kidney disease, Diverticulitis, Elevated cholesterol, Hypertension, Lumbago, Neck pain, and Nephrolithiasis. weight loss due to diverticulitis , Knee arthroscopy 07-2018, Baker's cyst. Left knee.      Sleep relevant medical history: Nocturia: 3-4 times , on diuretic. No Tonsillectomy, cervical spine surgery/ deviated septum repair, but chronic sinusitis. Active smoking.    Family medical /sleep history: no other family member on CPAP with OSA, insomnia, sleep walkers.    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  Adult son. With anxiety depression. The patient currently works late.  Tobacco use- 1 ppd.  ETOH use: none ,  Caffeine intake in form of Coffee( none) Soda( none) Tea (none) No energy drinks. Regular exercise in form of walking/      Sleep habits are as follows: The patient's dinner time is between 6 PM. The patient goes to bed at 10.30 and watches TV in bed- with the dogs.   She  continues to sleep for 2-3  hours,  wakes for 3-4 bathroom breaks, the first time at 2-3 AM.   The preferred sleep position is right side, with the support of 2 pillows. Her husband sleeps in another bed, he snores loudly, coughs and twitches.   Dreams are reportedly rare.Marland Kitchen  7-8 AM is the usual rise time. The patient wakes up spontaneously.  She reports not feeling refreshed or restored in AM, with symptoms such as dry mouth, morning headaches, and residual fatigue.  Naps are taken infrequently.    Review of Systems: Out of a complete 14 system review, the patient complains of only the following symptoms, and all other reviewed systems are negative.:  Fatigue, sleepiness , snoring, fragmented sleep, Insomnia due to nocturia.    How likely are you to doze in the following situations: 0 = not likely, 1 = slight chance, 2 = moderate chance, 3 = high chance   Sitting and Reading? Watching Television? Sitting inactive in a public place (theater or meeting)? As a passenger in a car for an hour without a break? Lying down in the afternoon when circumstances permit? Sitting and talking to someone? Sitting quietly after lunch without alcohol? In a car, while stopped for a few minutes in traffic?   Total = 13/ 24  points   FSS endorsed at 44/ 63 points.  GDS 3/ 15   RLS   Mrs. [name] also endorsed numbness in her right arm if she sleeps in a certain position, this is kind of forced her to sleep more supine with multiple pillows.  Social History   Socioeconomic History  . Marital status: Married    Spouse name: Not on file  . Number of children: Not on file  . Years of education: Not on file  . Highest education level: Not on file  Occupational History  . Not on file  Tobacco Use  . Smoking status: Current Every Day Smoker    Packs/day: 1.00  . Smokeless tobacco: Never Used  Substance and Sexual Activity  . Alcohol use: No    Comment: RARELY  . Drug use: No  . Sexual  activity: Not Currently  Other Topics Concern  . Not on file  Social History Narrative  . Not on file   Social Determinants of Health   Financial Resource Strain:   . Difficulty of Paying Living Expenses:   Food Insecurity:   . Worried About Charity fundraiser in the Last Year:   . Arboriculturist in the Last Year:   Transportation Needs:   . Film/video editor (Medical):   Marland Kitchen Lack of Transportation (Non-Medical):   Physical Activity:   . Days of Exercise per Week:   . Minutes of Exercise per Session:   Stress:   . Feeling of Stress :   Social Connections:   . Frequency of Communication with Friends and Family:   . Frequency of Social Gatherings with Friends and Family:   . Attends Religious Services:   . Active Member of Clubs or Organizations:   . Attends Archivist Meetings:   Marland Kitchen Marital Status:     Family History  Problem Relation Age of Onset  . Heart failure Mother   . Cancer Father        MELENOMA  . Hypertension Sister   . Hyperlipidemia Sister   . Hyperlipidemia Brother   . Hypertension Brother   . Colon cancer Neg Hx   . Esophageal cancer Neg Hx   . Stomach cancer Neg Hx   . Rectal cancer Neg Hx     Past Medical History:  Diagnosis Date  . Chronic kidney disease    hx muscle stones  . Diverticulitis   . Elevated cholesterol   . Hypertension   . Lumbago   . Neck pain   . Nephrolithiasis     Past Surgical History:  Procedure Laterality Date  . CESAREAN SECTION  1987  . CYSTOSCOPY    . VAGINAL HYSTERECTOMY  1997   TOTAL VAGINAL     Current Outpatient Medications on File Prior to Visit  Medication Sig Dispense Refill  . albuterol (VENTOLIN HFA) 108 (90 Base) MCG/ACT inhaler INHALE TWO PUFFS INTO THE LUNGS EVERY 6 HOURS AS NEEDED FOR WHEEZING OR SHORTNESS OF BREATH 8.5 g 0  . cetirizine (ZYRTEC) 10 MG tablet Take 1 tablet (10 mg total) by mouth daily. 30 tablet 11  . diazepam (VALIUM) 5 MG tablet Take 1 tablet (5 mg total) by  mouth every 12 (twelve) hours as needed for anxiety. 45 tablet 5  . fluconazole (DIFLUCAN) 150 MG tablet 1 po q week x 4 weeks 4 tablet 0  . fluticasone (FLONASE) 50 MCG/ACT nasal spray Place 2 sprays into both nostrils daily. 16 g 6  . ibuprofen (  ADVIL) 800 MG tablet Take 1 tablet (800 mg total) by mouth every 8 (eight) hours as needed for moderate pain. 60 tablet 2  . lisinopril-hydrochlorothiazide (ZESTORETIC) 20-12.5 MG tablet Take 1 tablet by mouth daily. 30 tablet 5  . pantoprazole (PROTONIX) 40 MG tablet Take 1 tablet (40 mg total) by mouth daily. 30 tablet 5  . pravastatin (PRAVACHOL) 20 MG tablet Take 1 tablet (20 mg total) by mouth daily. (Needs to be seen before next refill) 30 tablet 5   No current facility-administered medications on file prior to visit.    Allergies  Allergen Reactions  . Cortisporin [Bacitra-Neomycin-Polymyxin-Hc]     Physical exam:  Today's Vitals   05/30/19 1459  BP: 102/62  Pulse: 74  Temp: (!) 97.2 F (36.2 C)  Weight: 183 lb (83 kg)  Height: 5\' 5"  (1.651 m)   Body mass index is 30.45 kg/m.   Wt Readings from Last 3 Encounters:  05/30/19 183 lb (83 kg)  05/09/19 190 lb 9.6 oz (86.5 kg)  07/19/18 206 lb 6.4 oz (93.6 kg)     Ht Readings from Last 3 Encounters:  05/30/19 5\' 5"  (1.651 m)  05/09/19 5\' 5"  (1.651 m)  07/19/18 5\' 5"  (1.651 m)      General: The patient is awake, alert and appears not in acute distress.  The patient is well groomed. Head: Normocephalic, atraumatic. Neck is supple. Mallampati 3,  neck circumference: 14. 5 inches .  Nasal airflow  patent.  Retrognathia is not seen.  Dental status: intact  Cardiovascular:  Regular rate and cardiac rhythm by pulse,  without distended neck veins. Respiratory: Lungs are clear to auscultation.  Skin:  Without evidence of ankle edema, or rash. Trunk: The patient's posture is erect.   Neurologic exam : The patient is awake and alert, oriented to place and time.   Memory  subjective described as intact.  Attention span & concentration ability appears normal.  Speech is fluent,  without  dysarthria, dysphonia or aphasia.  Mood and affect are appropriate.   Cranial nerves: no loss of smell or taste reported  Pupils are equal and briskly reactive to light. Funduscopic exam deferred  Extraocular movements in vertical and horizontal planes were intact and without nystagmus. No Diplopia. Visual fields by finger perimetry are intact. Hearing was intact to soft voice and finger rubbing.    Facial sensation intact to fine touch.  Facial motor strength is symmetric and tongue and uvula move midline.  Neck ROM : rotation, tilt and flexion extension were normal for age and shoulder shrug was symmetrical.    Motor exam:  Symmetric bulk, tone and ROM.   Normal tone without cog wheeling, symmetric grip strength . Sensory:  Fine touch, pinprick and vibration were tested  and  normal.  Proprioception tested in the upper extremities was normal. Coordination: Rapid alternating movements in the fingers/hands were of normal speed.  The Finger-to-nose maneuver was intact without evidence of ataxia, dysmetria or tremor. No changes in penmanship.  Gait and station: Patient could rise unassisted from a seated position, walked without assistive device.  Stance is of normal width/ base and the patient turned with 3 steps.  Toe and heel walk were deferred.  Deep tendon reflexes: in the  upper and lower extremities are symmetric and intact.  Babinski response was deferred.     The patient been diagnosed with a generalized anxiety disorder and has used diazepam in the past she has tried SSRIs but is currently not on  she has hypercholesterolemia, motion sickness, GERD, essential hypertension.  I believe that she has extremely that she has obstructive sleep apnea by upper airway anatomy, BMI over 30 and also just by the witnessed snoring that her husband has noted.  I like for her to  undergo a home sleep test unless her insurance would allow an in lab sleep test.  It is hard for the patient to have an overnight test here at the office due to her late work hours.  In addition to screening her for obstructive sleep apnea I also would like to evaluate her for the restless leg component.  I would like to give her some advice on helping to stop smoking.  This will help with sinusitis recurrent.  I reviewed her laboratory they look excellent.   After spending a total time of 35 minutes face to face and additional time for physical and neurologic examination, review of laboratory studies,  personal review of imaging studies, reports and results of other testing and review of referral information / records as far as provided in visit, I have established the following assessments:  1) EDS- Epworth 13/ 24. 2) Snoring , OSA-  And lack of energy, high degree of fatigue.  3) RLS   My Plan is to proceed with:  1) Ferritin screen  2) HST or attended sleep study.  3) smoking cessation.   I would like to thank Terald Sleeper, PA-C and Terald Sleeper, Pa-c No address on file for allowing me to meet with and to take care of this pleasant patient.   In short, Whitney Ross is presenting with EDS, RLS and fatigue.   I plan to follow up either personally or through our NP within **2-3 * month.   CC: I will share my notes with PCP   Electronically signed by: Larey Seat, MD 05/30/2019 3:24 PM  [facility] Board certified by The [employer] of Sleep Medicine and Diplomate of the [employer] of Sleep Medicine. Board certified In Neurology through the [employer], Fellow of the [employer] of Neurology. Medical Director of [facility].

## 2019-05-31 ENCOUNTER — Encounter: Payer: Self-pay | Admitting: Neurology

## 2019-05-31 LAB — IRON,TIBC AND FERRITIN PANEL
Ferritin: 187 ng/mL — ABNORMAL HIGH (ref 15–150)
Iron Saturation: 15 % (ref 15–55)
Iron: 46 ug/dL (ref 27–139)
Total Iron Binding Capacity: 297 ug/dL (ref 250–450)
UIBC: 251 ug/dL (ref 118–369)

## 2019-05-31 NOTE — Progress Notes (Signed)
Low iron saturation, high ferritin level. I recommend an Iron supplement OTC- as in a multimineral supplement

## 2019-06-01 ENCOUNTER — Other Ambulatory Visit: Payer: Self-pay | Admitting: Family Medicine

## 2019-06-05 ENCOUNTER — Encounter: Payer: Self-pay | Admitting: *Deleted

## 2019-06-05 NOTE — Telephone Encounter (Signed)
Called pt. Discussed results. She has problems with constipation/diverticulitis. She will start a prenatal vitamin OTC po daily given this instead of iron 325 OTC. She has no further questions.

## 2019-06-10 ENCOUNTER — Ambulatory Visit (INDEPENDENT_AMBULATORY_CARE_PROVIDER_SITE_OTHER): Payer: BC Managed Care – PPO | Admitting: Neurology

## 2019-06-10 DIAGNOSIS — K219 Gastro-esophageal reflux disease without esophagitis: Secondary | ICD-10-CM

## 2019-06-10 DIAGNOSIS — F17200 Nicotine dependence, unspecified, uncomplicated: Secondary | ICD-10-CM

## 2019-06-10 DIAGNOSIS — G2581 Restless legs syndrome: Secondary | ICD-10-CM

## 2019-06-10 DIAGNOSIS — G4710 Hypersomnia, unspecified: Secondary | ICD-10-CM | POA: Diagnosis not present

## 2019-06-10 DIAGNOSIS — F419 Anxiety disorder, unspecified: Secondary | ICD-10-CM

## 2019-06-10 DIAGNOSIS — J0191 Acute recurrent sinusitis, unspecified: Secondary | ICD-10-CM

## 2019-06-10 DIAGNOSIS — G4719 Other hypersomnia: Secondary | ICD-10-CM

## 2019-06-20 ENCOUNTER — Telehealth: Payer: Self-pay | Admitting: Neurology

## 2019-06-20 DIAGNOSIS — G2581 Restless legs syndrome: Secondary | ICD-10-CM | POA: Insufficient documentation

## 2019-06-20 DIAGNOSIS — G4719 Other hypersomnia: Secondary | ICD-10-CM | POA: Insufficient documentation

## 2019-06-20 MED ORDER — ROPINIROLE HCL 1 MG PO TABS: 0.5000 mg | ORAL_TABLET | Freq: Every day | ORAL | 0 refills | Status: DC

## 2019-06-20 NOTE — Progress Notes (Signed)
I would like to concentrate on the RLS/ PLM component and see if insomnia improves with treatment of the condition by dopaminergic agonists.  Back and neck pain to be addressed by ortho.  No significant apnea was noted.

## 2019-06-20 NOTE — Telephone Encounter (Signed)
-----   Message from Larey Seat, MD sent at 06/20/2019 12:22 PM EDT ----- I would like to concentrate on the RLS/ PLM component and see if insomnia improves with treatment of the condition by dopaminergic agonists.  Back and neck pain to be addressed by ortho.  No significant apnea was noted.

## 2019-06-20 NOTE — Procedures (Signed)
PATIENT'S NAME:  Whitney Ross, Whitney Ross DOB:      1955/03/15      MR#:    [id number]     DATE OF RECORDING: 06/10/2019 REFERRING M.D.:  [name] PA-C Study Performed:   Baseline Polysomnogram HISTORY:  Whitney Ross is a 64 year- old Caucasian female patient seen here upon PCP referral on 05/30/2019 Chief concern according to patient : " I have no energy, I snore loudly, and I have trouble falling asleep"   Whitney Ross is a right -handed, married Caucasian female with a medical history of Chronic kidney disease, Elevated cholesterol, Hypertension, Neck pain, and Nephrolithiasis. weight loss due to diverticulitis, Knee arthroscopy 07-2018, Baker's cyst/ Left knee.  Sleep relevant medical history: Nocturia: 3-4 times, on diuretic, chronic sinusitis. Active smoker.    The patient endorsed the Epworth Sleepiness Scale at 13/24 points.   The patient's weight 183 pounds with a height of 65 (inches), resulting in a BMI of 30.5 kg/m2. The patient's neck circumference measured 14.5 inches.  CURRENT MEDICATIONS: Ventolin HFA, Zyrtec, Valium, Diflucan, Flonase, Advil, Zestoretic, Protonix, Pravachol.   PROCEDURE:  This is a multichannel digital polysomnogram utilizing the Somnostar 11.2 system.  Electrodes and sensors were applied and monitored per AASM Specifications.   EEG, EOG, Chin and Limb EMG, were sampled at 200 Hz.  ECG, Snore and Nasal Pressure, Thermal Airflow, Respiratory Effort, CPAP Flow and Pressure, Oximetry was sampled at 50 Hz. Digital video and audio were recorded.      BASELINE STUDY: Lights Out was at 20:55 and Lights On at 05:00.  Total recording time (TRT) was 485.5 minutes, with a total sleep time (TST) of 346 minutes.   The patient's sleep latency was 133 minutes.  REM latency was 167 minutes.  The sleep efficiency was 71.3 %.     SLEEP ARCHITECTURE: WASO (Wake after sleep onset) was 75.5 minutes.  There were 5 minutes in Stage N1, 240.5 minutes Stage N2, 41 minutes Stage N3 and  59.5 minutes in Stage REM.  The percentage of Stage N1 was 1.4%, Stage N2 was 69.5%, Stage N3 was 11.8% and Stage R (REM sleep) was 17.2%.   RESPIRATORY ANALYSIS:  There were a total of 11 respiratory events:  11 hypopneas with a hypopnea index of 1.9 /hour.     The total APNEA/HYPOPNEA INDEX (AHI) was 1.9/hour.  8 events occurred in REM sleep and 6 events in NREM. The REM AHI was  8.1 /hour, versus a non-REM AHI of 0.6. The patient spent 280.5 minutes of total sleep time in the supine position and 66 minutes in non-supine. The supine AHI was 1.9 versus a non-supine AHI of 1.8/h.  OXYGEN SATURATION & C02:  The Wake baseline 02 saturation was 96%, with the lowest being 83%. Time spent below 89% saturation equaled 18 minutes.  The arousals were noted as: 25 were spontaneous, 24 were associated with PLMs, 3 were associated with respiratory events. The patient had a total of 130 Periodic Limb Movements.  The Periodic Limb Movement (PLM) Arousal index was 4.2/hour. All PLMs clustered between 1.30 AM and 3.20 AM and supine NREM sleep.  Audio and video analysis did not show any abnormal or unusual movements, behaviors, phonations or vocalizations.   The patient took a single bathroom break. Moderate Snoring was noted. EKG was in keeping with regular sinus rhythm (NSR).   IMPRESSION:  1. No evidence of Obstructive Sleep Apnea (OSA). No indication of prolonged sleep hypoxemia;  2. Moderate Periodic Limb Movement  Disorder (PLMD), related to supine sleep, and only seen in NREM sleep.  3. Primary Snoring. 4. Prolonged sleep latency, reduced sleep efficiency- Insomnia is present.   RECOMMENDATIONS: I presumed before that RLS was related to iron deficiency and /or chronic back pain history. There is no indication of REM sleep behavior disorder, as all PLM arousals were seen in NREM sleep.  We will offer to address the RLS component and try a dopaminergic agonist for this.  I urge to have her back pain and  neck appropriately followed by orthopedic MD.    I certify that I have reviewed the entire raw data recording prior to the issuance of this report in accordance with the Standards of Accreditation of the American Academy of Sleep Medicine (AASM)    Larey Seat, MD Diplomat, American Board of Psychiatry and Neurology  Diplomat, American Board of Sleep Medicine Market researcher, [facility] [employer]

## 2019-06-20 NOTE — Addendum Note (Signed)
Addended by: Larey Seat on: 06/20/2019 12:22 PM   Modules accepted: Orders

## 2019-06-20 NOTE — Telephone Encounter (Signed)
Called the patient and reviewed the sleep study results with the patient. Advised there was no organic sleep disorder present except the finding with the PLM's. Informed the patient we will continue to treat and follow the patient for this treatment. Was able to establish an apt 2:30 pm on 6/22 for the patient. Pt verbalized understanding. Pt had no questions at this time but was encouraged to call back if questions arise.

## 2019-06-26 ENCOUNTER — Ambulatory Visit: Payer: BC Managed Care – PPO | Admitting: Gastroenterology

## 2019-06-26 ENCOUNTER — Encounter: Payer: Self-pay | Admitting: Gastroenterology

## 2019-06-26 VITALS — BP 122/70 | HR 68 | Temp 98.7°F | Ht 64.25 in | Wt 185.0 lb

## 2019-06-26 DIAGNOSIS — K5732 Diverticulitis of large intestine without perforation or abscess without bleeding: Secondary | ICD-10-CM | POA: Diagnosis not present

## 2019-06-26 MED ORDER — SUPREP BOWEL PREP KIT 17.5-3.13-1.6 GM/177ML PO SOLN: 1.0000 | ORAL | 0 refills | Status: DC

## 2019-06-26 NOTE — Progress Notes (Signed)
HPI: This is a very pleasant 64 year old woman who was referred to me by Terald Sleeper, PA-C  to evaluate diverticulitis.    About 2 months ago she was having lower abdominal pains and fevers.  She was seen by telemedicine visit and was put on Cipro and Flagyl.  This caused significant vomiting.  This was changed to Augmentin and her symptoms improved however they returned 2 or 3 weeks later.  She presented to [facility], [place].  This was in March some time.  She tells me she was evaluated in the emergency room there and they did a CT scan which confirmed diverticulitis.  We have none of those results here at the time of this visit.  She was put on another 1 week round of Augmentin.  Her symptoms are "a lot better" but she is not completely back to normal.  She is still bothered by some vague lower abdominal discomforts.  She believes she has lost 35 to 40 pounds over this past 2 or 3 months because she is really nervous about eating normally again.  She thinks cucumbers are the cause of her issues.  She has not seen any blood in her stool  Colon cancer does not run in her family  Old Data Reviewed: I did a colonoscopy for her April 2013 for routine risk colon cancer screening.  She had left-sided diverticulosis, hemorrhoids.  No polyps or cancers.  Blood work March 2021 shows normal complete metabolic profile and normal CBC  Review of systems: Pertinent positive and negative review of systems were noted in the above HPI section. All other review negative.   Past Medical History:  Diagnosis Date  . Chronic kidney disease    hx muscle stones  . Diverticulitis   . Elevated cholesterol   . Hypertension   . Lumbago   . Neck pain   . Nephrolithiasis     Past Surgical History:  Procedure Laterality Date  . CESAREAN SECTION  1987  . CYSTOSCOPY    . KNEE ARTHROSCOPY W/ MENISCAL REPAIR Left   . VAGINAL HYSTERECTOMY  1997   TOTAL VAGINAL    Current Outpatient  Medications  Medication Sig Dispense Refill  . albuterol (VENTOLIN HFA) 108 (90 Base) MCG/ACT inhaler INHALE TWO PUFFS INTO THE LUNGS EVERY 6 HOURS AS NEEDED FOR WHEEZING OR SHORTNESS OF BREATH 8.5 g 2  . cetirizine (ZYRTEC) 10 MG tablet Take 1 tablet (10 mg total) by mouth daily. 30 tablet 11  . diazepam (VALIUM) 5 MG tablet Take 1 tablet (5 mg total) by mouth every 12 (twelve) hours as needed for anxiety. 45 tablet 5  . fluticasone (FLONASE) 50 MCG/ACT nasal spray Place 2 sprays into both nostrils daily. 16 g 6  . ibuprofen (ADVIL) 800 MG tablet Take 1 tablet (800 mg total) by mouth every 8 (eight) hours as needed for moderate pain. 60 tablet 2  . lisinopril-hydrochlorothiazide (ZESTORETIC) 20-12.5 MG tablet Take 1 tablet by mouth daily. 30 tablet 5  . pantoprazole (PROTONIX) 40 MG tablet Take 1 tablet (40 mg total) by mouth daily. 30 tablet 5  . pravastatin (PRAVACHOL) 20 MG tablet Take 1 tablet (20 mg total) by mouth daily. (Needs to be seen before next refill) 30 tablet 5  . Prenatal Vit-DSS-Fe Cbn-FA (PRENATAL AD PO) Take 1 Dose by mouth daily. For low iron levels    . rOPINIRole (REQUIP) 1 MG tablet Take 0.5 tablets (0.5 mg total) by mouth at bedtime. 45 tablet 0  .  fluconazole (DIFLUCAN) 150 MG tablet 1 po q week x 4 weeks (Patient not taking: Reported on 06/26/2019) 4 tablet 0   No current facility-administered medications for this visit.    Allergies as of 06/26/2019 - Review Complete 06/26/2019  Allergen Reaction Noted  . Cortisporin [bacitra-neomycin-polymyxin-hc]  05/18/2012    Family History  Problem Relation Age of Onset  . Heart failure Mother   . Melanoma Father   . Hypertension Sister   . Hyperlipidemia Sister   . Hyperlipidemia Brother   . Hypertension Brother   . Colon cancer Neg Hx   . Esophageal cancer Neg Hx   . Stomach cancer Neg Hx   . Rectal cancer Neg Hx     Social History   Socioeconomic History  . Marital status: Married    Spouse name: Not on file   . Number of children: 1  . Years of education: Not on file  . Highest education level: Not on file  Occupational History  . Occupation: Restaurant manager, fast food: [employer]  Tobacco Use  . Smoking status: Current Every Day Smoker    Packs/day: 1.00  . Smokeless tobacco: Never Used  Substance and Sexual Activity  . Alcohol use: Yes    Comment: RARELY  . Drug use: No  . Sexual activity: Not Currently  Other Topics Concern  . Not on file  Social History Narrative  . Not on file   Social Determinants of Health   Financial Resource Strain:   . Difficulty of Paying Living Expenses:   Food Insecurity:   . Worried About Charity fundraiser in the Last Year:   . Arboriculturist in the Last Year:   Transportation Needs:   . Film/video editor (Medical):   Marland Kitchen Lack of Transportation (Non-Medical):   Physical Activity:   . Days of Exercise per Week:   . Minutes of Exercise per Session:   Stress:   . Feeling of Stress :   Social Connections:   . Frequency of Communication with Friends and Family:   . Frequency of Social Gatherings with Friends and Family:   . Attends Religious Services:   . Active Member of Clubs or Organizations:   . Attends Archivist Meetings:   Marland Kitchen Marital Status:   Intimate Partner Violence:   . Fear of Current or Ex-Partner:   . Emotionally Abused:   Marland Kitchen Physically Abused:   . Sexually Abused:      Physical Exam: BP 122/70 (BP Location: Left Arm, Patient Position: Sitting, Cuff Size: Normal)   Pulse 68   Temp 98.7 F (37.1 C)   Ht 5' 4.25" (1.632 m) Comment: height measured without shoes  Wt 185 lb (83.9 kg)   BMI 31.51 kg/m  Constitutional: generally well-appearing Psychiatric: alert and oriented x3 Eyes: extraocular movements intact Mouth: oral pharynx moist, no lesions Neck: supple no lymphadenopathy Cardiovascular: heart regular rate and rhythm Lungs: clear to auscultation bilaterally Abdomen: soft, minimal tenderness mid  lower abdomen, nondistended, no obvious ascites, no peritoneal signs, normal bowel sounds Extremities: no lower extremity edema bilaterally Skin: no lesions on visible extremities   Assessment and plan: 64 y.o. female with history of acute diverticulitis, weight loss  First we will get records sent from the outside facility, most importantly CT scan abdomen pelvis in March in [facility] which she tells me confirmed acute diverticulitis.  She is clinically "a lot better" than she was at that point.  She is  certainly not back to normal but has only very minimal tenderness in her abdomen.  I have very low concern for ongoing infection in her abdomen right now I think it is safe to proceed with colonoscopy at this point to exclude other potential causes.  Sometimes small colon cancer can mimic diverticulitis clinically.   Please see the "Patient Instructions" section for addition details about the plan.   Owens Loffler, MD [facility] 06/26/2019, 11:23 AM  Cc: Terald Sleeper, PA-C  Total time on date of encounter was 45 minutes (this included time spent preparing to see the patient reviewing records; obtaining and/or reviewing separately obtained history; performing a medically appropriate exam and/or evaluation; counseling and educating the patient and family if present; ordering medications, tests or procedures if applicable; and documenting clinical information in the health record).

## 2019-06-26 NOTE — Patient Instructions (Addendum)
If you are age 62 or older, your body mass index should be between 23-30. Your Body mass index is 31.51 kg/m. If this is out of the aforementioned range listed, please consider follow up with your Primary Care Provider.  If you are age 35 or younger, your body mass index should be between 19-25. Your Body mass index is 31.51 kg/m. If this is out of the aformentioned range listed, please consider follow up with your Primary Care Provider.   You have been scheduled for a colonoscopy. Please follow written instructions given to you at your visit today.  Please pick up your prep supplies at the pharmacy within the next 1-3 days. If you use inhalers (even only as needed), please bring them with you on the day of your procedure.  We have requested records from [facility] to obtain your CT abdomen/pelvis.  Thank you for entrusting me with your care and choosing [facility]  Dr Ardis Hughs

## 2019-06-28 ENCOUNTER — Other Ambulatory Visit: Payer: Self-pay | Admitting: Family Medicine

## 2019-06-28 DIAGNOSIS — K219 Gastro-esophageal reflux disease without esophagitis: Secondary | ICD-10-CM

## 2019-06-28 DIAGNOSIS — I10 Essential (primary) hypertension: Secondary | ICD-10-CM

## 2019-06-29 ENCOUNTER — Encounter: Payer: BC Managed Care – PPO | Admitting: Gastroenterology

## 2019-07-04 ENCOUNTER — Telehealth: Payer: Self-pay

## 2019-07-04 NOTE — Telephone Encounter (Signed)
Left message on patient's voicemail to return call to discuss Suprep not being covered by insurance.  Will continue efforts.

## 2019-07-09 ENCOUNTER — Encounter: Payer: Self-pay | Admitting: Gastroenterology

## 2019-07-10 ENCOUNTER — Ambulatory Visit (INDEPENDENT_AMBULATORY_CARE_PROVIDER_SITE_OTHER): Payer: BC Managed Care – PPO | Admitting: Nurse Practitioner

## 2019-07-10 DIAGNOSIS — J00 Acute nasopharyngitis [common cold]: Secondary | ICD-10-CM | POA: Diagnosis not present

## 2019-07-10 MED ORDER — CLENPIQ 10-3.5-12 MG-GM -GM/160ML PO SOLN: 1.0000 | ORAL | 0 refills | Status: DC

## 2019-07-10 MED ORDER — CEPHALEXIN 500 MG PO CAPS: 500.0000 mg | ORAL_CAPSULE | Freq: Two times a day (BID) | ORAL | 0 refills | Status: DC

## 2019-07-10 NOTE — Progress Notes (Signed)
Virtual Visit via telephone Note Due to COVID-19 pandemic this visit was conducted virtually. This visit type was conducted due to national recommendations for restrictions regarding the COVID-19 Pandemic (e.g. social distancing, sheltering in place) in an effort to limit this patient's exposure and mitigate transmission in our community. All issues noted in this document were discussed and addressed.  A physical exam was not performed with this format.  I connected with Whitney Ross on 07/10/19 at 1:15 by telephone and verified that I am speaking with the correct person using two identifiers. Whitney Ross is currently located at home and no one is currently with her during visit. The provider, Mary-Margaret Hassell Done, FNP is located in their office at time of visit.  I discussed the limitations, risks, security and privacy concerns of performing an evaluation and management service by telephone and the availability of in person appointments. I also discussed with the patient that there may be a patient responsible charge related to this service. The patient expressed understanding and agreed to proceed.   History and Present Illness:   Chief Complaint: Ear Pain   HPI Patient calls in c/o feeling like se ha pressure behind her ear. Ears are not hurting or draining. Has some congestion. She is suppose to have colonscopy early next weeks and dose not want to have to cancel   Review of Systems  Constitutional: Negative for chills and fever.  HENT: Positive for congestion and ear pain (fullness). Negative for sinus pain.   Respiratory: Positive for cough (light).   Cardiovascular: Negative.   Musculoskeletal: Negative.   Neurological: Positive for headaches. Negative for dizziness.  All other systems reviewed and are negative.    Observations/Objective: Alert and oriented- answers all questions appropriately No distress slight hoareness   Assessment and Plan: Whitney Ross  in today with chief complaint of Ear Pain   1. Acute nasopharyngitis 1. Take meds as prescribed 2. Use a cool mist humidifier especially during the winter months and when heat has been humid. 3. Use saline nose sprays frequently 4. Saline irrigations of the nose can be very helpful if done frequently.  * 4X daily for 1 week*  * Use of a nettie pot can be helpful with this. Follow directions with this* 5. Drink plenty of fluids 6. Keep thermostat turn down low 7.For any cough or congestion  Use plain Mucinex- regular strength or max strength is fine   * Children- consult with Pharmacist for dosing 8. For fever or aces or pains- take tylenol or ibuprofen appropriate for age and weight.  * for fevers greater than 101 orally you may alternate ibuprofen and tylenol every  3 hours.   Meds ordered this encounter  Medications  . cephALEXin (KEFLEX) 500 MG capsule    Sig: Take 1 capsule (500 mg total) by mouth 2 (two) times daily.    Dispense:  14 capsule    Refill:  0    Order Specific Question:   Supervising Provider    Answer:   Caryl Pina A [id number]         Follow Up Instructions: prn    I discussed the assessment and treatment plan with the patient. The patient was provided an opportunity to ask questions and all were answered. The patient agreed with the plan and demonstrated an understanding of the instructions.   The patient was advised to call back or seek an in-person evaluation if the symptoms worsen or if the condition fails to improve  as anticipated.  The above assessment and management plan was discussed with the patient. The patient verbalized understanding of and has agreed to the management plan. Patient is aware to call the clinic if symptoms persist or worsen. Patient is aware when to return to the clinic for a follow-up visit. Patient educated on when it is appropriate to go to the emergency department.   Time call ended:  1:27 I provided 12 minutes of  non-face-to-face time during this encounter.    Mary-Margaret Hassell Done, FNP

## 2019-07-10 NOTE — Telephone Encounter (Signed)
Spoke with April at [facility] and she states that insurance will cover Clenpiq at no cost for the patient. Patient aware that Clenpiq has been sent to pharmacy, and new instructions will be sent to her MyChart for her review.  Patient agreed to plan and verbalized understanding.

## 2019-07-16 ENCOUNTER — Telehealth: Payer: Self-pay | Admitting: Gastroenterology

## 2019-07-16 ENCOUNTER — Telehealth: Payer: Self-pay | Admitting: Physician Assistant

## 2019-07-16 NOTE — Telephone Encounter (Signed)
Pt says it isn't working.

## 2019-07-16 NOTE — Telephone Encounter (Signed)
NTBS.

## 2019-07-16 NOTE — Telephone Encounter (Signed)
Dr. Ardis Hughs Just wanted to let you know patient cancelled her colon on 07-18-19. Pt was not feeling well stated she has bronchitis.  She did rescheduled for 6/ 21/21.

## 2019-07-16 NOTE — Telephone Encounter (Signed)
Can we change her antibiotic or does she ntbs again? No appts left for today.

## 2019-07-16 NOTE — Telephone Encounter (Signed)
Why doe he need antibiotic chnaged

## 2019-07-17 ENCOUNTER — Ambulatory Visit (INDEPENDENT_AMBULATORY_CARE_PROVIDER_SITE_OTHER): Payer: BC Managed Care – PPO | Admitting: Nurse Practitioner

## 2019-07-17 ENCOUNTER — Encounter: Payer: Self-pay | Admitting: Family Medicine

## 2019-07-17 DIAGNOSIS — J40 Bronchitis, not specified as acute or chronic: Secondary | ICD-10-CM

## 2019-07-17 MED ORDER — PREDNISONE 20 MG PO TABS: ORAL_TABLET | ORAL | 0 refills | Status: DC

## 2019-07-17 MED ORDER — AMOXICILLIN-POT CLAVULANATE 875-125 MG PO TABS
1.0000 | ORAL_TABLET | Freq: Two times a day (BID) | ORAL | 0 refills | Status: DC
Start: 2019-07-17 — End: 2019-08-07

## 2019-07-17 NOTE — Progress Notes (Signed)
Virtual Visit via telephone Note Due to COVID-19 pandemic this visit was conducted virtually. This visit type was conducted due to national recommendations for restrictions regarding the COVID-19 Pandemic (e.g. social distancing, sheltering in place) in an effort to limit this patient's exposure and mitigate transmission in our community. All issues noted in this document were discussed and addressed.  A physical exam was not performed with this format.  I connected with Whitney Ross on 07/17/19 at 3:20 by telephone and verified that I am speaking with the correct person using two identifiers. Whitney Ross is currently located at home and no one is currently with  her during visit. The provider, Mary-Margaret Hassell Done, FNP is located in their office at time of visit.  I discussed the limitations, risks, security and privacy concerns of performing an evaluation and management service by telephone and the availability of in person appointments. I also discussed with the patient that there may be a patient responsible charge related to this service. The patient expressed understanding and agreed to proceed.   History and Present Illness:  Patient calls in c/o cough that will not resolve. She has finished a round of keflex and not much better. Whitney Ross has productive cough that is clear to yellowish. He usully take augmentin which help.   Review of Systems  Constitutional: Negative for chills and fever.  HENT: Positive for congestion and sinus pain. Negative for sore throat.   Respiratory: Positive for cough (productive).   Neurological: Negative for dizziness and headaches.  Psychiatric/Behavioral: Negative.   All other systems reviewed and are negative.    Observations/Objective: Alert and oriented- answers all questions appropriately No distress Voice hoarse Dry cough   Assessment and Plan: Whitney Ross in today with chief complaint of Sinusitis   1. Bronchitis 1. Take  meds as prescribed 2. Use a cool mist humidifier especially during the winter months and when heat has been humid. 3. Use saline nose sprays frequently 4. Saline irrigations of the nose can be very helpful if done frequently.  * 4X daily for 1 week*  * Use of a nettie pot can be helpful with this. Follow directions with this* 5. Drink plenty of fluids 6. Keep thermostat turn down low 7.For any cough or congestion  Use plain Mucinex- regular strength or max strength is fine   * Children- consult with Pharmacist for dosing 8. For fever or aces or pains- take tylenol or ibuprofen appropriate for age and weight.  * for fevers greater than 101 orally you may alternate ibuprofen and tylenol every  3 hours.   Meds ordered this encounter  Medications  . amoxicillin-clavulanate (AUGMENTIN) 875-125 MG tablet    Sig: Take 1 tablet by mouth 2 (two) times daily.    Dispense:  14 tablet    Refill:  0    Order Specific Question:   Supervising Provider    Answer:   Caryl Pina A [id number]  . predniSONE (DELTASONE) 20 MG tablet    Sig: 2 po at sametime daily for 5 days    Dispense:  10 tablet    Refill:  0    Order Specific Question:   Supervising Provider    Answer:   Caryl Pina A [id number]       Follow Up Instructions: prn    I discussed the assessment and treatment plan with the patient. The patient was provided an opportunity to ask questions and all were answered. The patient agreed with the plan and  demonstrated an understanding of the instructions.   The patient was advised to call back or seek an in-person evaluation if the symptoms worsen or if the condition fails to improve as anticipated.  The above assessment and management plan was discussed with the patient. The patient verbalized understanding of and has agreed to the management plan. Patient is aware to call the clinic if symptoms persist or worsen. Patient is aware when to return to the clinic for a follow-up  visit. Patient educated on when it is appropriate to go to the emergency department.   Time call ended:  3:35  I provided 15 minutes of non-face-to-face time during this encounter.    Mary-Margaret Hassell Done, FNP

## 2019-07-17 NOTE — Telephone Encounter (Signed)
Okay, thanks  No charge 

## 2019-07-18 ENCOUNTER — Encounter: Payer: BC Managed Care – PPO | Admitting: Gastroenterology

## 2019-07-20 NOTE — Telephone Encounter (Signed)
Patient seen by virtual visit on 07/17/2019.

## 2019-07-30 ENCOUNTER — Other Ambulatory Visit: Payer: Self-pay | Admitting: Family Medicine

## 2019-07-30 DIAGNOSIS — F419 Anxiety disorder, unspecified: Secondary | ICD-10-CM

## 2019-08-07 ENCOUNTER — Ambulatory Visit (INDEPENDENT_AMBULATORY_CARE_PROVIDER_SITE_OTHER): Payer: BC Managed Care – PPO | Admitting: Family Medicine

## 2019-08-07 ENCOUNTER — Other Ambulatory Visit: Payer: Self-pay

## 2019-08-07 VITALS — BP 131/80 | HR 64 | Temp 96.9°F | Ht 64.0 in | Wt 184.0 lb

## 2019-08-07 DIAGNOSIS — F418 Other specified anxiety disorders: Secondary | ICD-10-CM | POA: Diagnosis not present

## 2019-08-07 DIAGNOSIS — E7800 Pure hypercholesterolemia, unspecified: Secondary | ICD-10-CM

## 2019-08-07 DIAGNOSIS — M7122 Synovial cyst of popliteal space [Baker], left knee: Secondary | ICD-10-CM

## 2019-08-07 DIAGNOSIS — Z79899 Other long term (current) drug therapy: Secondary | ICD-10-CM | POA: Diagnosis not present

## 2019-08-07 DIAGNOSIS — I10 Essential (primary) hypertension: Secondary | ICD-10-CM | POA: Diagnosis not present

## 2019-08-07 DIAGNOSIS — Z7689 Persons encountering health services in other specified circumstances: Secondary | ICD-10-CM

## 2019-08-07 MED ORDER — DIAZEPAM 5 MG PO TABS: 2.5000 mg | ORAL_TABLET | Freq: Every day | ORAL | 5 refills | Status: DC | PRN

## 2019-08-07 MED ORDER — IBUPROFEN 800 MG PO TABS: 800.0000 mg | ORAL_TABLET | Freq: Three times a day (TID) | ORAL | 2 refills | Status: DC | PRN

## 2019-08-07 NOTE — Patient Instructions (Signed)
You do not need a new referral to see Dr Maureen Ralphs here.   Call the [place] office at [phone]) and tell them you want to be put on the next appt HERE in [facility].   Baker Cyst  A Baker cyst, also called a popliteal cyst, is a growth that forms at the back of the knee. The cyst forms when the fluid-filled sac (bursa) that cushions the knee joint becomes enlarged. What are the causes? In most cases, a Baker cyst results from another knee problem that causes swelling inside the knee. This makes the fluid inside the knee joint (synovial fluid) flow into the bursa behind the knee, causing the bursa to enlarge. What increases the risk? You may be more likely to develop a Baker cyst if you already have a knee problem, such as:  A tear in cartilage that cushions the knee joint (meniscal tear).  A tear in the tissues that connect the bones of the knee joint (ligament tear).  Knee swelling from osteoarthritis, rheumatoid arthritis, or gout. What are the signs or symptoms? The main symptom of this condition is a lump behind the knee. This may be the only symptom of the condition. The lump may be painful, especially when the knee is straightened. If the lump is painful, the pain may come and go. The knee may also be stiff. Symptoms may quickly get more severe if the cyst breaks open (ruptures). If the cyst ruptures, you may feel the following in your knee and calf:  Sudden or worsening pain.  Swelling.  Bruising.  Redness in the calf. A Baker cyst does not always cause symptoms. How is this diagnosed? This condition may be diagnosed based on your symptoms and medical history. Your health care provider will also do a physical exam. This may include:  Feeling the cyst to check whether it is tender.  Checking your knee for signs of another knee condition that causes swelling. You may have imaging tests, such as:  X-rays.  MRI.  Ultrasound. How is this treated? A Baker cyst that is not  painful may go away without treatment. If the cyst gets large or painful, it will likely get better if the underlying knee problem is treated. If needed, treatment for a Baker cyst may include:  Resting.  Keeping weight off of the knee. This means not leaning on the knee to support your body weight.  Taking NSAIDs, such as ibuprofen, to reduce pain and swelling.  Having a procedure to drain the fluid from the cyst with a needle (aspiration). You may also get an injection of a medicine that reduces swelling (steroid).  Having surgery. This may be needed if other treatments do not work. This usually involves correcting knee damage and removing the cyst. Follow these instructions at home:  Activity  Rest as told by your health care provider.  Avoid activities that make pain or swelling worse.  Return to your normal activities as told by your health care provider. Ask your health care provider what activities are safe for you.  Do not use the injured limb to support your body weight until your health care provider says that you can. Use crutches as told by your health care provider. General instructions  Take over-the-counter and prescription medicines only as told by your health care provider.  Keep all follow-up visits as told by your health care provider. This is important. Contact a health care provider if:  You have knee pain, stiffness, or swelling that does not  get better. Get help right away if:  You have sudden or worsening pain and swelling in your calf area. Summary  A Baker cyst, also called a popliteal cyst, is a growth that forms at the back of the knee.  In most cases, a Baker cyst results from another knee problem that causes swelling inside the knee.  A Baker cyst that is not painful may go away without treatment.  If needed, treatment for a Baker cyst may include resting, keeping weight off of the knee, medicines, or draining fluid from the cyst.  Surgery may  be needed if other treatments are not effective. This information is not intended to replace advice given to you by your health care provider. Make sure you discuss any questions you have with your health care provider. Document Revised: 06/23/2018 Document Reviewed: 06/23/2018 Elsevier Patient Education  [place].

## 2019-08-07 NOTE — Progress Notes (Signed)
Subjective: CC: est care, GAD PCP: Janora Norlander, DO GGE:ZMOQH L Fakhouri is a 64 y.o. female presenting to clinic today for:  1. GAD Patient reports situational anxiety that is precipitated by her spouse and sometimes her job.  She typically only uses the Valium at bedtime and only about 3 or 4 times a month but sometimes she will use it for a couple of days consecutively.  Denies any excessive daytime sleepiness, falls, change in mentation or respiratory depression.  2.  Hypertension with hyperlipidemia/ knee pain Patient reports compliance with lisinopril-hydrochlorothiazide and Pravachol.  No chest pain, shortness of breath.  She is tolerating physical activity without difficulty except for when her left knee bothers her.  She does note that she has some intermittent swelling behind the left knee that does become tender.  She has a history of left knee surgery with Dr. Wynelle Link about a year ago.  She is using ibuprofen which does help.  She is not currently using a sleeve or any compression.  Sometimes the knee feels unstable but she never falls.   ROS: Per HPI  Allergies  Allergen Reactions  . Cortisporin [Bacitra-Neomycin-Polymyxin-Hc]    Past Medical History:  Diagnosis Date  . Chronic kidney disease    hx muscle stones  . Diverticulitis   . Elevated cholesterol   . Hypertension   . Lumbago   . Neck pain   . Nephrolithiasis     Current Outpatient Medications:  .  albuterol (VENTOLIN HFA) 108 (90 Base) MCG/ACT inhaler, INHALE TWO PUFFS INTO THE LUNGS EVERY 6 HOURS AS NEEDED FOR WHEEZING OR SHORTNESS OF BREATH, Disp: 8.5 g, Rfl: 2 .  amoxicillin-clavulanate (AUGMENTIN) 875-125 MG tablet, Take 1 tablet by mouth 2 (two) times daily., Disp: 14 tablet, Rfl: 0 .  cephALEXin (KEFLEX) 500 MG capsule, Take 1 capsule (500 mg total) by mouth 2 (two) times daily., Disp: 14 capsule, Rfl: 0 .  cetirizine (ZYRTEC) 10 MG tablet, Take 1 tablet (10 mg total) by mouth daily., Disp: 30  tablet, Rfl: 4 .  diazepam (VALIUM) 5 MG tablet, Take 1 tablet (5 mg total) by mouth every 12 (twelve) hours as needed for anxiety., Disp: 45 tablet, Rfl: 5 .  fluconazole (DIFLUCAN) 150 MG tablet, 1 po q week x 4 weeks (Patient not taking: Reported on 06/26/2019), Disp: 4 tablet, Rfl: 0 .  fluticasone (FLONASE) 50 MCG/ACT nasal spray, Place 2 sprays into both nostrils daily., Disp: 16 g, Rfl: 6 .  ibuprofen (ADVIL) 800 MG tablet, Take 1 tablet (800 mg total) by mouth every 8 (eight) hours as needed for moderate pain., Disp: 60 tablet, Rfl: 2 .  lisinopril-hydrochlorothiazide (ZESTORETIC) 20-12.5 MG tablet, Take 1 tablet by mouth daily., Disp: 30 tablet, Rfl: 4 .  Na Sulfate-K Sulfate-Mg Sulf (SUPREP BOWEL PREP KIT) 17.5-3.13-1.6 GM/177ML SOLN, Take 1 kit by mouth as directed., Disp: 324 mL, Rfl: 0 .  pantoprazole (PROTONIX) 40 MG tablet, Take 1 tablet (40 mg total) by mouth daily., Disp: 30 tablet, Rfl: 4 .  pravastatin (PRAVACHOL) 20 MG tablet, Take 1 tablet (20 mg total) by mouth daily. (Needs to be seen before next refill), Disp: 30 tablet, Rfl: 5 .  predniSONE (DELTASONE) 20 MG tablet, 2 po at sametime daily for 5 days, Disp: 10 tablet, Rfl: 0 .  Prenatal Vit-DSS-Fe Cbn-FA (PRENATAL AD PO), Take 1 Dose by mouth daily. For low iron levels, Disp: , Rfl:  .  rOPINIRole (REQUIP) 1 MG tablet, Take 0.5 tablets (0.5 mg total)  by mouth at bedtime., Disp: 45 tablet, Rfl: 0 .  Sod Picosulfate-Mag Ox-Cit Acd (CLENPIQ) 10-3.5-12 MG-GM -GM/160ML SOLN, Take 1 kit by mouth as directed., Disp: 320 mL, Rfl: 0 Social History   Socioeconomic History  . Marital status: Married    Spouse name: Not on file  . Number of children: 1  . Years of education: Not on file  . Highest education level: Not on file  Occupational History  . Occupation: Restaurant manager, fast food: [employer]  Tobacco Use  . Smoking status: Current Every Day Smoker    Packs/day: 1.00  . Smokeless tobacco: Never Used  Vaping Use  .  Vaping Use: Never used  Substance and Sexual Activity  . Alcohol use: Yes    Comment: RARELY  . Drug use: No  . Sexual activity: Not Currently  Other Topics Concern  . Not on file  Social History Narrative  . Not on file   Social Determinants of Health   Financial Resource Strain:   . Difficulty of Paying Living Expenses:   Food Insecurity:   . Worried About Charity fundraiser in the Last Year:   . Arboriculturist in the Last Year:   Transportation Needs:   . Film/video editor (Medical):   Marland Kitchen Lack of Transportation (Non-Medical):   Physical Activity:   . Days of Exercise per Week:   . Minutes of Exercise per Session:   Stress:   . Feeling of Stress :   Social Connections:   . Frequency of Communication with Friends and Family:   . Frequency of Social Gatherings with Friends and Family:   . Attends Religious Services:   . Active Member of Clubs or Organizations:   . Attends Archivist Meetings:   Marland Kitchen Marital Status:   Intimate Partner Violence:   . Fear of Current or Ex-Partner:   . Emotionally Abused:   Marland Kitchen Physically Abused:   . Sexually Abused:    Family History  Problem Relation Age of Onset  . Heart failure Mother   . Melanoma Father   . Hypertension Sister   . Hyperlipidemia Sister   . Hyperlipidemia Brother   . Hypertension Brother   . Colon cancer Neg Hx   . Esophageal cancer Neg Hx   . Stomach cancer Neg Hx   . Rectal cancer Neg Hx     Objective: Office vital signs reviewed. BP 131/80   Pulse 64   Temp (!) 96.9 F (36.1 C) (Temporal)   Ht _0  (1.626 m)   Wt 184 lb (83.5 kg)   SpO2 96%   BMI 31.58 kg/m   Physical Examination:  General: Awake, alert, well nourished, No acute distress HEENT: Normal, sclera white, MMM Cardio: regular rate and rhythm, S1S2 heard, no murmurs appreciated Pulm: clear to auscultation bilaterally, no wheezes, rhonchi or rales; normal work of breathing on room air Extremities: warm, well perfused, No  edema, cyanosis or clubbing; +2 pulses bilaterally MSK: slightly antalgic gait and normal station  Left knee: Somewhat limited in flexion.  No gross soft tissue swelling noted but she does have a palpable Baker's cyst noted.  Assessment/ Plan: 64 y.o. female   1. Situational anxiety The national narcotic database was reviewed and there were no red flags.  I updated her medication to reflect current use.  This has been sent to her pharmacy.  Controlled substance contract in place.  UDS was reviewed by myself and was appropriate given the  fact that she was tapered off of the Xanax and had metabolites of the Valium, which indicated as needed use which she described today. - diazepam (VALIUM) 5 MG tablet; Take 0.5-1 tablets (2.5-5 mg total) by mouth daily as needed for anxiety.  Dispense: 30 tablet; Refill: 5  2. Chronic use of benzodiazepine for therapeutic purpose  3. Controlled substance agreement signed  4. Essential hypertension Controlled  5. Pure hypercholesterolemia Continue current regimen  6. Establishing care with new doctor, encounter for  7. Synovial cyst of left popliteal space Discussed following up with Dr. Wynelle Link if symptoms do not improve with soft bracing.  She will try and get appointment here in [place].  Phone number provided   No orders of the defined types were placed in this encounter.  No orders of the defined types were placed in this encounter.    Janora Norlander, DO [place] [phone]

## 2019-08-09 ENCOUNTER — Ambulatory Visit (INDEPENDENT_AMBULATORY_CARE_PROVIDER_SITE_OTHER): Payer: BC Managed Care – PPO

## 2019-08-09 ENCOUNTER — Other Ambulatory Visit: Payer: Self-pay | Admitting: Gastroenterology

## 2019-08-09 DIAGNOSIS — Z1159 Encounter for screening for other viral diseases: Secondary | ICD-10-CM

## 2019-08-10 LAB — SARS CORONAVIRUS 2 (TAT 6-24 HRS): SARS Coronavirus 2: NEGATIVE

## 2019-08-13 ENCOUNTER — Ambulatory Visit (AMBULATORY_SURGERY_CENTER): Payer: BC Managed Care – PPO | Admitting: Gastroenterology

## 2019-08-13 ENCOUNTER — Other Ambulatory Visit: Payer: Self-pay

## 2019-08-13 ENCOUNTER — Encounter: Payer: Self-pay | Admitting: Gastroenterology

## 2019-08-13 VITALS — BP 142/86 | HR 59 | Temp 96.9°F | Resp 19 | Ht 64.0 in | Wt 185.0 lb

## 2019-08-13 DIAGNOSIS — K5730 Diverticulosis of large intestine without perforation or abscess without bleeding: Secondary | ICD-10-CM | POA: Diagnosis not present

## 2019-08-13 DIAGNOSIS — D125 Benign neoplasm of sigmoid colon: Secondary | ICD-10-CM

## 2019-08-13 DIAGNOSIS — Z8719 Personal history of other diseases of the digestive system: Secondary | ICD-10-CM | POA: Diagnosis present

## 2019-08-13 MED ORDER — SODIUM CHLORIDE 0.9 % IV SOLN: 500.0000 mL | Freq: Once | INTRAVENOUS | Status: DC

## 2019-08-13 NOTE — Op Note (Signed)
[place] Patient Name: Whitney Ross Procedure Date: 08/13/2019 2:22 PM MRN: [id number] Endoscopist: Milus Banister , MD Age: 64 Referring MD:  Date of Birth: February 17, 1956 Gender: Female Account #: [id number] Procedure:                Colonoscopy Indications:              CT 2021 showed likely sigmoid diverticulitis, last                            colonoscopy 8 years ago Medicines:                Monitored Anesthesia Care Procedure:                Pre-Anesthesia Assessment:                           - Prior to the procedure, a History and Physical                            was performed, and patient medications and                            allergies were reviewed. The patient's tolerance of                            previous anesthesia was also reviewed. The risks                            and benefits of the procedure and the sedation                            options and risks were discussed with the patient.                            All questions were answered, and informed consent                            was obtained. Prior Anticoagulants: The patient has                            taken no previous anticoagulant or antiplatelet                            agents. ASA Grade Assessment: II - A patient with                            mild systemic disease. After reviewing the risks                            and benefits, the patient was deemed in                            satisfactory condition to undergo the procedure.  After obtaining informed consent, the colonoscope                            was passed under direct vision. Throughout the                            procedure, the patient's blood pressure, pulse, and                            oxygen saturations were monitored continuously. The                            Colonoscope was introduced through the anus and                            advanced to the the cecum,  identified by                            appendiceal orifice and ileocecal valve. The                            colonoscopy was performed without difficulty. The                            patient tolerated the procedure well. The quality                            of the bowel preparation was good. The ileocecal                            valve, appendiceal orifice, and rectum were                            photographed. Scope In: 2:25:15 PM Scope Out: 2:38:59 PM Scope Withdrawal Time: 0 hours 7 minutes 15 seconds  Total Procedure Duration: 0 hours 13 minutes 44 seconds  Findings:                 A 3 mm polyp was found in the sigmoid colon. The                            polyp was sessile. The polyp was removed with a                            cold snare. Resection and retrieval were complete.                           Multiple small and large-mouthed diverticula were                            found in the left colon. There was narrowing of the                            colon in association with the diverticular  opening.                            There was evidence of diverticular spasm.                           The exam was otherwise without abnormality on                            direct and retroflexion views. Complications:            No immediate complications. Estimated blood loss:                            None. Estimated Blood Loss:     Estimated blood loss: none. Impression:               - One 3 mm polyp in the sigmoid colon, removed with                            a cold snare. Resected and retrieved.                           - Diverticulosis in the left colon. There was                            narrowing of the colon in association with the                            diverticular opening. There was evidence of                            diverticular spasm.                           - The examination was otherwise normal on direct                            and  retroflexion views. Recommendation:           - Patient has a contact number available for                            emergencies. The signs and symptoms of potential                            delayed complications were discussed with the                            patient. Return to normal activities tomorrow.                            Written discharge instructions were provided to the                            patient.                           -  Resume previous diet.                           - Continue present medications.                           - Await pathology results. Milus Banister, MD 08/13/2019 2:41:44 PM This report has been signed electronically.

## 2019-08-13 NOTE — Patient Instructions (Signed)
HANDOUTS PROVIDED ON: POLYPS & DIVERTICULOSIS  The polyp removed today have been sent for pathology.  The results can take 1-3 weeks to receive.  When your next colonoscopy should occur will be based on the pathology results.    You may resume your previous diet and medication schedule.  Thank you for allowing us to care for you today!!!   YOU HAD AN ENDOSCOPIC PROCEDURE TODAY AT THE [facility]:   Refer to the procedure report that was given to you for any specific questions about what was found during the examination.  If the procedure report does not answer your questions, please call your gastroenterologist to clarify.  If you requested that your care partner not be given the details of your procedure findings, then the procedure report has been included in a sealed envelope for you to review at your convenience later.  YOU SHOULD EXPECT: Some feelings of bloating in the abdomen. Passage of more gas than usual.  Walking can help get rid of the air that was put into your GI tract during the procedure and reduce the bloating. If you had a lower endoscopy (such as a colonoscopy or flexible sigmoidoscopy) you may notice spotting of blood in your stool or on the toilet paper. If you underwent a bowel prep for your procedure, you may not have a normal bowel movement for a few days.  Please Note:  You might notice some irritation and congestion in your nose or some drainage.  This is from the oxygen used during your procedure.  There is no need for concern and it should clear up in a day or so.  SYMPTOMS TO REPORT IMMEDIATELY:   Following lower endoscopy (colonoscopy or flexible sigmoidoscopy):  Excessive amounts of blood in the stool  Significant tenderness or worsening of abdominal pains  Swelling of the abdomen that is new, acute  Fever of 100F or higher  For urgent or emergent issues, a gastroenterologist can be reached at any hour by calling [phone]. Do not use MyChart  messaging for urgent concerns.    DIET:  We do recommend a small meal at first, but then you may proceed to your regular diet.  Drink plenty of fluids but you should avoid alcoholic beverages for 24 hours.  ACTIVITY:  You should plan to take it easy for the rest of today and you should NOT DRIVE or use heavy machinery until tomorrow (because of the sedation medicines used during the test).    FOLLOW UP: Our staff will call the number listed on your records 48-72 hours following your procedure to check on you and address any questions or concerns that you may have regarding the information given to you following your procedure. If we do not reach you, we will leave a message.  We will attempt to reach you two times.  During this call, we will ask if you have developed any symptoms of COVID 19. If you develop any symptoms (ie: fever, flu-like symptoms, shortness of breath, cough etc.) before then, please call [phone].  If you test positive for Covid 19 in the 2 weeks post procedure, please call and report this information to us.    If any biopsies were taken you will be contacted by phone or by letter within the next 1-3 weeks.  Please call us at [phone] if you have not heard about the biopsies in 3 weeks.    SIGNATURES/CONFIDENTIALITY: You and/or your care partner have signed paperwork which will be   entered into your electronic medical record.  These signatures attest to the fact that that the information above on your After Visit Summary has been reviewed and is understood.  Full responsibility of the confidentiality of this discharge information lies with you and/or your care-partner. 

## 2019-08-13 NOTE — Progress Notes (Signed)
Report to PACU, RN, vss, BBS= Clear.  

## 2019-08-13 NOTE — Progress Notes (Signed)
Called to room to assist during endoscopic procedure.  Patient ID and intended procedure confirmed with present staff. Received instructions for my participation in the procedure from the performing physician.  

## 2019-08-14 ENCOUNTER — Ambulatory Visit: Payer: Self-pay | Admitting: Neurology

## 2019-08-15 ENCOUNTER — Telehealth: Payer: Self-pay

## 2019-08-15 NOTE — Telephone Encounter (Signed)
°  Follow up Call-  Call back number 08/13/2019  Post procedure Call Back phone  # [phone]  Permission to leave phone message Yes  Some recent data might be hidden     Patient questions:  Do you have a fever, pain , or abdominal swelling? No. Pain Score  0 *  Have you tolerated food without any problems? Yes.    Have you been able to return to your normal activities? Yes.    Do you have any questions about your discharge instructions: Diet   No. Medications  No. Follow up visit  No.  Do you have questions or concerns about your Care? No.  Actions: * If pain score is 4 or above: No action needed, pain <4.  1. Have you developed a fever since your procedure? no  2.   Have you had an respiratory symptoms (SOB or cough) since your procedure? no  3.   Have you tested positive for COVID 19 since your procedure no  4.   Have you had any family members/close contacts diagnosed with the COVID 19 since your procedure?  no   If yes to any of these questions please route to Joylene John, RN and Erenest Rasher, RN

## 2019-08-19 ENCOUNTER — Other Ambulatory Visit: Payer: Self-pay | Admitting: Family Medicine

## 2019-08-20 ENCOUNTER — Encounter: Payer: Self-pay | Admitting: Gastroenterology

## 2019-10-01 ENCOUNTER — Other Ambulatory Visit: Payer: Self-pay | Admitting: Family Medicine

## 2019-10-01 DIAGNOSIS — J0100 Acute maxillary sinusitis, unspecified: Secondary | ICD-10-CM

## 2019-10-23 ENCOUNTER — Other Ambulatory Visit: Payer: Self-pay | Admitting: Family Medicine

## 2019-11-07 ENCOUNTER — Telehealth (INDEPENDENT_AMBULATORY_CARE_PROVIDER_SITE_OTHER): Payer: BC Managed Care – PPO | Admitting: Family Medicine

## 2019-11-07 ENCOUNTER — Encounter: Payer: Self-pay | Admitting: Family Medicine

## 2019-11-07 DIAGNOSIS — K5792 Diverticulitis of intestine, part unspecified, without perforation or abscess without bleeding: Secondary | ICD-10-CM

## 2019-11-07 MED ORDER — AMOXICILLIN-POT CLAVULANATE 875-125 MG PO TABS: 1.0000 | ORAL_TABLET | Freq: Two times a day (BID) | ORAL | 0 refills | Status: AC

## 2019-11-07 NOTE — Progress Notes (Signed)
Virtual Visit via Video note  I connected with [place] on 11/07/19 at 5:01 PM by video and verified that I am speaking with the correct person using two identifiers. Whitney Ross is currently located at [facility] and nobody is currently with her during visit. The provider, Loman Brooklyn, FNP is located in their office at time of visit.  I discussed the limitations, risks, security and privacy concerns of performing an evaluation and management service by video and the availability of in person appointments. I also discussed with the patient that there may be a patient responsible charge related to this service. The patient expressed understanding and agreed to proceed.  Subjective: PCP: Janora Norlander, DO  Chief Complaint  Patient presents with  . Diverticulitis   Patient reports left lower quadrant abdominal pain times accompanied by nausea.  She has a history of diverticulitis and states this is how her flares always start.  She is hoping to get an antibiotic before it gets worse, especially because she is currently out of town.   ROS: Per HPI  Current Outpatient Medications:  .  albuterol (VENTOLIN HFA) 108 (90 Base) MCG/ACT inhaler, INHALE TWO PUFFS INTO THE LUNGS EVERY 6 HOURS AS NEEDED FOR WHEEZING OR SHORTNESS OF BREATH, Disp: 8.5 g, Rfl: 2 .  cetirizine (ZYRTEC) 10 MG tablet, Take 1 tablet (10 mg total) by mouth daily., Disp: 30 tablet, Rfl: 4 .  diazepam (VALIUM) 5 MG tablet, Take 0.5-1 tablets (2.5-5 mg total) by mouth daily as needed for anxiety., Disp: 30 tablet, Rfl: 5 .  fluticasone (FLONASE) 50 MCG/ACT nasal spray, PLACE TWO SPRAYS INTO BOTH NOSTRILS DAILY, Disp: 16 g, Rfl: 5 .  ibuprofen (ADVIL) 800 MG tablet, Take 1 tablet (800 mg total) by mouth every 8 (eight) hours as needed for moderate pain., Disp: 60 tablet, Rfl: 0 .  lisinopril-hydrochlorothiazide (ZESTORETIC) 20-12.5 MG tablet, Take 1 tablet by mouth daily., Disp: 30 tablet, Rfl: 4 .   pantoprazole (PROTONIX) 40 MG tablet, Take 1 tablet (40 mg total) by mouth daily., Disp: 30 tablet, Rfl: 4 .  pravastatin (PRAVACHOL) 20 MG tablet, Take 1 tablet (20 mg total) by mouth daily. (Needs to be seen before next refill), Disp: 30 tablet, Rfl: 5 .  Prenatal Vit-DSS-Fe Cbn-FA (PRENATAL AD PO), Take 1 Dose by mouth daily. For low iron levels (Patient not taking: Reported on 08/07/2019), Disp: , Rfl:  .  rOPINIRole (REQUIP) 1 MG tablet, Take 0.5 tablets (0.5 mg total) by mouth at bedtime., Disp: 45 tablet, Rfl: 0  Allergies  Allergen Reactions  . Cortisporin [Bacitra-Neomycin-Polymyxin-Hc]    Past Medical History:  Diagnosis Date  . Chronic kidney disease    hx muscle stones  . Diverticulitis   . Elevated cholesterol   . GERD (gastroesophageal reflux disease)   . Heart murmur   . Hypertension   . Lumbago   . Neck pain   . Nephrolithiasis     Observations/Objective: Physical Exam Constitutional:      General: She is not in acute distress.    Appearance: Normal appearance. She is not ill-appearing or toxic-appearing.  Eyes:     General: No scleral icterus.       Right eye: No discharge.        Left eye: No discharge.     Conjunctiva/sclera: Conjunctivae normal.  Pulmonary:     Effort: Pulmonary effort is normal. No respiratory distress.  Neurological:     Mental Status: She is alert and  oriented to person, place, and time.  Psychiatric:        Mood and Affect: Mood normal.        Behavior: Behavior normal.        Thought Content: Thought content normal.        Judgment: Judgment normal.    Assessment and Plan: 1. Acute diverticulitis - amoxicillin-clavulanate (AUGMENTIN) 875-125 MG tablet; Take 1 tablet by mouth 2 (two) times daily for 7 days.  Dispense: 14 tablet; Refill: 0   Follow Up Instructions:   I discussed the assessment and treatment plan with the patient. The patient was provided an opportunity to ask questions and all were answered. The patient agreed  with the plan and demonstrated an understanding of the instructions.   The patient was advised to call back or seek an in-person evaluation if the symptoms worsen or if the condition fails to improve as anticipated.  The above assessment and management plan was discussed with the patient. The patient verbalized understanding of and has agreed to the management plan. Patient is aware to call the clinic if symptoms persist or worsen. Patient is aware when to return to the clinic for a follow-up visit. Patient educated on when it is appropriate to go to the emergency department.   Time call ended: 5:06 PM  I provided 7 minutes of face-to-face time during this encounter.   Hendricks Limes, MSN, APRN, FNP-C [facility] 11/07/19

## 2019-11-30 ENCOUNTER — Other Ambulatory Visit: Payer: Self-pay | Admitting: Family Medicine

## 2019-11-30 DIAGNOSIS — I10 Essential (primary) hypertension: Secondary | ICD-10-CM

## 2019-11-30 DIAGNOSIS — K219 Gastro-esophageal reflux disease without esophagitis: Secondary | ICD-10-CM

## 2019-12-03 ENCOUNTER — Other Ambulatory Visit: Payer: Self-pay | Admitting: Family Medicine

## 2019-12-03 DIAGNOSIS — E7800 Pure hypercholesterolemia, unspecified: Secondary | ICD-10-CM

## 2020-01-01 ENCOUNTER — Telehealth: Payer: Self-pay

## 2020-01-01 NOTE — Telephone Encounter (Signed)
After multiple faxed attempts to retreive CT abdomen and pelvis records from [facility], a fax was returned to me stating there is no record of visit in March 2021 for this patient.

## 2020-01-07 ENCOUNTER — Other Ambulatory Visit: Payer: Self-pay | Admitting: Family Medicine

## 2020-01-07 DIAGNOSIS — K219 Gastro-esophageal reflux disease without esophagitis: Secondary | ICD-10-CM

## 2020-01-07 DIAGNOSIS — I10 Essential (primary) hypertension: Secondary | ICD-10-CM

## 2020-01-30 ENCOUNTER — Telehealth: Payer: Self-pay | Admitting: Family Medicine

## 2020-01-30 ENCOUNTER — Ambulatory Visit (INDEPENDENT_AMBULATORY_CARE_PROVIDER_SITE_OTHER): Payer: BC Managed Care – PPO | Admitting: Family Medicine

## 2020-01-30 DIAGNOSIS — K5792 Diverticulitis of intestine, part unspecified, without perforation or abscess without bleeding: Secondary | ICD-10-CM | POA: Diagnosis not present

## 2020-01-30 MED ORDER — AMOXICILLIN-POT CLAVULANATE 875-125 MG PO TABS: 1.0000 | ORAL_TABLET | Freq: Two times a day (BID) | ORAL | 0 refills | Status: DC

## 2020-01-30 MED ORDER — FLUCONAZOLE 150 MG PO TABS: 150.0000 mg | ORAL_TABLET | Freq: Once | ORAL | 0 refills | Status: AC

## 2020-01-30 NOTE — Telephone Encounter (Signed)
No appts available today or tomorrow. Wanted [place] to send  message. Please advise

## 2020-01-30 NOTE — Telephone Encounter (Signed)
  Incoming Patient Call  01/30/2020  What symptoms do you have? Pain in stomach, thinks that it is diverticulitis   How long have you been sick? Couple of days  Have you been seen for this problem? Yes but not recently  If your provider decides to give you a prescription, which pharmacy would you like for it to be sent to? [facility] in [place]   Patient informed that this information will be sent to the clinical staff for review and that they should receive a follow up call.

## 2020-01-30 NOTE — Telephone Encounter (Signed)
Appt scheduled and complete

## 2020-01-30 NOTE — Progress Notes (Signed)
Telephone visit  Subjective: CC: diverticulitis? PCP: Janora Norlander, DO Whitney Ross is a 64 y.o. female calls for telephone consult today. Patient provides verbal consent for consult held via phone.  Due to COVID-19 pandemic this visit was conducted virtually. This visit type was conducted due to national recommendations for restrictions regarding the COVID-19 Pandemic (e.g. social distancing, sheltering in place) in an effort to limit this patient's exposure and mitigate transmission in our community. All issues noted in this document were discussed and addressed.  A physical exam was not performed with this format.   Location of patient: car Location of provider: [facility] Others present for call: none  1. Diverticulitis She ate some coconut a few days ago and she started having abdominal pain in the lower abdomen.  This is typical of diverticulitis flares.  She has had loose stools but no blood in stool.  No fever, nausea, vomiting.  Pain is getting worse and she had chills.  ROS: Per HPI  Allergies  Allergen Reactions  . Cortisporin [Bacitra-Neomycin-Polymyxin-Hc]    Past Medical History:  Diagnosis Date  . Chronic kidney disease    hx muscle stones  . Diverticulitis   . Elevated cholesterol   . GERD (gastroesophageal reflux disease)   . Heart murmur   . Hypertension   . Lumbago   . Neck pain   . Nephrolithiasis     Current Outpatient Medications:  .  albuterol (VENTOLIN HFA) 108 (90 Base) MCG/ACT inhaler, INHALE TWO PUFFS INTO THE LUNGS EVERY 6 HOURS AS NEEDED FOR WHEEZING OR SHORTNESS OF BREATH, Disp: 8.5 g, Rfl: 2 .  cetirizine (ZYRTEC) 10 MG tablet, Take 1 tablet (10 mg total) by mouth daily., Disp: 30 tablet, Rfl: 0 .  diazepam (VALIUM) 5 MG tablet, Take 0.5-1 tablets (2.5-5 mg total) by mouth daily as needed for anxiety., Disp: 30 tablet, Rfl: 5 .  fluticasone (FLONASE) 50 MCG/ACT nasal spray, PLACE TWO SPRAYS INTO BOTH NOSTRILS DAILY, Disp: 16 g, Rfl: 5 .   ibuprofen (ADVIL) 800 MG tablet, Take 1 tablet (800 mg total) by mouth every 8 (eight) hours as needed for moderate pain., Disp: 60 tablet, Rfl: 0 .  lisinopril-hydrochlorothiazide (ZESTORETIC) 20-12.5 MG tablet, Take 1 tablet by mouth daily., Disp: 30 tablet, Rfl: 0 .  pantoprazole (PROTONIX) 40 MG tablet, Take 1 tablet (40 mg total) by mouth daily., Disp: 30 tablet, Rfl: 0 .  pravastatin (PRAVACHOL) 20 MG tablet, Take 1 tablet (20 mg total) by mouth daily., Disp: 30 tablet, Rfl: 2 .  Prenatal Vit-DSS-Fe Cbn-FA (PRENATAL AD PO), Take 1 Dose by mouth daily. For low iron levels (Patient not taking: Reported on 08/07/2019), Disp: , Rfl:  .  rOPINIRole (REQUIP) 1 MG tablet, Take 0.5 tablets (0.5 mg total) by mouth at bedtime., Disp: 45 tablet, Rfl: 0  Assessment/ Plan: 64 y.o. female   Diverticulitis - Plan: amoxicillin-clavulanate (AUGMENTIN) 875-125 MG tablet   Start time: 10:38am End time: 10:44am  Total time spent on patient care (including telephone call/ virtual visit): 6 minutes  [place], [place] [phone]

## 2020-01-30 NOTE — Telephone Encounter (Signed)
Double book my 1015am as a video visit for her and please get her arrived ASAP.  I will try and squeeze her in this am.

## 2020-02-04 ENCOUNTER — Other Ambulatory Visit: Payer: Self-pay | Admitting: Family Medicine

## 2020-02-04 DIAGNOSIS — F418 Other specified anxiety disorders: Secondary | ICD-10-CM

## 2020-03-03 ENCOUNTER — Other Ambulatory Visit: Payer: Self-pay | Admitting: Family Medicine

## 2020-03-03 DIAGNOSIS — I10 Essential (primary) hypertension: Secondary | ICD-10-CM

## 2020-03-03 DIAGNOSIS — E7800 Pure hypercholesterolemia, unspecified: Secondary | ICD-10-CM

## 2020-03-14 ENCOUNTER — Encounter: Payer: Self-pay | Admitting: Family Medicine

## 2020-03-14 ENCOUNTER — Ambulatory Visit (INDEPENDENT_AMBULATORY_CARE_PROVIDER_SITE_OTHER): Payer: BC Managed Care – PPO | Admitting: Family Medicine

## 2020-03-14 DIAGNOSIS — J0100 Acute maxillary sinusitis, unspecified: Secondary | ICD-10-CM

## 2020-03-14 MED ORDER — AMOXICILLIN-POT CLAVULANATE 875-125 MG PO TABS: 1.0000 | ORAL_TABLET | Freq: Two times a day (BID) | ORAL | 0 refills | Status: DC

## 2020-03-14 NOTE — Progress Notes (Signed)
Virtual Visit via telephone Note  I connected with [place] on 03/14/20 at 1734 by telephone and verified that I am speaking with the correct person using two identifiers. Whitney Ross is currently located at home and patient are currently with her during visit. The provider, Fransisca Kaufmann Jolan Mealor, MD is located in their office at time of visit.  Call ended at 1739  I discussed the limitations, risks, security and privacy concerns of performing an evaluation and management service by telephone and the availability of in person appointments. I also discussed with the patient that there may be a patient responsible charge related to this service. The patient expressed understanding and agreed to proceed.   History and Present Illness: Patient is calling in for sinus pain above her teeth that has been painful like sinus and low fever of 99.  She denies SOB or wheezing or loss of taste or smell. She denies any sick contacts or covid symptoms. She has been using flonase and allergy pill.  This started yesterday.   No diagnosis found.  Outpatient Encounter Medications as of 03/14/2020  Medication Sig   albuterol (VENTOLIN HFA) 108 (90 Base) MCG/ACT inhaler INHALE TWO PUFFS INTO THE LUNGS EVERY 6 HOURS AS NEEDED FOR WHEEZING OR SHORTNESS OF BREATH   amoxicillin-clavulanate (AUGMENTIN) 875-125 MG tablet Take 1 tablet by mouth 2 (two) times daily.   cetirizine (ZYRTEC) 10 MG tablet Take 1 tablet (10 mg total) by mouth daily.   diazepam (VALIUM) 5 MG tablet Take 0.5-1 tablets (2.5-5 mg total) by mouth daily as needed for anxiety.   fluticasone (FLONASE) 50 MCG/ACT nasal spray PLACE TWO SPRAYS INTO BOTH NOSTRILS DAILY   ibuprofen (ADVIL) 800 MG tablet Take 1 tablet (800 mg total) by mouth every 8 (eight) hours as needed for moderate pain.   lisinopril-hydrochlorothiazide (ZESTORETIC) 20-12.5 MG tablet Take 1 tablet by mouth daily. (Needs to be seen before next refill)   pantoprazole  (PROTONIX) 40 MG tablet Take 1 tablet (40 mg total) by mouth daily.   pravastatin (PRAVACHOL) 20 MG tablet Take 1 tablet (20 mg total) by mouth daily. (Needs to be seen before next refill)   Prenatal Vit-DSS-Fe Cbn-FA (PRENATAL AD PO) Take 1 Dose by mouth daily. For low iron levels (Patient not taking: Reported on 08/07/2019)   rOPINIRole (REQUIP) 1 MG tablet Take 0.5 tablets (0.5 mg total) by mouth at bedtime.   No facility-administered encounter medications on file as of 03/14/2020.    Review of Systems  Constitutional: Positive for fever. Negative for chills.  HENT: Positive for congestion, postnasal drip, sinus pressure, sneezing and sore throat. Negative for ear discharge, ear pain and rhinorrhea.   Eyes: Negative for pain, redness and visual disturbance.  Respiratory: Positive for cough. Negative for chest tightness and shortness of breath.   Cardiovascular: Negative for chest pain and leg swelling.  Genitourinary: Negative for difficulty urinating and dysuria.  Musculoskeletal: Negative for back pain and gait problem.  Skin: Negative for rash.  Neurological: Negative for light-headedness and headaches.  Psychiatric/Behavioral: Negative for agitation and behavioral problems.  All other systems reviewed and are negative.   Observations/Objective: Patient sounds comfortable and in no acute distress  Assessment and Plan: Problem List Items Addressed This Visit      Respiratory   Sinusitis, acute - Primary   Relevant Medications   amoxicillin-clavulanate (AUGMENTIN) 875-125 MG tablet      Will keep flonase and allergy medicine and mucinex augmentin Follow up plan: Return if  symptoms worsen or fail to improve.     I discussed the assessment and treatment plan with the patient. The patient was provided an opportunity to ask questions and all were answered. The patient agreed with the plan and demonstrated an understanding of the instructions.   The patient was advised to  call back or seek an in-person evaluation if the symptoms worsen or if the condition fails to improve as anticipated.  The above assessment and management plan was discussed with the patient. The patient verbalized understanding of and has agreed to the management plan. Patient is aware to call the clinic if symptoms persist or worsen. Patient is aware when to return to the clinic for a follow-up visit. Patient educated on when it is appropriate to go to the emergency department.    I provided 5 minutes of non-face-to-face time during this encounter.    Worthy Rancher, MD

## 2020-03-31 ENCOUNTER — Telehealth: Payer: Self-pay

## 2020-03-31 ENCOUNTER — Other Ambulatory Visit: Payer: Self-pay | Admitting: Family Medicine

## 2020-03-31 DIAGNOSIS — E7800 Pure hypercholesterolemia, unspecified: Secondary | ICD-10-CM

## 2020-03-31 MED ORDER — PRAVASTATIN SODIUM 20 MG PO TABS: 20.0000 mg | ORAL_TABLET | Freq: Every day | ORAL | 1 refills | Status: DC

## 2020-03-31 NOTE — Telephone Encounter (Signed)
[place]. NTBS 30 days given 03/03/20 

## 2020-03-31 NOTE — Telephone Encounter (Signed)
Sent in for pt  

## 2020-03-31 NOTE — Telephone Encounter (Signed)
  Prescription Request  03/31/2020  What is the name of the medication or equipment? pravastatin (PRAVACHOL) 20 MG tablet    Have you contacted your pharmacy to request a refill? (if applicable) Yes  Which pharmacy would you like this sent to? [facility] in [place], pt has appt with Dr. Darnell Level. On 05/13/20 she is about to run out and needs enough to last until appt    Patient notified that their request is being sent to the clinical staff for review and that they should receive a response within 2 business days.

## 2020-04-28 ENCOUNTER — Other Ambulatory Visit: Payer: Self-pay | Admitting: Family Medicine

## 2020-04-28 DIAGNOSIS — E7800 Pure hypercholesterolemia, unspecified: Secondary | ICD-10-CM

## 2020-05-13 ENCOUNTER — Encounter: Payer: Self-pay | Admitting: Family Medicine

## 2020-05-13 ENCOUNTER — Ambulatory Visit: Payer: BC Managed Care – PPO | Admitting: Family Medicine

## 2020-05-13 ENCOUNTER — Other Ambulatory Visit: Payer: Self-pay

## 2020-05-13 VITALS — BP 97/61 | HR 75 | Temp 98.4°F | Resp 20 | Ht 64.0 in | Wt 188.0 lb

## 2020-05-13 DIAGNOSIS — M79672 Pain in left foot: Secondary | ICD-10-CM

## 2020-05-13 DIAGNOSIS — I10 Essential (primary) hypertension: Secondary | ICD-10-CM | POA: Diagnosis not present

## 2020-05-13 DIAGNOSIS — K219 Gastro-esophageal reflux disease without esophagitis: Secondary | ICD-10-CM

## 2020-05-13 DIAGNOSIS — Z79899 Other long term (current) drug therapy: Secondary | ICD-10-CM | POA: Diagnosis not present

## 2020-05-13 DIAGNOSIS — F418 Other specified anxiety disorders: Secondary | ICD-10-CM

## 2020-05-13 DIAGNOSIS — E7800 Pure hypercholesterolemia, unspecified: Secondary | ICD-10-CM | POA: Diagnosis not present

## 2020-05-13 MED ORDER — PRAVASTATIN SODIUM 20 MG PO TABS: 20.0000 mg | ORAL_TABLET | Freq: Every day | ORAL | 3 refills | Status: DC

## 2020-05-13 MED ORDER — LISINOPRIL-HYDROCHLOROTHIAZIDE 20-12.5 MG PO TABS: 1.0000 | ORAL_TABLET | Freq: Every day | ORAL | 3 refills | Status: DC

## 2020-05-13 MED ORDER — DIAZEPAM 5 MG PO TABS
2.5000 mg | ORAL_TABLET | Freq: Every day | ORAL | 5 refills | Status: DC | PRN
Start: 2020-05-13 — End: 2021-04-10

## 2020-05-13 MED ORDER — IBUPROFEN 800 MG PO TABS
800.0000 mg | ORAL_TABLET | Freq: Three times a day (TID) | ORAL | 0 refills | Status: DC | PRN
Start: 2020-05-13 — End: 2021-01-22

## 2020-05-13 MED ORDER — PANTOPRAZOLE SODIUM 40 MG PO TBEC: 40.0000 mg | DELAYED_RELEASE_TABLET | Freq: Every day | ORAL | 3 refills | Status: DC

## 2020-05-13 NOTE — Progress Notes (Signed)
Subjective: CC: HTN, HLD PCP: Janora Norlander, DO JEH:UDJSH Whitney Ross is a 65 y.o. female presenting to clinic today for:  1. HTN, HLD Patient reports intermittent dizziness when bending over.  Not checking BP at home.  Compliant with lisinopril-HCTZ.  No chest pain, shortness of breath or edema.  No visual disturbance.  Compliant with Pravachol  2.  Anxiety disorder/insomnia Patient has situational anxieties or/insomnia.  She uses 1 to 2 tablets of her Valium per month.  Denies any excessive daytime sedation, falls, respiratory depression, visual or auditory hallucinations.  3.  GERD Patient reports good control of GERD with pantoprazole.  No GI bleeding.  No nausea or vomiting.  No abdominal pain.  4.  Foot pain Patient reports left foot pain that has been present for a while now.  She has been seen by her chiropractor in [place] but notes that even after adjustment the symptoms returned.  Symptoms seem to be prominent at nighttime but can occur during the daytime.  Describes it as an aching, occasionally shooting pain of the foot.  She points to the lateral aspect of the foot.  No sensory changes.  Motrin does not relieve.     ROS: Per HPI  Allergies  Allergen Reactions  . Cortisporin [Bacitra-Neomycin-Polymyxin-Hc]    Past Medical History:  Diagnosis Date  . Chronic kidney disease    hx muscle stones  . Diverticulitis   . Elevated cholesterol   . GERD (gastroesophageal reflux disease)   . Heart murmur   . Hypertension   . Lumbago   . Neck pain   . Nephrolithiasis     Current Outpatient Medications:  .  albuterol (VENTOLIN HFA) 108 (90 Base) MCG/ACT inhaler, INHALE TWO PUFFS INTO THE LUNGS EVERY 6 HOURS AS NEEDED FOR WHEEZING OR SHORTNESS OF BREATH, Disp: 8.5 g, Rfl: 2 .  cetirizine (ZYRTEC) 10 MG tablet, Take 1 tablet (10 mg total) by mouth daily., Disp: 30 tablet, Rfl: 0 .  diazepam (VALIUM) 5 MG tablet, Take 0.5-1 tablets (2.5-5 mg total) by mouth daily  as needed for anxiety., Disp: 30 tablet, Rfl: 5 .  fluticasone (FLONASE) 50 MCG/ACT nasal spray, PLACE TWO SPRAYS INTO BOTH NOSTRILS DAILY, Disp: 16 g, Rfl: 5 .  ibuprofen (ADVIL) 800 MG tablet, Take 1 tablet (800 mg total) by mouth every 8 (eight) hours as needed for moderate pain., Disp: 60 tablet, Rfl: 0 .  lisinopril-hydrochlorothiazide (ZESTORETIC) 20-12.5 MG tablet, Take 1 tablet by mouth daily. (Needs to be seen before next refill), Disp: 30 tablet, Rfl: 0 .  pantoprazole (PROTONIX) 40 MG tablet, Take 1 tablet (40 mg total) by mouth daily., Disp: 30 tablet, Rfl: 0 .  pravastatin (PRAVACHOL) 20 MG tablet, Take 1 tablet (20 mg total) by mouth daily. (Needs to be seen before next refill), Disp: 30 tablet, Rfl: 0 .  Prenatal Vit-DSS-Fe Cbn-FA (PRENATAL AD PO), Take 1 Dose by mouth daily. For low iron levels (Patient not taking: Reported on 08/07/2019), Disp: , Rfl:  .  rOPINIRole (REQUIP) 1 MG tablet, Take 0.5 tablets (0.5 mg total) by mouth at bedtime., Disp: 45 tablet, Rfl: 0 Social History   Socioeconomic History  . Marital status: Married    Spouse name: Not on file  . Number of children: 1  . Years of education: Not on file  . Highest education level: Not on file  Occupational History  . Occupation: Restaurant manager, fast food: [employer]  Tobacco Use  . Smoking status: Current Every  Day Smoker    Packs/day: 1.00  . Smokeless tobacco: Never Used  Vaping Use  . Vaping Use: Never used  Substance and Sexual Activity  . Alcohol use: Yes    Comment: RARELY  . Drug use: No  . Sexual activity: Not Currently  Other Topics Concern  . Not on file  Social History Narrative  . Not on file   Social Determinants of Health   Financial Resource Strain: Not on file  Food Insecurity: Not on file  Transportation Needs: Not on file  Physical Activity: Not on file  Stress: Not on file  Social Connections: Not on file  Intimate Partner Violence: Not on file   Family History  Problem  Relation Age of Onset  . Heart failure Mother   . Melanoma Father   . Hypertension Sister   . Hyperlipidemia Sister   . Hyperlipidemia Brother   . Hypertension Brother   . Colon cancer Neg Hx   . Esophageal cancer Neg Hx   . Stomach cancer Neg Hx   . Rectal cancer Neg Hx     Objective: Office vital signs reviewed. BP 97/61   Pulse 75   Temp 98.4 F (36.9 C)   Resp 20   Ht '5\' 4"'  (1.626 m)   Wt 188 lb (85.3 kg)   SpO2 99%   BMI 32.27 kg/m   Physical Examination:  General: Awake, alert, well nourished, No acute distress HEENT: Normal, sclera white, MMM Cardio: regular rate   Pulm:  normal work of breathing on room air Extremities: warm, well perfused, No edema, cyanosis or clubbing; +2 pulses bilaterally MSK: normal gait and station  Left foot: TTP to lateral foot over cuboid Skin: dry; intact; no rashes or lesions  Assessment/ Plan: 65 y.o. female   Essential hypertension - Plan: CMP14+EGFR, DISCONTINUED: lisinopril-hydrochlorothiazide (ZESTORETIC) 20-12.5 MG tablet, CANCELED: CMP14+EGFR  Pure hypercholesterolemia - Plan: pravastatin (PRAVACHOL) 20 MG tablet, Lipid panel, CMP14+EGFR, CANCELED: CMP14+EGFR, CANCELED: LDL Cholesterol, Direct  Situational anxiety - Plan: ToxASSURE Select 13 (MW), Urine, diazepam (VALIUM) 5 MG tablet  Chronic use of benzodiazepine for therapeutic purpose - Plan: ToxASSURE Select 13 (MW), Urine  Controlled substance agreement signed - Plan: ToxASSURE Select 13 (MW), Urine  Left foot pain - Plan: Ambulatory referral to Podiatry  Gastroesophageal reflux disease without esophagitis - Plan: pantoprazole (PROTONIX) 40 MG tablet  Blood pressure low.  Discontinue lisinopril/hydrochlorothiazide.  Continue monitoring blood pressure.  Goal less than 140/90.  Plan for fasting CMP tomorrow  Continue Pravachol.  She will come in for fasting lipid panel tomorrow  UDS and CSC were updated as per office policy.  Valium renewed.  National narcotic  database was reviewed and there were no red flags  Left foot pain possibly arthritic.  Wonder if she would benefit from corticosteroid injection.  Referral to podiatry placed  GERD well controlled.  Protonix renewed  No orders of the defined types were placed in this encounter.  No orders of the defined types were placed in this encounter.    Janora Norlander, DO [place] [phone]

## 2020-05-21 LAB — TOXASSURE SELECT 13 (MW), URINE

## 2020-05-22 ENCOUNTER — Ambulatory Visit: Payer: BC Managed Care – PPO | Admitting: Podiatry

## 2020-05-22 ENCOUNTER — Encounter: Payer: Self-pay | Admitting: Podiatry

## 2020-05-22 ENCOUNTER — Ambulatory Visit (INDEPENDENT_AMBULATORY_CARE_PROVIDER_SITE_OTHER): Payer: BC Managed Care – PPO

## 2020-05-22 ENCOUNTER — Other Ambulatory Visit: Payer: Self-pay

## 2020-05-22 DIAGNOSIS — M79672 Pain in left foot: Secondary | ICD-10-CM | POA: Diagnosis not present

## 2020-05-22 DIAGNOSIS — M7672 Peroneal tendinitis, left leg: Secondary | ICD-10-CM | POA: Diagnosis not present

## 2020-05-22 DIAGNOSIS — M79671 Pain in right foot: Secondary | ICD-10-CM

## 2020-05-22 DIAGNOSIS — Q828 Other specified congenital malformations of skin: Secondary | ICD-10-CM

## 2020-05-22 MED ORDER — TRIAMCINOLONE ACETONIDE 10 MG/ML IJ SUSP
10.0000 mg | Freq: Once | INTRAMUSCULAR | Status: AC
  Administered 2020-05-22: 10 mg

## 2020-05-22 MED ORDER — DICLOFENAC SODIUM 75 MG PO TBEC: 75.0000 mg | DELAYED_RELEASE_TABLET | Freq: Two times a day (BID) | ORAL | 2 refills | Status: DC

## 2020-05-22 NOTE — Progress Notes (Signed)
Subjective:   Patient ID: Whitney Ross, female   DOB: 65 y.o.   MRN: [id number]   HPI Patient presents stating she has a lot of pain in the outside of the left foot and she is getting something on the bottom of her right foot that sore.  States that the left foot is been there for around 6 months and gradually getting worse and patient does smoke 1 pack/day and is not currently active   Review of Systems  All other systems reviewed and are negative.       Objective:  Physical Exam Vitals and nursing note reviewed.  Constitutional:      Appearance: She is well-developed.  Pulmonary:     Effort: Pulmonary effort is normal.  Musculoskeletal:        General: Normal range of motion.  Skin:    General: Skin is warm.  Neurological:     Mental Status: She is alert.     Neurovascular status intact muscle strength adequate range of motion adequate with quite a bit of inflammation pain of the fifth metatarsal base around the tendon complex.  Also has a lesion subsecond third metatarsal right painful when palpated localized to this area and is found to have good digital perfusion well oriented x3     Assessment:  Acute peroneal tendinitis left along with lesion formation right     Plan:  H&P reviewed all conditions and for the left I did sterile prep and injected the tendon complex at insertion 3 mg Dexasone Kenalog 5 mg Xylocaine and I went ahead and debrided lesion right do not recommend further treatment for that but may be necessary educating her on it today.  Applied fascial brace to lift up the lateral foot extended and explained ice and placed on diclofenac 75 mg twice daily reappoint  X-rays indicate no signs of fracture or no signs of bony injury associated with condition

## 2020-06-05 ENCOUNTER — Encounter: Payer: Self-pay | Admitting: Podiatry

## 2020-06-05 ENCOUNTER — Other Ambulatory Visit: Payer: Self-pay

## 2020-06-05 ENCOUNTER — Ambulatory Visit: Payer: BC Managed Care – PPO | Admitting: Podiatry

## 2020-06-05 DIAGNOSIS — M7672 Peroneal tendinitis, left leg: Secondary | ICD-10-CM | POA: Diagnosis not present

## 2020-06-05 DIAGNOSIS — M79672 Pain in left foot: Secondary | ICD-10-CM | POA: Diagnosis not present

## 2020-06-05 DIAGNOSIS — M79671 Pain in right foot: Secondary | ICD-10-CM | POA: Diagnosis not present

## 2020-06-08 NOTE — Progress Notes (Signed)
Subjective:   Patient ID: Whitney Ross, female   DOB: 65 y.o.   MRN: [id number]   HPI Patient states that she is improving somewhat but states that she still has pain and states the boot is helping her more than the brace that she has at home   ROS      Objective:  Physical Exam  Neurovascular status intact muscle strength adequate range of motion adequate with patient noted to have discomfort that has improved in the lateral side of the right foot with inflammation that is better and is no longer as sore as it was.  Patient is found to have good overall digital perfusion      Assessment:  Improvement of peroneal tendinitis right     Plan:  Advised on boot usage with gradual reduction ice therapy and continued supportive shoe gear.  Reappoint to recheck as symptoms indicate

## 2020-07-15 ENCOUNTER — Encounter: Payer: Self-pay | Admitting: Family Medicine

## 2020-07-25 ENCOUNTER — Ambulatory Visit (INDEPENDENT_AMBULATORY_CARE_PROVIDER_SITE_OTHER): Payer: BC Managed Care – PPO | Admitting: Nurse Practitioner

## 2020-07-25 ENCOUNTER — Encounter: Payer: Self-pay | Admitting: Nurse Practitioner

## 2020-07-25 DIAGNOSIS — J0100 Acute maxillary sinusitis, unspecified: Secondary | ICD-10-CM

## 2020-07-25 MED ORDER — AMOXICILLIN-POT CLAVULANATE 875-125 MG PO TABS: 1.0000 | ORAL_TABLET | Freq: Two times a day (BID) | ORAL | 0 refills | Status: DC

## 2020-07-25 NOTE — Progress Notes (Signed)
   Virtual Visit  Note Due to COVID-19 pandemic this visit was conducted virtually. This visit type was conducted due to national recommendations for restrictions regarding the COVID-19 Pandemic (e.g. social distancing, sheltering in place) in an effort to limit this patient's exposure and mitigate transmission in our community. All issues noted in this document were discussed and addressed.  A physical exam was not performed with this format.  I connected with Whitney Ross on 07/25/20 at 9:30 am by telephone and verified that I am speaking with the correct person using two identifiers. Whitney Ross is currently located at home  during visit. The provider, Ivy Lynn, NP is located in their office at time of visit.  I discussed the limitations, risks, security and privacy concerns of performing an evaluation and management service by telephone and the availability of in person appointments. I also discussed with the patient that there may be a patient responsible charge related to this service. The patient expressed understanding and agreed to proceed.   History and Present Illness:  Sinusitis This is a recurrent problem. Episode onset: in the past 4 days. The problem has been gradually worsening since onset. There has been no fever. The pain is moderate. Associated symptoms include congestion, ear pain and sinus pressure. Pertinent negatives include no chills or sore throat. (Jaw pain) Past treatments include nothing. The treatment provided no relief.      Review of Systems  Constitutional: Negative for chills and fever.  HENT: Positive for congestion, ear pain, sinus pressure and sinus pain. Negative for hearing loss, sore throat and tinnitus.   Cardiovascular: Negative.   Skin: Negative for rash.  All other systems reviewed and are negative.    Observations/Objective: Tele-visit  Patient did not sound to be in distress Assessment and Plan: Augmentin 875-125 mg tablet by  mouth, education provided to patient with printed handouts given.  Please take medication as prescribed.  Increase hydration, Tylenol for headache and fever.  Rx sent to pharmacy  Follow Up Instructions: Follow-up with worsening unresolved   I discussed the assessment and treatment plan with the patient. The patient was provided an opportunity to ask questions and all were answered. The patient agreed with the plan and demonstrated an understanding of the instructions.   The patient was advised to call back or seek an in-person evaluation if the symptoms worsen or if the condition fails to improve as anticipated.  The above assessment and management plan was discussed with the patient. The patient verbalized understanding of and has agreed to the management plan. Patient is aware to call the clinic if symptoms persist or worsen. Patient is aware when to return to the clinic for a follow-up visit. Patient educated on when it is appropriate to go to the emergency department.   Time call ended: 9:40 AM  I provided 10 minutes of  non face-to-face time during this encounter.    Ivy Lynn, NP

## 2020-07-25 NOTE — Assessment & Plan Note (Signed)
Worsening symptoms of maxillary sinusitis, patient is reporting ear pain, sinus pain, jaw pain, in the last 4 days.  Augmentin 875-125 mg tablet by mouth twice daily , education provided to patient with printed handouts given.  Please take medication as prescribed.  Increase hydration, Tylenol for headache and fever.  Rx sent to pharmacy

## 2020-07-31 ENCOUNTER — Encounter: Payer: Self-pay | Admitting: Family Medicine

## 2020-08-01 ENCOUNTER — Other Ambulatory Visit: Payer: Self-pay | Admitting: Family Medicine

## 2020-08-01 DIAGNOSIS — I10 Essential (primary) hypertension: Secondary | ICD-10-CM

## 2020-08-01 MED ORDER — LISINOPRIL 10 MG PO TABS: 10.0000 mg | ORAL_TABLET | Freq: Every day | ORAL | 3 refills | Status: DC

## 2020-08-11 ENCOUNTER — Encounter: Payer: Self-pay | Admitting: Family Medicine

## 2020-08-12 ENCOUNTER — Encounter: Payer: Self-pay | Admitting: Family

## 2020-08-12 ENCOUNTER — Ambulatory Visit (INDEPENDENT_AMBULATORY_CARE_PROVIDER_SITE_OTHER): Payer: BC Managed Care – PPO | Admitting: Family

## 2020-08-12 DIAGNOSIS — J019 Acute sinusitis, unspecified: Secondary | ICD-10-CM | POA: Diagnosis not present

## 2020-08-12 MED ORDER — CETIRIZINE HCL 10 MG PO TABS: 10.0000 mg | ORAL_TABLET | Freq: Every day | ORAL | 4 refills | Status: DC

## 2020-08-12 MED ORDER — AMOXICILLIN-POT CLAVULANATE 875-125 MG PO TABS: 1.0000 | ORAL_TABLET | Freq: Two times a day (BID) | ORAL | 0 refills | Status: DC

## 2020-08-12 NOTE — Progress Notes (Signed)
Virtual Visit  Note Due to COVID-19 pandemic this visit was conducted virtually. This visit type was conducted due to national recommendations for restrictions regarding the COVID-19 Pandemic (e.g. social distancing, sheltering in place) in an effort to limit this patient's exposure and mitigate transmission in our community. All issues noted in this document were discussed and addressed.  A physical exam was not performed with this format.  I connected with Rowene L Fairbairn on 08/12/20 at 1:01 pm  by telephone and verified that I am speaking with the correct person using two identifiers. Kerry-Anne L Flye is currently located at work and no one is currently with her during visit. The provider, Evelina Dun, FNP is located in their office at time of visit.  I discussed the limitations, risks, security and privacy concerns of performing an evaluation and management service by telephone and the availability of in person appointments. I also discussed with the patient that there may be a patient responsible charge related to this service. The patient expressed understanding and agreed to proceed.   History and Present Illness:  Pt calls today complaining bilateral ear pain and sinus issues that started 08/10/20.  She was seen on 07/25/20 and given Augmentin, however, she reports she was diagnosed with  COVID on 07/27/20.  Sinusitis This is a recurrent problem. The current episode started in the past 7 days. The problem has been gradually worsening since onset. There has been no fever. Her pain is at a severity of 5/10. The pain is moderate. Associated symptoms include congestion, ear pain and sinus pressure. Pertinent negatives include no coughing, headaches, shortness of breath, sneezing or sore throat. Past treatments include acetaminophen and oral decongestants. The treatment provided mild relief.     Review of Systems  HENT:  Positive for congestion, ear pain and sinus pressure. Negative for  sneezing and sore throat.   Respiratory:  Negative for cough and shortness of breath.   Neurological:  Negative for headaches.    Observations/Objective: No SOB or distress noted, nasal congestion.   Assessment and Plan: 1. Acute sinusitis, recurrence not specified, unspecified location - Take meds as prescribed - Use a cool mist humidifier  -Use saline nose sprays frequently -Force fluids -For any cough or congestion  Use plain Mucinex- regular strength or max strength is fine -For fever or aces or pains- take tylenol or ibuprofen. -Throat lozenges if help -Restart Zyrtec daily Continue Flonase Will give Augmentin as ear pain has worsen post COVID. - cetirizine (ZYRTEC) 10 MG tablet; Take 1 tablet (10 mg total) by mouth daily.  Dispense: 90 tablet; Refill: 4 - amoxicillin-clavulanate (AUGMENTIN) 875-125 MG tablet; Take 1 tablet by mouth 2 (two) times daily.  Dispense: 14 tablet; Refill: 0     I discussed the assessment and treatment plan with the patient. The patient was provided an opportunity to ask questions and all were answered. The patient agreed with the plan and demonstrated an understanding of the instructions.   The patient was advised to call back or seek an in-person evaluation if the symptoms worsen or if the condition fails to improve as anticipated.  The above assessment and management plan was discussed with the patient. The patient verbalized understanding of and has agreed to the management plan. Patient is aware to call the clinic if symptoms persist or worsen. Patient is aware when to return to the clinic for a follow-up visit. Patient educated on when it is appropriate to go to the emergency department.   Time  call ended:  1:14 pm   I provided 13 minutes of  non face-to-face time during this encounter.    Evelina Dun, FNP

## 2020-09-05 ENCOUNTER — Telehealth: Payer: Self-pay | Admitting: Family Medicine

## 2020-09-05 DIAGNOSIS — I10 Essential (primary) hypertension: Secondary | ICD-10-CM

## 2020-09-05 NOTE — Telephone Encounter (Signed)
Pt called stating that her BP has been running high all day. Tried getting nurse for pt to talk to and while holding for nurse response, I explained to pt that for future, to try and call us ahead of time instead of waiting so late in the day to call that way we can make sure she is getting the best care. Pt then proceeds to whisper to someone while on the phone, and calls me "a hateful little bitch." She didn't think I could hear her, but I let her know that I wasn't being hateful and that I wanted to be able to help her but it makes it difficult to help if she waits so late in the day to call. (Our office closes in 15 minutes). Pt said "oh ok" and then whispers so someone again and says that everyone at our office is hateful.   Please call patient regarding BP.

## 2020-09-08 MED ORDER — LISINOPRIL 30 MG PO TABS
30.0000 mg | ORAL_TABLET | Freq: Every day | ORAL | 1 refills | Status: DC
Start: 2020-09-08 — End: 2021-03-24

## 2020-09-08 NOTE — Telephone Encounter (Signed)
Ok to increase dose to 30mg  daily.  Please send in new rx with new dose.  Keep follow up with me so we can check labs.

## 2020-09-08 NOTE — Telephone Encounter (Signed)
Pt called and aware of dose change. New dose 30 mg sent to pharm. Has f/u on 7/26 with Dr Darnell Level- will record readings and bring in then

## 2020-09-08 NOTE — Telephone Encounter (Signed)
Pt called  She has being having a HA for about a month.  Checked BP Friday and it was 180/105. Taking the lisinopril 10 mg and she don't think it's enough.  She took extra pills over the weekend - 30 mg  And last night was running around 135/80  Had pt to check BP while on the phone now - no meds yet this am = 191/96  Last took 10 mg last night at 930pm   Aware to go ahead and take a dose now and wait for our call back.

## 2020-09-16 ENCOUNTER — Other Ambulatory Visit: Payer: Self-pay

## 2020-09-16 ENCOUNTER — Encounter: Payer: Self-pay | Admitting: Family Medicine

## 2020-09-16 ENCOUNTER — Ambulatory Visit: Payer: BC Managed Care – PPO | Admitting: Family Medicine

## 2020-09-16 VITALS — BP 132/75 | HR 78 | Temp 97.4°F | Ht 64.0 in | Wt 193.0 lb

## 2020-09-16 DIAGNOSIS — J310 Chronic rhinitis: Secondary | ICD-10-CM

## 2020-09-16 DIAGNOSIS — J329 Chronic sinusitis, unspecified: Secondary | ICD-10-CM

## 2020-09-16 DIAGNOSIS — Z720 Tobacco use: Secondary | ICD-10-CM

## 2020-09-16 DIAGNOSIS — I10 Essential (primary) hypertension: Secondary | ICD-10-CM

## 2020-09-16 DIAGNOSIS — J392 Other diseases of pharynx: Secondary | ICD-10-CM | POA: Diagnosis not present

## 2020-09-16 MED ORDER — MOMETASONE FUROATE 50 MCG/ACT NA SUSP: 2.0000 | Freq: Every day | NASAL | 12 refills | Status: DC

## 2020-09-16 MED ORDER — ALBUTEROL SULFATE HFA 108 (90 BASE) MCG/ACT IN AERS: INHALATION_SPRAY | RESPIRATORY_TRACT | 2 refills | Status: DC

## 2020-09-16 NOTE — Progress Notes (Signed)
Subjective: CC: Hypertension PCP: Janora Norlander, DO TXH:FSFSE Whitney Ross is a 65 y.o. female presenting to clinic today for:  1.  Hypertension/ rhinosinusitis/ sore throat Patient contacted the office on 08/01/2020 for elevated blood pressures.  She was seen blood pressure systolics of 395V and was understandably concerned.  She was having associated headaches.  Her lisinopril was advanced to 30 mg daily and she notes resolution of symptoms since that time.  She does occasionally get some sinus pressure but notes that this is seemingly related to her COVID infection back in June.  She uses Flonase and Zyrtec but sometimes really does not feel that that does enough to relieve sinus pressure.  She notes that she has had a sore spot in the back of her throat on the right side since COVID infection in June.  She looked in the mirror and saw a spot on the back of her throat and wants [place] to take a look at this today.  Does not report any bleeding, difficulty swallowing, change in voice.  She is able to eat and drink without difficulty.  She does smoke daily.   ROS: Per HPI  Allergies  Allergen Reactions   Cortisporin [Bacitra-Neomycin-Polymyxin-Hc]    Past Medical History:  Diagnosis Date   Chronic kidney disease    hx muscle stones   Diverticulitis    Elevated cholesterol    GERD (gastroesophageal reflux disease)    Heart murmur    Hypertension    Lumbago    Neck pain    Nephrolithiasis     Current Outpatient Medications:    albuterol (VENTOLIN HFA) 108 (90 Base) MCG/ACT inhaler, INHALE TWO PUFFS INTO THE LUNGS EVERY 6 HOURS AS NEEDED FOR WHEEZING OR SHORTNESS OF BREATH, Disp: 8.5 g, Rfl: 2   cetirizine (ZYRTEC) 10 MG tablet, Take 1 tablet (10 mg total) by mouth daily., Disp: 90 tablet, Rfl: 4   diazepam (VALIUM) 5 MG tablet, Take 0.5-1 tablets (2.5-5 mg total) by mouth daily as needed for anxiety., Disp: 30 tablet, Rfl: 5   diclofenac (VOLTAREN) 75 MG EC tablet, Take 1 tablet  (75 mg total) by mouth 2 (two) times daily., Disp: 50 tablet, Rfl: 2   fluticasone (FLONASE) 50 MCG/ACT nasal spray, PLACE TWO SPRAYS INTO BOTH NOSTRILS DAILY, Disp: 16 g, Rfl: 5   ibuprofen (ADVIL) 800 MG tablet, Take 1 tablet (800 mg total) by mouth every 8 (eight) hours as needed for moderate pain., Disp: 60 tablet, Rfl: 0   lisinopril (ZESTRIL) 30 MG tablet, Take 1 tablet (30 mg total) by mouth daily., Disp: 90 tablet, Rfl: 1   pantoprazole (PROTONIX) 40 MG tablet, Take 1 tablet (40 mg total) by mouth daily., Disp: 90 tablet, Rfl: 3   pravastatin (PRAVACHOL) 20 MG tablet, Take 1 tablet (20 mg total) by mouth daily., Disp: 90 tablet, Rfl: 3 Social History   Socioeconomic History   Marital status: Married    Spouse name: Not on file   Number of children: 1   Years of education: Not on file   Highest education level: Not on file  Occupational History   Occupation: Radio broadcast assistant    Employer: [employer]  Tobacco Use   Smoking status: Every Day    Packs/day: 1.00    Types: Cigarettes   Smokeless tobacco: Never  Vaping Use   Vaping Use: Never used  Substance and Sexual Activity   Alcohol use: Not Currently    Comment: RARELY   Drug use: No  Sexual activity: Not Currently  Other Topics Concern   Not on file  Social History Narrative   Not on file   Social Determinants of Health   Financial Resource Strain: Not on file  Food Insecurity: Not on file  Transportation Needs: Not on file  Physical Activity: Not on file  Stress: Not on file  Social Connections: Not on file  Intimate Partner Violence: Not on file   Family History  Problem Relation Age of Onset   Heart failure Mother    Melanoma Father    Hypertension Sister    Hyperlipidemia Sister    Hyperlipidemia Brother    Hypertension Brother    Colon cancer Neg Hx    Esophageal cancer Neg Hx    Stomach cancer Neg Hx    Rectal cancer Neg Hx     Objective: Office vital signs reviewed. BP 132/75   Pulse 78    Temp (!) 97.4 F (36.3 C) (Temporal)   Ht '5\' 4"'  (1.626 m)   Wt 193 lb (87.5 kg)   SpO2 95%   BMI 33.13 kg/m   Physical Examination:  General: Awake, alert, No acute distress HEENT:   Ears: TMs intact bilaterally with normal reflex.  Nares: Mild erythema noted along the inner nasal septum.  No active nasal discharge or polyps identified Oropharynx with very small cystic mass that is approximately pea-sized on the right.  No ulceration, exudates or odors Cardio: regular rate and rhythm, S1S2 heard, no murmurs appreciated Pulm: clear to auscultation bilaterally, no wheezes, rhonchi or rales; normal work of breathing on room air     Assessment/ Plan: 65 y.o. 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  Blood pressures well controlled on current regimen  Trial of Nasonex instead of Flonase for rhinosinusitis  Lesion in the oropharynx looks cystic but given her history of tobacco use would like this to be further evaluated by ENT.  Referral has been placed  No orders of the defined types were placed in this encounter.  No orders of the defined types were placed in this encounter.    Janora Norlander, DO [place] [phone]

## 2020-09-17 LAB — CMP14+EGFR
ALT: 11 IU/L (ref 0–32)
AST: 17 IU/L (ref 0–40)
Albumin/Globulin Ratio: 1.6 (ref 1.2–2.2)
Albumin: 4.1 g/dL (ref 3.8–4.8)
Alkaline Phosphatase: 64 IU/L (ref 44–121)
BUN/Creatinine Ratio: 9 — ABNORMAL LOW (ref 12–28)
BUN: 8 mg/dL (ref 8–27)
Bilirubin Total: <0.2 mg/dL (ref 0.0–1.2)
CO2: 26 mmol/L (ref 20–29)
Calcium: 8.9 mg/dL (ref 8.7–10.3)
Chloride: 104 mmol/L (ref 96–106)
Creatinine, Ser: 0.91 mg/dL (ref 0.57–1.00)
Globulin, Total: 2.5 g/dL (ref 1.5–4.5)
Glucose: 93 mg/dL (ref 65–99)
Potassium: 4.1 mmol/L (ref 3.5–5.2)
Sodium: 144 mmol/L (ref 134–144)
Total Protein: 6.6 g/dL (ref 6.0–8.5)
eGFR: 71 mL/min/{1.73_m2} (ref >59)

## 2020-09-30 ENCOUNTER — Encounter: Payer: Self-pay | Admitting: Family Medicine

## 2020-12-04 ENCOUNTER — Other Ambulatory Visit: Payer: Self-pay | Admitting: Family Medicine

## 2020-12-04 DIAGNOSIS — I10 Essential (primary) hypertension: Secondary | ICD-10-CM

## 2021-01-22 ENCOUNTER — Ambulatory Visit (INDEPENDENT_AMBULATORY_CARE_PROVIDER_SITE_OTHER): Payer: BC Managed Care – PPO | Admitting: Family Medicine

## 2021-01-22 ENCOUNTER — Other Ambulatory Visit: Payer: Self-pay

## 2021-01-22 ENCOUNTER — Encounter: Payer: Self-pay | Admitting: Family Medicine

## 2021-01-22 VITALS — BP 143/72 | HR 76 | Temp 98.1°F | Ht 64.0 in | Wt 192.0 lb

## 2021-01-22 DIAGNOSIS — R051 Acute cough: Secondary | ICD-10-CM | POA: Diagnosis not present

## 2021-01-22 DIAGNOSIS — J0101 Acute recurrent maxillary sinusitis: Secondary | ICD-10-CM

## 2021-01-22 LAB — VERITOR FLU A/B WAIVED
Influenza A: NEGATIVE
Influenza B: NEGATIVE

## 2021-01-22 MED ORDER — AMOXICILLIN-POT CLAVULANATE 875-125 MG PO TABS: 1.0000 | ORAL_TABLET | Freq: Two times a day (BID) | ORAL | 0 refills | Status: AC

## 2021-01-22 MED ORDER — BENZONATATE 100 MG PO CAPS
100.0000 mg | ORAL_CAPSULE | Freq: Three times a day (TID) | ORAL | 0 refills | Status: DC | PRN
Start: 2021-01-22 — End: 2021-04-10

## 2021-01-22 NOTE — Progress Notes (Signed)
Days   Acute Office Visit  Subjective:    Patient ID: Whitney Ross, female    DOB: [birth year], 65 y.o.   MRN: [id number]  Chief Complaint  Patient presents with   Sinusitis    HPI Patient is in today for congestion and cough that started 9 days ago. She started to feel better a few days late. Then her symptoms worsened again last night. She reports facial pressure and tenderness around her cheeks since yesterday. She did have a temperature of 99.7 this morning. She also reports HA and body aches. She has taken dayquil and nyquil. She also takes medications for allergies and has a history of sinusitis. She denies chest pain or shortness of breath.   Past Medical History:  Diagnosis Date   Chronic kidney disease    hx muscle stones   Diverticulitis    Elevated cholesterol    GERD (gastroesophageal reflux disease)    Heart murmur    Hypertension    Lumbago    Neck pain    Nephrolithiasis     Past Surgical History:  Procedure Laterality Date   CESAREAN SECTION  1987   CYSTOSCOPY     KNEE ARTHROSCOPY W/ MENISCAL REPAIR Left    VAGINAL HYSTERECTOMY  1997   TOTAL VAGINAL    Family History  Problem Relation Age of Onset   Heart failure Mother    Melanoma Father    Hypertension Sister    Hyperlipidemia Sister    Hyperlipidemia Brother    Hypertension Brother    Colon cancer Neg Hx    Esophageal cancer Neg Hx    Stomach cancer Neg Hx    Rectal cancer Neg Hx     Social History   Socioeconomic History   Marital status: Married    Spouse name: Not on file   Number of children: 1   Years of education: Not on file   Highest education level: Not on file  Occupational History   Occupation: Radio broadcast assistant    Employer: [employer]  Tobacco Use   Smoking status: Every Day    Packs/day: 1.00    Types: Cigarettes   Smokeless tobacco: Never  Vaping Use   Vaping Use: Never used  Substance and Sexual Activity   Alcohol use: Not Currently    Comment: RARELY    Drug use: No   Sexual activity: Not Currently  Other Topics Concern   Not on file  Social History Narrative   Not on file   Social Determinants of Health   Financial Resource Strain: Not on file  Food Insecurity: Not on file  Transportation Needs: Not on file  Physical Activity: Not on file  Stress: Not on file  Social Connections: Not on file  Intimate Partner Violence: Not on file    Outpatient Medications Prior to Visit  Medication Sig Dispense Refill   albuterol (VENTOLIN HFA) 108 (90 Base) MCG/ACT inhaler INHALE TWO PUFFS INTO THE LUNGS EVERY 6 HOURS AS NEEDED FOR WHEEZING OR SHORTNESS OF BREATH 8.5 g 2   cetirizine (ZYRTEC) 10 MG tablet Take 1 tablet (10 mg total) by mouth daily. 90 tablet 4   diazepam (VALIUM) 5 MG tablet Take 0.5-1 tablets (2.5-5 mg total) by mouth daily as needed for anxiety. 30 tablet 5   diclofenac (VOLTAREN) 75 MG EC tablet Take 1 tablet (75 mg total) by mouth 2 (two) times daily. 50 tablet 2   lisinopril (ZESTRIL) 30 MG tablet Take 1 tablet (30 mg total) by  mouth daily. 90 tablet 1   mometasone (NASONEX) 50 MCG/ACT nasal spray Place 2 sprays into the nose daily. REPLACE Flonase 1 each 12   pantoprazole (PROTONIX) 40 MG tablet Take 1 tablet (40 mg total) by mouth daily. 90 tablet 3   pravastatin (PRAVACHOL) 20 MG tablet Take 1 tablet (20 mg total) by mouth daily. 90 tablet 3   ibuprofen (ADVIL) 800 MG tablet Take 1 tablet (800 mg total) by mouth every 8 (eight) hours as needed for moderate pain. 60 tablet 0   No facility-administered medications prior to visit.    Allergies  Allergen Reactions   Cortisporin [Bacitra-Neomycin-Polymyxin-Hc]     Review of Systems As per HPI.     Objective:    Physical Exam Vitals and nursing note reviewed.  Constitutional:      General: She is not in acute distress.    Appearance: She is not ill-appearing, toxic-appearing or diaphoretic.  HENT:     Right Ear: Tympanic membrane, ear canal and external ear  normal.     Left Ear: Tympanic membrane, ear canal and external ear normal.     Nose: Congestion present.     Right Sinus: Maxillary sinus tenderness present.     Left Sinus: Maxillary sinus tenderness present.     Mouth/Throat:     Pharynx: Posterior oropharyngeal erythema present. No pharyngeal swelling, oropharyngeal exudate or uvula swelling.     Tonsils: No tonsillar exudate or tonsillar abscesses. 1+ on the right. 1+ on the left.  Cardiovascular:     Rate and Rhythm: Normal rate and regular rhythm.     Heart sounds: Normal heart sounds. No murmur heard. Pulmonary:     Effort: No respiratory distress.     Breath sounds: Normal breath sounds. No stridor. No wheezing, rhonchi or rales.  Chest:     Chest wall: No tenderness.  Musculoskeletal:     Right lower leg: No edema.     Left lower leg: No edema.  Skin:    General: Skin is warm and dry.  Neurological:     Mental Status: She is alert and oriented to person, place, and time.  Psychiatric:        Mood and Affect: Mood normal.        Behavior: Behavior normal.    BP (!) 143/72   Pulse 76   Temp 98.1 F (36.7 C) (Temporal)   Ht _0  (1.626 m)   Wt 192 lb (87.1 kg)   BMI 32.96 kg/m  Wt Readings from Last 3 Encounters:  01/22/21 192 lb (87.1 kg)  09/16/20 193 lb (87.5 kg)  05/13/20 188 lb (85.3 kg)    Health Maintenance Due  Topic Date Due   Pneumococcal Vaccine 42-49 Years old (1 - PCV) Never done   Zoster Vaccines- Shingrix (1 of 2) Never done   MAMMOGRAM  07/26/2014   PAP SMEAR-Modifier  09/12/2020   INFLUENZA VACCINE  09/22/2020   TETANUS/TDAP  11/06/2020    There are no preventive care reminders to display for this patient.   Lab Results  Component Value Date   TSH 4.030 03/23/2018   Lab Results  Component Value Date   WBC 6.8 05/09/2019   HGB 12.4 05/09/2019   HCT 37.2 05/09/2019   MCV 91 05/09/2019   PLT 305 05/09/2019   Lab Results  Component Value Date   NA 144 09/16/2020   K 4.1  09/16/2020   CO2 26 09/16/2020   GLUCOSE 93 09/16/2020   BUN  8 09/16/2020   CREATININE 0.91 09/16/2020   BILITOT <0.2 09/16/2020   ALKPHOS 64 09/16/2020   AST 17 09/16/2020   ALT 11 09/16/2020   PROT 6.6 09/16/2020   ALBUMIN 4.1 09/16/2020   CALCIUM 8.9 09/16/2020   EGFR 71 09/16/2020   Lab Results  Component Value Date   CHOL 205 (H) 03/23/2018   Lab Results  Component Value Date   HDL 68 03/23/2018   Lab Results  Component Value Date   LDLCALC 122 (H) 03/23/2018   Lab Results  Component Value Date   TRIG 74 03/23/2018   Lab Results  Component Value Date   CHOLHDL 3.0 03/23/2018   No results found for: HGBA1C     Assessment & Plan:   Estera was seen today for sinusitis.  Diagnoses and all orders for this visit:  Acute cough Rapid flu negative today. Tessalon perles as needed.  -     Veritor Flu A/B Waived -     benzonatate (TESSALON PERLES) 100 MG capsule; Take 1 capsule (100 mg total) by mouth 3 (three) times daily as needed for cough.  Acute recurrent maxillary sinusitis Augmentin as below. Discussed symptomatic care and return precautions.  -     amoxicillin-clavulanate (AUGMENTIN) 875-125 MG tablet; Take 1 tablet by mouth 2 (two) times daily for 7 days. -     benzonatate (TESSALON PERLES) 100 MG capsule; Take 1 capsule (100 mg total) by mouth 3 (three) times daily as needed for cough.  The patient indicates understanding of these issues and agrees with the plan.  Gwenlyn Perking, FNP

## 2021-01-22 NOTE — Patient Instructions (Signed)

## 2021-02-06 ENCOUNTER — Other Ambulatory Visit: Payer: Self-pay | Admitting: Family Medicine

## 2021-02-06 DIAGNOSIS — K219 Gastro-esophageal reflux disease without esophagitis: Secondary | ICD-10-CM

## 2021-02-06 DIAGNOSIS — E7800 Pure hypercholesterolemia, unspecified: Secondary | ICD-10-CM

## 2021-02-13 ENCOUNTER — Ambulatory Visit: Payer: BC Managed Care – PPO | Admitting: Nurse Practitioner

## 2021-02-13 ENCOUNTER — Encounter: Payer: Self-pay | Admitting: Nurse Practitioner

## 2021-02-13 DIAGNOSIS — J440 Chronic obstructive pulmonary disease with acute lower respiratory infection: Secondary | ICD-10-CM | POA: Diagnosis not present

## 2021-02-13 DIAGNOSIS — J209 Acute bronchitis, unspecified: Secondary | ICD-10-CM

## 2021-02-13 MED ORDER — ALBUTEROL SULFATE HFA 108 (90 BASE) MCG/ACT IN AERS
2.0000 | INHALATION_SPRAY | Freq: Four times a day (QID) | RESPIRATORY_TRACT | 0 refills | Status: DC | PRN
Start: 2021-02-13 — End: 2021-10-08

## 2021-02-13 MED ORDER — PREDNISONE 20 MG PO TABS: 40.0000 mg | ORAL_TABLET | Freq: Every day | ORAL | 0 refills | Status: AC

## 2021-02-13 MED ORDER — DOXYCYCLINE HYCLATE 100 MG PO TABS: 100.0000 mg | ORAL_TABLET | Freq: Two times a day (BID) | ORAL | 0 refills | Status: DC

## 2021-02-13 NOTE — Progress Notes (Signed)
Virtual Visit  Note Due to COVID-19 pandemic this visit was conducted virtually. This visit type was conducted due to national recommendations for restrictions regarding the COVID-19 Pandemic (e.g. social distancing, sheltering in place) in an effort to limit this patient's exposure and mitigate transmission in our community. All issues noted in this document were discussed and addressed.  A physical exam was not performed with this format.  I connected with Whitney Ross on 02/13/21 at 12:33 by telephone and verified that I am speaking with the correct person using two identifiers. Whitney Ross is currently located at home and her husband is currently with her during visit. The provider, Mary-Margaret Hassell Done, FNP is located in their office at time of visit.  I discussed the limitations, risks, security and privacy concerns of performing an evaluation and management service by telephone and the availability of in person appointments. I also discussed with the patient that there may be a patient responsible charge related to this service. The patient expressed understanding and agreed to proceed.   History and Present Illness:  Patient states that she started getting sick yesterday. She had sinus infection a couple of weeks ago and took antiotics. Started getting sick again 2 days after finished antibiotic.      Review of Systems  Constitutional:  Negative for chills and fever.  HENT:  Positive for congestion. Negative for sore throat.   Respiratory:  Positive for cough and shortness of breath. Negative for sputum production.   Musculoskeletal:  Negative for myalgias.  Neurological:  Negative for dizziness and headaches.    Observations/Objective: Alert and oriented- answers all questions appropriately No distress Deep cough Raspy voice  Assessment and Plan: Whitney Ross in today with chief complaint of No chief complaint on file.   1. Acute bronchitis with COPD  ([place]) 1. Take meds as prescribed 2. Use a cool mist humidifier especially during the winter months and when heat has been humid. 3. Use saline nose sprays frequently 4. Saline irrigations of the nose can be very helpful if done frequently.  * 4X daily for 1 week*  * Use of a nettie pot can be helpful with this. Follow directions with this* 5. Drink plenty of fluids 6. Keep thermostat turn down low 7.For any cough or congestion- delsym OTC 8. For fever or aces or pains- take tylenol or ibuprofen appropriate for age and weight.  * for fevers greater than 101 orally you may alternate ibuprofen and tylenol every  3 hours.   Meds ordered this encounter  Medications   predniSONE (DELTASONE) 20 MG tablet    Sig: Take 2 tablets (40 mg total) by mouth daily with breakfast for 5 days. 2 po daily for 5 days    Dispense:  10 tablet    Refill:  0    Order Specific Question:   Supervising Provider    Answer:   Caryl Pina A [[id number]]   albuterol (VENTOLIN HFA) 108 (90 Base) MCG/ACT inhaler    Sig: Inhale 2 puffs into the lungs every 6 (six) hours as needed for wheezing or shortness of breath.    Dispense:  8 g    Refill:  0    Order Specific Question:   Supervising Provider    Answer:   Caryl Pina A [[id number]]   doxycycline (VIBRA-TABS) 100 MG tablet    Sig: Take 1 tablet (100 mg total) by mouth 2 (two) times daily. 1 po bid    Dispense:  20 tablet  Refill:  0    Order Specific Question:   Supervising Provider    Answer:   Caryl Pina A [[id number]]        Follow Up Instructions: Prn     I discussed the assessment and treatment plan with the patient. The patient was provided an opportunity to ask questions and all were answered. The patient agreed with the plan and demonstrated an understanding of the instructions.   The patient was advised to call back or seek an in-person evaluation if the symptoms worsen or if the condition fails to improve as  anticipated.  The above assessment and management plan was discussed with the patient. The patient verbalized understanding of and has agreed to the management plan. Patient is aware to call the clinic if symptoms persist or worsen. Patient is aware when to return to the clinic for a follow-up visit. Patient educated on when it is appropriate to go to the emergency department.   Time call ended:  12:45  I provided 12 minutes of  non face-to-face time during this encounter.    Mary-Margaret Hassell Done, FNP

## 2021-02-13 NOTE — Patient Instructions (Signed)
Acute Bronchitis, Adult ?Acute bronchitis is when air tubes in the lungs (bronchi) suddenly get swollen. The condition can make it hard for you to breathe. In adults, acute bronchitis usually goes away within 2 weeks. A cough caused by bronchitis may last up to 3 weeks. Smoking, allergies, and asthma can make the condition worse. ?What are the causes? ?Germs that cause cold and flu (viruses). The most common cause of this condition is the virus that causes the common cold. ?Bacteria. ?Substances that bother (irritate) the lungs, including: ?Smoke from cigarettes and other types of tobacco. ?Dust and pollen. ?Fumes from chemicals, gases, or burned fuel. ?Indoor or outdoor air pollution. ?What increases the risk? ?A weak body's defense system. This is also called the immune system. ?Any condition that affects your lungs and breathing, such as asthma. ?What are the signs or symptoms? ?A cough. ?Coughing up clear, yellow, or green mucus. ?Making high-pitched whistling sounds when you breathe, most often when you breathe out (wheezing). ?Runny or stuffy nose. ?Having too much mucus in your lungs (chest congestion). ?Shortness of breath. ?Body aches. ?A sore throat. ?How is this treated? ?Acute bronchitis may go away over time without treatment. Your doctor may tell you to: ?Drink more fluids. This will help thin your mucus so it is easier to cough up. ?Use a device that gets medicine into your lungs (inhaler). ?Use a vaporizer or a humidifier. These are machines that add water to the air. This helps with coughing and poor breathing. ?Take a medicine that thins mucus and helps clear it from your lungs. ?Take a medicine that prevents or stops coughing. ?It is not common to take an antibiotic medicine for this condition. ?Follow these instructions at home: ? ?Take over-the-counter and prescription medicines only as told by your doctor. ?Use an inhaler, vaporizer, or humidifier as told by your doctor. ?Take two teaspoons (10  mL) of honey at bedtime. This helps lessen your coughing at night. ?Drink enough fluid to keep your pee (urine) pale yellow. ?Do not smoke or use any products that contain nicotine or tobacco. If you need help quitting, ask your doctor. ?Get a lot of rest. ?Return to your normal activities when your doctor says that it is safe. ?Keep all follow-up visits. ?How is this prevented? ? ?Wash your hands often with soap and water for at least 20 seconds. If you cannot use soap and water, use hand sanitizer. ?Avoid contact with people who have cold symptoms. ?Try not to touch your mouth, nose, or eyes with your hands. ?Avoid breathing in smoke or chemical fumes. ?Make sure to get the flu shot every year. ?Contact a doctor if: ?Your symptoms do not get better in 2 weeks. ?You have trouble coughing up the mucus. ?Your cough keeps you awake at night. ?You have a fever. ?Get help right away if: ?You cough up blood. ?You have chest pain. ?You have very bad shortness of breath. ?You faint or keep feeling like you are going to faint. ?You have a very bad headache. ?Your fever or chills get worse. ?These symptoms may be an emergency. Get help right away. Call your local emergency services (911 in the [place] ?Do not wait to see if the symptoms will go away. ?Do not drive yourself to the hospital. ?Summary ?Acute bronchitis is when air tubes in the lungs (bronchi) suddenly get swollen. In adults, acute bronchitis usually goes away within 2 weeks. ?Drink more fluids. This will help thin your mucus so it is easier to   cough up. ?Take over-the-counter and prescription medicines only as told by your doctor. ?Contact a doctor if your symptoms do not improve after 2 weeks of treatment. ?This information is not intended to replace advice given to you by your health care provider. Make sure you discuss any questions you have with your health care provider. ?Document Revised: 06/11/2020 Document Reviewed: 06/11/2020 ?Elsevier Patient Education  ? 2022 Elsevier Inc. ? ?

## 2021-03-23 ENCOUNTER — Other Ambulatory Visit: Payer: Self-pay | Admitting: Family Medicine

## 2021-03-23 DIAGNOSIS — I10 Essential (primary) hypertension: Secondary | ICD-10-CM

## 2021-04-10 ENCOUNTER — Ambulatory Visit (INDEPENDENT_AMBULATORY_CARE_PROVIDER_SITE_OTHER): Payer: BC Managed Care – PPO | Admitting: Family Medicine

## 2021-04-10 ENCOUNTER — Encounter: Payer: Self-pay | Admitting: Family Medicine

## 2021-04-10 VITALS — BP 131/67 | HR 70 | Temp 97.7°F | Ht 64.0 in | Wt 198.4 lb

## 2021-04-10 DIAGNOSIS — F418 Other specified anxiety disorders: Secondary | ICD-10-CM

## 2021-04-10 DIAGNOSIS — E7800 Pure hypercholesterolemia, unspecified: Secondary | ICD-10-CM | POA: Diagnosis not present

## 2021-04-10 DIAGNOSIS — I10 Essential (primary) hypertension: Secondary | ICD-10-CM

## 2021-04-10 DIAGNOSIS — Z79899 Other long term (current) drug therapy: Secondary | ICD-10-CM

## 2021-04-10 DIAGNOSIS — K219 Gastro-esophageal reflux disease without esophagitis: Secondary | ICD-10-CM

## 2021-04-10 MED ORDER — PRAVASTATIN SODIUM 20 MG PO TABS: 20.0000 mg | ORAL_TABLET | Freq: Every day | ORAL | 3 refills | Status: DC

## 2021-04-10 MED ORDER — DIAZEPAM 5 MG PO TABS: 2.5000 mg | ORAL_TABLET | Freq: Every day | ORAL | 5 refills | Status: DC | PRN

## 2021-04-10 MED ORDER — LISINOPRIL 30 MG PO TABS: 30.0000 mg | ORAL_TABLET | Freq: Every day | ORAL | 3 refills | Status: DC

## 2021-04-10 MED ORDER — PANTOPRAZOLE SODIUM 40 MG PO TBEC: 40.0000 mg | DELAYED_RELEASE_TABLET | Freq: Every day | ORAL | 3 refills | Status: DC

## 2021-04-10 MED ORDER — DICLOFENAC SODIUM 75 MG PO TBEC: 75.0000 mg | DELAYED_RELEASE_TABLET | Freq: Two times a day (BID) | ORAL | 2 refills | Status: DC

## 2021-04-10 NOTE — Progress Notes (Signed)
Subjective: CC: Follow-up anxiety, hypertension, hyperlipidemia PCP: Janora Norlander, DO QAS:TMHDQ Whitney Ross is a 66 y.o. female presenting to clinic today for:  1.  Situational anxiety Patient reports overall things have been going okay.  She continues to have rare use of the Valium but does need a refill.  Last refill was March 2022.  She denies any excessive daytime sedation, falls, respiratory depression, visual or auditory hallucinations.  She never drinks alcohol and never drive so this medicine  2.  Hypertension with hyperlipidemia Patient is compliant with lisinopril and Pravachol.  No chest pain, shortness of breath.  3.  GERD Patient is compliant with PPI.  She actually has run out of medicine and will need a renewal.  No nausea, vomiting reported   ROS: Per HPI  Allergies  Allergen Reactions   Cortisporin [Bacitra-Neomycin-Polymyxin-Hc]    Past Medical History:  Diagnosis Date   Chronic kidney disease    hx muscle stones   Diverticulitis    Elevated cholesterol    GERD (gastroesophageal reflux disease)    Heart murmur    Hypertension    Lumbago    Neck pain    Nephrolithiasis     Current Outpatient Medications:    albuterol (VENTOLIN HFA) 108 (90 Base) MCG/ACT inhaler, Inhale 2 puffs into the lungs every 6 (six) hours as needed for wheezing or shortness of breath., Disp: 8 g, Rfl: 0   benzonatate (TESSALON PERLES) 100 MG capsule, Take 1 capsule (100 mg total) by mouth 3 (three) times daily as needed for cough., Disp: 20 capsule, Rfl: 0   cetirizine (ZYRTEC) 10 MG tablet, Take 1 tablet (10 mg total) by mouth daily., Disp: 90 tablet, Rfl: 4   diazepam (VALIUM) 5 MG tablet, Take 0.5-1 tablets (2.5-5 mg total) by mouth daily as needed for anxiety., Disp: 30 tablet, Rfl: 5   diclofenac (VOLTAREN) 75 MG EC tablet, Take 1 tablet (75 mg total) by mouth 2 (two) times daily., Disp: 50 tablet, Rfl: 2   lisinopril (ZESTRIL) 30 MG tablet, Take 1 tablet (30 mg total) by  mouth daily., Disp: 90 tablet, Rfl: 0   mometasone (NASONEX) 50 MCG/ACT nasal spray, Place 2 sprays into the nose daily. REPLACE Flonase, Disp: 1 each, Rfl: 12   pantoprazole (PROTONIX) 40 MG tablet, Take 1 tablet (40 mg total) by mouth daily. (NEEDS TO BE SEEN BEFORE NEXT REFILL), Disp: 30 tablet, Rfl: 0   pravastatin (PRAVACHOL) 20 MG tablet, Take 1 tablet (20 mg total) by mouth daily. (NEEDS TO BE SEEN BEFORE NEXT REFILL), Disp: 90 tablet, Rfl: 0 Social History   Socioeconomic History   Marital status: Married    Spouse name: Not on file   Number of children: 1   Years of education: Not on file   Highest education level: Not on file  Occupational History   Occupation: Radio broadcast assistant    Employer: [employer]  Tobacco Use   Smoking status: Every Day    Packs/day: 1.00    Types: Cigarettes   Smokeless tobacco: Never  Vaping Use   Vaping Use: Never used  Substance and Sexual Activity   Alcohol use: Not Currently    Comment: RARELY   Drug use: No   Sexual activity: Not Currently  Other Topics Concern   Not on file  Social History Narrative   Not on file   Social Determinants of Health   Financial Resource Strain: Not on file  Food Insecurity: Not on file  Transportation Needs: Not  on file  Physical Activity: Not on file  Stress: Not on file  Social Connections: Not on file  Intimate Partner Violence: Not on file   Family History  Problem Relation Age of Onset   Heart failure Mother    Melanoma Father    Hypertension Sister    Hyperlipidemia Sister    Hyperlipidemia Brother    Hypertension Brother    Colon cancer Neg Hx    Esophageal cancer Neg Hx    Stomach cancer Neg Hx    Rectal cancer Neg Hx     Objective: Office vital signs reviewed. BP 131/67    Pulse 70    Temp 97.7 F (36.5 C)    Ht 5\' 4"  (1.626 m)    Wt 198 lb 6.4 oz (90 kg)    SpO2 96%    BMI 34.06 kg/m   Physical Examination:  General: Awake, alert, well nourished, No acute distress HEENT:  Sclera white.  Moist mucous membranes Cardio: regular rate and rhythm, S1S2 heard, no murmurs appreciated Pulm: clear to auscultation bilaterally, no wheezes, rhonchi or rales; normal work of breathing on room air MSK: Ambulating independently Psych: Mood stable, speech normal, affect appropriate  Depression screen [facility] 2/9 04/10/2021 01/22/2021 09/16/2020  Decreased Interest 0 0 1  Down, Depressed, Hopeless 0 0 1  PHQ - 2 Score 0 0 2  Altered sleeping - 0 0  Tired, decreased energy - 0 1  Change in appetite - 0 0  Feeling bad or failure about yourself  - 0 1  Trouble concentrating - 0 0  Moving slowly or fidgety/restless - 0 0  Suicidal thoughts - 0 0  PHQ-9 Score - 0 4  Difficult doing work/chores - Not difficult at all Not difficult at all  Some recent data might be hidden   GAD 7 : Generalized Anxiety Score 04/10/2021 01/22/2021 05/13/2020  Nervous, Anxious, on Edge 0 0 0  Control/stop worrying 0 0 0  Worry too much - different things 0 0 0  Trouble relaxing 0 0 1  Restless 0 0 0  Easily annoyed or irritable 0 0 2  Afraid - awful might happen 0 0 0  Total GAD 7 Score 0 0 3  Anxiety Difficulty Not difficult at all Not difficult at all Somewhat difficult    Assessment/ Plan: 66 y.o. female   Essential hypertension - Plan: lisinopril (ZESTRIL) 30 MG tablet  Pure hypercholesterolemia - Plan: pravastatin (PRAVACHOL) 20 MG tablet  Situational anxiety - Plan: ToxASSURE Select 13 (MW), Urine, diazepam (VALIUM) 5 MG tablet  Chronic use of benzodiazepine for therapeutic purpose - Plan: ToxASSURE Select 13 (MW), Urine  Controlled substance agreement signed - Plan: ToxASSURE Select 13 (MW), Urine  Gastroesophageal reflux disease without esophagitis - Plan: pantoprazole (PROTONIX) 40 MG tablet  Blood pressures well controlled.  Continue lisinopril  Plan for fasting lipid panel at next visit.  Continue statin  Situational anxiety intermittently exacerbated.  Valium renewed.   National narcotic database reviewed and there were no red flags.  CSA and UDS were updated as per office policy  GERD stable.  PPI renewed  No orders of the defined types were placed in this encounter.  No orders of the defined types were placed in this encounter.    Janora Norlander, DO [place] [phone]

## 2021-04-15 LAB — TOXASSURE SELECT 13 (MW), URINE

## 2021-06-24 ENCOUNTER — Other Ambulatory Visit: Payer: Self-pay

## 2021-06-24 HISTORY — PX: BELPHAROPTOSIS REPAIR: SHX369

## 2021-08-03 ENCOUNTER — Other Ambulatory Visit: Payer: Self-pay | Admitting: Family

## 2021-08-03 DIAGNOSIS — J019 Acute sinusitis, unspecified: Secondary | ICD-10-CM

## 2021-10-05 NOTE — Patient Instructions (Signed)
Our records indicate that you are due for your annual mammogram/breast imaging. While there is no way to prevent breast cancer, early detection provides the best opportunity for curing it. For women over the age of 40, the American Cancer Society recommends a yearly clinical breast exam and a yearly mammogram. These practices have saved thousands of lives. We need your help to ensure that you are receiving optimal medical care. Please call the imaging location that has done you previous mammograms. Please remember to list us as your primary care. This helps make sure we receive a report and can update your chart.  Below is the contact information for several local breast imaging centers. You may call the location that works best for you, and they will be happy to assistance in making you an appointment. You do not need an order for a regular screening mammogram. However, if you are having any problems or concerns with you breast area, please let your primary care provider know, and appropriate orders will be placed. Please let our office know if you have any questions or concerns. Or if you need information for another imaging center not on this list or outside of the area. We are commented to working with you on your health care journey.   The mobile unit/bus ([facility]) - they come twice a month to our location.  These appointments can be made through our office or by call The Breast Center  [facility]  [address] [place], [place] [address] Phone [phone]  [facility] Radiology Department  [address]  [place], [place] [address] [phone]  [facility])  [address] [place], [place] [address] [phone]  [facility] [place]  [address] [place] [address] [phone]  [facility]  [address], [address] [place] [address] [phone]  [facility] [place]  [address] [place], [place] [address] [phone]  [facility] [address] [place], [place] [address] [phone]  [facility] [address]  [address] [place], [place] [address] [phone]  [facility] [address] [place] [address] [phone]     

## 2021-10-07 ENCOUNTER — Ambulatory Visit (INDEPENDENT_AMBULATORY_CARE_PROVIDER_SITE_OTHER): Payer: BC Managed Care – PPO

## 2021-10-07 ENCOUNTER — Ambulatory Visit (INDEPENDENT_AMBULATORY_CARE_PROVIDER_SITE_OTHER): Payer: BC Managed Care – PPO | Admitting: Family Medicine

## 2021-10-07 ENCOUNTER — Other Ambulatory Visit (HOSPITAL_COMMUNITY)
Admission: RE | Admit: 2021-10-07 | Discharge: 2021-10-07 | Disposition: A | Discharge status: Home / Self Care | Payer: BC Managed Care – PPO | Source: Ambulatory Visit | Attending: Family Medicine | Admitting: Family Medicine

## 2021-10-07 ENCOUNTER — Encounter: Payer: Self-pay | Admitting: Family Medicine

## 2021-10-07 VITALS — BP 133/69 | HR 64 | Temp 97.4°F | Ht 64.0 in | Wt 203.0 lb

## 2021-10-07 DIAGNOSIS — K219 Gastro-esophageal reflux disease without esophagitis: Secondary | ICD-10-CM

## 2021-10-07 DIAGNOSIS — E7800 Pure hypercholesterolemia, unspecified: Secondary | ICD-10-CM | POA: Diagnosis not present

## 2021-10-07 DIAGNOSIS — Z780 Asymptomatic menopausal state: Secondary | ICD-10-CM

## 2021-10-07 DIAGNOSIS — J301 Allergic rhinitis due to pollen: Secondary | ICD-10-CM

## 2021-10-07 DIAGNOSIS — Z720 Tobacco use: Secondary | ICD-10-CM

## 2021-10-07 DIAGNOSIS — Z0001 Encounter for general adult medical examination with abnormal findings: Secondary | ICD-10-CM | POA: Diagnosis not present

## 2021-10-07 DIAGNOSIS — F418 Other specified anxiety disorders: Secondary | ICD-10-CM

## 2021-10-07 DIAGNOSIS — I10 Essential (primary) hypertension: Secondary | ICD-10-CM | POA: Diagnosis not present

## 2021-10-07 DIAGNOSIS — Z124 Encounter for screening for malignant neoplasm of cervix: Secondary | ICD-10-CM | POA: Insufficient documentation

## 2021-10-07 DIAGNOSIS — Z79899 Other long term (current) drug therapy: Secondary | ICD-10-CM

## 2021-10-07 DIAGNOSIS — Z0000 Encounter for general adult medical examination without abnormal findings: Secondary | ICD-10-CM

## 2021-10-07 LAB — BAYER DCA HB A1C WAIVED: HB A1C (BAYER DCA - WAIVED): 5.7 % — ABNORMAL HIGH (ref 4.8–5.6)

## 2021-10-07 MED ORDER — PRAVASTATIN SODIUM 20 MG PO TABS
20.0000 mg | ORAL_TABLET | Freq: Every day | ORAL | 3 refills | Status: DC
Start: 2021-10-07 — End: 2022-04-12

## 2021-10-07 MED ORDER — AZELASTINE HCL 0.1 % NA SOLN: 1.0000 | Freq: Two times a day (BID) | NASAL | 12 refills | Status: DC

## 2021-10-07 MED ORDER — DICLOFENAC SODIUM 75 MG PO TBEC
75.0000 mg | DELAYED_RELEASE_TABLET | Freq: Two times a day (BID) | ORAL | 2 refills | Status: DC
Start: 2021-10-07 — End: 2022-05-11

## 2021-10-07 MED ORDER — PANTOPRAZOLE SODIUM 40 MG PO TBEC: 40.0000 mg | DELAYED_RELEASE_TABLET | Freq: Every day | ORAL | 3 refills | Status: DC

## 2021-10-07 MED ORDER — DIAZEPAM 5 MG PO TABS: 2.5000 mg | ORAL_TABLET | Freq: Every day | ORAL | 5 refills | Status: AC | PRN

## 2021-10-07 MED ORDER — LISINOPRIL 30 MG PO TABS: 30.0000 mg | ORAL_TABLET | Freq: Every day | ORAL | 3 refills | Status: DC

## 2021-10-07 MED ORDER — CETIRIZINE HCL 10 MG PO TABS: 10.0000 mg | ORAL_TABLET | Freq: Every day | ORAL | 1 refills | Status: DC

## 2021-10-07 NOTE — Progress Notes (Signed)
Whitney Ross is a 66 y.o. female presents to office today for annual physical exam examination.    Concerns today include: 1.  Reports some mild rhinorrhea but otherwise has no concerns today.  Utilizing her Zyrtec, Nasonex.  Previously on Flonase which was not especially helpful.  Occupation: Charity fundraiser, Marital status: married, Substance use: smoker Diet: typical American, Exercise: No structured Last colonoscopy: Up-to-date.  Will be due in 2028 Last mammogram: Mammogram due Last pap smear: Has history of hysterectomy and thinks that cervix was removed Refills needed today: All Immunizations needed: Immunization History  Administered Date(s) Administered   Influenza Split 03/06/2011     Past Medical History:  Diagnosis Date   Chronic kidney disease    hx muscle stones   Diverticulitis    Elevated cholesterol    GERD (gastroesophageal reflux disease)    Heart murmur    Hypertension    Lumbago    Neck pain    Nephrolithiasis    Social History   Socioeconomic History   Marital status: Married    Spouse name: Not on file   Number of children: 1   Years of education: Not on file   Highest education level: Not on file  Occupational History   Occupation: Radio broadcast assistant    Employer: [employer]  Tobacco Use   Smoking status: Every Day    Packs/day: 1.00    Types: Cigarettes   Smokeless tobacco: Never  Vaping Use   Vaping Use: Never used  Substance and Sexual Activity   Alcohol use: Not Currently    Comment: RARELY   Drug use: No   Sexual activity: Not Currently  Other Topics Concern   Not on file  Social History Narrative   Not on file   Social Determinants of Health   Financial Resource Strain: Not on file  Food Insecurity: Not on file  Transportation Needs: Not on file  Physical Activity: Not on file  Stress: Not on file  Social Connections: Not on file  Intimate Partner Violence: Not on file   Past Surgical History:  Procedure Laterality  Date   CESAREAN SECTION  1987   CYSTOSCOPY     KNEE ARTHROSCOPY W/ MENISCAL REPAIR Left    RECONSTRUCTION OF EYELID     VAGINAL HYSTERECTOMY  1997   TOTAL VAGINAL   Family History  Problem Relation Age of Onset   Heart failure Mother    Melanoma Father    Hypertension Sister    Hyperlipidemia Sister    Hyperlipidemia Brother    Hypertension Brother    Colon cancer Neg Hx    Esophageal cancer Neg Hx    Stomach cancer Neg Hx    Rectal cancer Neg Hx     Current Outpatient Medications:    albuterol (VENTOLIN HFA) 108 (90 Base) MCG/ACT inhaler, Inhale 2 puffs into the lungs every 6 (six) hours as needed for wheezing or shortness of breath., Disp: 8 g, Rfl: 0   azelastine (ASTELIN) 0.1 % nasal spray, Place 1 spray into both nostrils 2 (two) times daily., Disp: 30 mL, Rfl: 12   mometasone (NASONEX) 50 MCG/ACT nasal spray, Place 2 sprays into the nose daily. REPLACE Flonase, Disp: 1 each, Rfl: 12   cetirizine (ZYRTEC) 10 MG tablet, Take 1 tablet (10 mg total) by mouth daily., Disp: 90 tablet, Rfl: 1   diazepam (VALIUM) 5 MG tablet, Take 0.5-1 tablets (2.5-5 mg total) by mouth daily as needed for anxiety., Disp: 30 tablet, Rfl: 5  diclofenac (VOLTAREN) 75 MG EC tablet, Take 1 tablet (75 mg total) by mouth 2 (two) times daily., Disp: 50 tablet, Rfl: 2   lisinopril (ZESTRIL) 30 MG tablet, Take 1 tablet (30 mg total) by mouth daily., Disp: 90 tablet, Rfl: 3   pantoprazole (PROTONIX) 40 MG tablet, Take 1 tablet (40 mg total) by mouth daily., Disp: 90 tablet, Rfl: 3   pravastatin (PRAVACHOL) 20 MG tablet, Take 1 tablet (20 mg total) by mouth daily., Disp: 90 tablet, Rfl: 3  Allergies  Allergen Reactions   Cortisporin [Bacitra-Neomycin-Polymyxin-Hc]      ROS: Review of Systems A comprehensive review of systems was negative except for: Ears, nose, mouth, throat, and face: positive for rhinorrhea    Physical exam BP (!) 140/77   Pulse 64   Temp (!) 97.4 F (36.3 C)   Ht '5\' 4"'  (1.626 m)    Wt 203 lb (92.1 kg)   SpO2 95%   BMI 34.84 kg/m  General appearance: alert, cooperative, appears stated age, and no distress Head: Normocephalic, without obvious abnormality, atraumatic Eyes: negative findings: lids and lashes normal, conjunctivae and sclerae normal, corneas clear, and pupils equal, round, reactive to light and accomodation Ears: normal TM's and external ear canals both ears Nose: Nares normal. Septum midline. Mucosa normal. No drainage or sinus tenderness. Throat: lips, mucosa, and tongue normal; teeth and gums normal Neck: no adenopathy, no carotid bruit, supple, symmetrical, trachea midline, and thyroid not enlarged, symmetric, no tenderness/mass/nodules Back: symmetric, no curvature. ROM normal. No CVA tenderness. Lungs: clear to auscultation bilaterally Heart: regular rate and rhythm, S1, S2 normal, no murmur, click, rub or gallop Abdomen: soft, non-tender; bowel sounds normal; no masses,  no organomegaly Pelvic: external genitalia normal, no adnexal masses or tenderness, rectovaginal septum normal, and mild leukorrhea noted.  Cervix surgically absent.  No lesions appreciated within the vaginal vault Extremities: extremities normal, atraumatic, no cyanosis or edema Pulses: 2+ and symmetric Skin: Skin color, texture, turgor normal. No rashes or lesions Lymph nodes: Cervical, supraclavicular, and axillary nodes normal. Neurologic: Grossly normal Psych: Mood stable, speech normal, affect appropriate   Assessment/ Plan: Whitney Ross here for annual physical exam.   Annual physical exam  Screening for malignant neoplasm of cervix - Plan: Cytology - PAP()  Pure hypercholesterolemia - Plan: CMP14+EGFR, Lipid Panel, TSH, pravastatin (PRAVACHOL) 20 MG tablet  Essential hypertension - Plan: CMP14+EGFR, lisinopril (ZESTRIL) 30 MG tablet  Chronic use of benzodiazepine for therapeutic purpose  Asymptomatic postmenopausal estrogen deficiency - Plan: DG  [facility] DEXA  Morbid obesity ([place]) - Plan: Bayer DCA Hb A1c Waived  Tobacco use - Plan: CBC  Situational anxiety - Plan: diazepam (VALIUM) 5 MG tablet  Gastroesophageal reflux disease without esophagitis - Plan: pantoprazole (PROTONIX) 40 MG tablet  Seasonal allergic rhinitis due to pollen - Plan: cetirizine (ZYRTEC) 10 MG tablet, azelastine (ASTELIN) 0.1 % nasal spray  Screen of the vaginal vault completed today as patient has history of hysterectomy.  Cervix was surgically absent.  Blood pressure is controlled.  No changes.  Rx's have been renewed.  Labs have been ordered  Valium renewed.  Up-to-date on controlled substance contract and urine drug screen.  No red flag signs or symptoms.  Continue to use sparingly  DEXA scan ordered given postsurgical menopausal state  Check CBC given history of tobacco use and chronic PPI use  Continue PPI for GERD control.  Astelin added due to ongoing rhinorrhea despite use of nasal corticosteroid and oral antihistamine  Counseled on  healthy lifestyle choices, including diet (rich in fruits, vegetables and lean meats and low in salt and simple carbohydrates) and exercise (at least 30 minutes of moderate physical activity daily).  Patient to follow up in 1 year for annual exam or sooner if needed.  Addison Whidbee M. Lajuana Ripple, DO

## 2021-10-08 ENCOUNTER — Other Ambulatory Visit: Payer: Self-pay | Admitting: Family Medicine

## 2021-10-08 LAB — CMP14+EGFR
ALT: 11 IU/L (ref 0–32)
AST: 15 IU/L (ref 0–40)
Albumin/Globulin Ratio: 2 (ref 1.2–2.2)
Albumin: 4.4 g/dL (ref 3.9–4.9)
Alkaline Phosphatase: 76 IU/L (ref 44–121)
BUN/Creatinine Ratio: 14 (ref 12–28)
BUN: 10 mg/dL (ref 8–27)
Bilirubin Total: 0.4 mg/dL (ref 0.0–1.2)
CO2: 23 mmol/L (ref 20–29)
Calcium: 9.2 mg/dL (ref 8.7–10.3)
Chloride: 103 mmol/L (ref 96–106)
Creatinine, Ser: 0.71 mg/dL (ref 0.57–1.00)
Globulin, Total: 2.2 g/dL (ref 1.5–4.5)
Glucose: 89 mg/dL (ref 70–99)
Potassium: 3.8 mmol/L (ref 3.5–5.2)
Sodium: 140 mmol/L (ref 134–144)
Total Protein: 6.6 g/dL (ref 6.0–8.5)
eGFR: 95 mL/min/{1.73_m2} (ref >59)

## 2021-10-08 LAB — CBC
Hematocrit: 38.3 % (ref 34.0–46.6)
Hemoglobin: 12.9 g/dL (ref 11.1–15.9)
MCH: 30.9 pg (ref 26.6–33.0)
MCHC: 33.7 g/dL (ref 31.5–35.7)
MCV: 92 fL (ref 79–97)
Platelets: 279 10*3/uL (ref 150–450)
RBC: 4.18 x10E6/uL (ref 3.77–5.28)
RDW: 12.3 % (ref 11.7–15.4)
WBC: 5.1 10*3/uL (ref 3.4–10.8)

## 2021-10-08 LAB — CYTOLOGY - PAP
Adequacy: ABSENT
Comment: NEGATIVE
Diagnosis: NEGATIVE
High risk HPV: NEGATIVE

## 2021-10-08 LAB — LIPID PANEL
Chol/HDL Ratio: 3.3 ratio (ref 0.0–4.4)
Cholesterol, Total: 192 mg/dL (ref 100–199)
HDL: 59 mg/dL (ref >39)
LDL Chol Calc (NIH): 117 mg/dL — ABNORMAL HIGH (ref 0–99)
Triglycerides: 86 mg/dL (ref 0–149)
VLDL Cholesterol Cal: 16 mg/dL (ref 5–40)

## 2021-10-08 LAB — TSH: TSH: 2.13 u[IU]/mL (ref 0.450–4.500)

## 2022-04-12 ENCOUNTER — Telehealth: Payer: Self-pay | Admitting: Family Medicine

## 2022-04-12 ENCOUNTER — Ambulatory Visit: Payer: BC Managed Care – PPO | Admitting: Family Medicine

## 2022-04-12 DIAGNOSIS — K219 Gastro-esophageal reflux disease without esophagitis: Secondary | ICD-10-CM

## 2022-04-12 DIAGNOSIS — I10 Essential (primary) hypertension: Secondary | ICD-10-CM

## 2022-04-12 DIAGNOSIS — E7800 Pure hypercholesterolemia, unspecified: Secondary | ICD-10-CM

## 2022-04-12 DIAGNOSIS — J301 Allergic rhinitis due to pollen: Secondary | ICD-10-CM

## 2022-04-12 MED ORDER — LISINOPRIL 30 MG PO TABS: 30.0000 mg | ORAL_TABLET | Freq: Every day | ORAL | 0 refills | Status: DC

## 2022-04-12 MED ORDER — PRAVASTATIN SODIUM 20 MG PO TABS: 20.0000 mg | ORAL_TABLET | Freq: Every day | ORAL | 0 refills | Status: DC

## 2022-04-12 MED ORDER — CETIRIZINE HCL 10 MG PO TABS: 10.0000 mg | ORAL_TABLET | Freq: Every day | ORAL | 0 refills | Status: DC

## 2022-04-12 MED ORDER — PANTOPRAZOLE SODIUM 40 MG PO TBEC: 40.0000 mg | DELAYED_RELEASE_TABLET | Freq: Every day | ORAL | 0 refills | Status: DC

## 2022-04-12 NOTE — Telephone Encounter (Signed)
Pt could not come to apt today with [facility]. She tested pos for covid over the weekend. Pt said that she needed her rx refills. She will have pharmacy send over a request. Pt will no longer use [facility]. Please send refill to [facility]. Pt r/s apt with [facility] for 05/11/2022  pravastatin (PRAVACHOL) 20 MG tablet  lisinopril (ZESTRIL) 30 MG tablet  pantoprazole (PROTONIX) 40 MG tablet  cetirizine (ZYRTEC) 10 MG tablet

## 2022-04-12 NOTE — Telephone Encounter (Signed)
Patient aware rx sent to pharmacy.  

## 2022-04-28 NOTE — Patient Instructions (Signed)
Our records indicate that you are due for your annual mammogram/breast imaging. While there is no way to prevent breast cancer, early detection provides the best opportunity for curing it. For women over the age of 65, the [facility] recommends a yearly clinical breast exam and a yearly mammogram. These practices have saved thousands of lives. We need your help to ensure that you are receiving optimal medical care. Please call the imaging location that has done you previous mammograms. Please remember to list Whitney Ross as your primary care. This helps make sure we receive a report and can update your chart.  Below is the contact information for several local breast imaging centers. You may call the location that works best for you, and they will be happy to assistance in making you an appointment. You do not need an order for a regular screening mammogram. However, if you are having any problems or concerns with you breast area, please let your primary care provider know, and appropriate orders will be placed. Please let our office know if you have any questions or concerns. Or if you need information for another imaging center not on this list or outside of the area. We are commented to working with you on your health care journey.   The mobile unit/bus ([facility]) - they come twice a month to our location.  These appointments can be made through our office or by call [facility] [place] [facility]  [place], [place] [address] Phone [phone]  [facility] Radiology Department  [address] [place], [place] [address] [phone]  [facility] ([facility] of [place])  [phone] [address], [place] [address] [phone]  [place] [address] [place] [address] [phone]  [place]  [address] [place] [address] [phone]  [facility] Mammography in [place], [place] [address] [phone]  [facility] [place], [place] [address] [phone]  [facility] at [facility] [place]  [place],  [address] [phone]  [facility] [place] [address] [phone]

## 2022-05-11 ENCOUNTER — Ambulatory Visit: Payer: BC Managed Care – PPO | Admitting: Family Medicine

## 2022-05-11 ENCOUNTER — Telehealth: Payer: Self-pay | Admitting: Family Medicine

## 2022-05-11 ENCOUNTER — Encounter: Payer: Self-pay | Admitting: Family Medicine

## 2022-05-11 VITALS — BP 128/73 | HR 71 | Temp 98.6°F | Ht 64.0 in | Wt 198.0 lb

## 2022-05-11 DIAGNOSIS — I10 Essential (primary) hypertension: Secondary | ICD-10-CM

## 2022-05-11 DIAGNOSIS — B351 Tinea unguium: Secondary | ICD-10-CM

## 2022-05-11 DIAGNOSIS — K219 Gastro-esophageal reflux disease without esophagitis: Secondary | ICD-10-CM

## 2022-05-11 DIAGNOSIS — E7800 Pure hypercholesterolemia, unspecified: Secondary | ICD-10-CM | POA: Diagnosis not present

## 2022-05-11 DIAGNOSIS — M19041 Primary osteoarthritis, right hand: Secondary | ICD-10-CM

## 2022-05-11 DIAGNOSIS — M19042 Primary osteoarthritis, left hand: Secondary | ICD-10-CM

## 2022-05-11 MED ORDER — PANTOPRAZOLE SODIUM 40 MG PO TBEC: 40.0000 mg | DELAYED_RELEASE_TABLET | Freq: Every day | ORAL | 3 refills | Status: DC

## 2022-05-11 MED ORDER — ALBUTEROL SULFATE HFA 108 (90 BASE) MCG/ACT IN AERS: 2.0000 | INHALATION_SPRAY | Freq: Four times a day (QID) | RESPIRATORY_TRACT | 5 refills | Status: DC | PRN

## 2022-05-11 MED ORDER — DICLOFENAC SODIUM 75 MG PO TBEC: 75.0000 mg | DELAYED_RELEASE_TABLET | Freq: Two times a day (BID) | ORAL | 2 refills | Status: DC | PRN

## 2022-05-11 MED ORDER — PRAVASTATIN SODIUM 20 MG PO TABS: 20.0000 mg | ORAL_TABLET | Freq: Every day | ORAL | 3 refills | Status: DC

## 2022-05-11 MED ORDER — LISINOPRIL 30 MG PO TABS: 30.0000 mg | ORAL_TABLET | Freq: Every day | ORAL | 3 refills | Status: DC

## 2022-05-11 MED ORDER — CICLOPIROX 8 % EX SOLN: Freq: Every day | CUTANEOUS | 0 refills | Status: DC

## 2022-05-11 NOTE — Telephone Encounter (Signed)
[employer] is processing your PA request and will respond shortly with next steps. You are currently using the fastest method to process this prior authorization. Please do not fax or call [employer] to resubmit this request. To check for an update later, open this request again from your dashboard.  

## 2022-05-11 NOTE — Progress Notes (Signed)
Subjective: CC:HTN, HLD PCP: Janora Norlander, DO IH:[id number] Whitney Ross is a 67 y.o. female presenting to clinic today for:  1. HTN w/ HLD Patient reports compliance with meds.  No CP, SOB, edema, falls or headache reported  2. OA Reports bilateral hand OA.  She has used voltaren on occasional.  Points to bilateral mcp as the primary area of concern.  She is right-hand dominant.  Sometimes that she feels like she has decreased strength.  Left seems to be worse than right.  3.  Onychomycosis Patient reports fungal infection of her right middle finger nail on the right hand.  She does go to beauty salon's and gets her nails done sometimes.  Has not tried anything.  Is not going to get her nails done again after these come off.  ROS: Per HPI  Allergies  Allergen Reactions   Cortisporin [Bacitra-Neomycin-Polymyxin-Hc]    Past Medical History:  Diagnosis Date   Chronic kidney disease    hx muscle stones   Diverticulitis    Elevated cholesterol    GERD (gastroesophageal reflux disease)    Heart murmur    Hypertension    Lumbago    Neck pain    Nephrolithiasis     Current Outpatient Medications:    albuterol (VENTOLIN HFA) 108 (90 Base) MCG/ACT inhaler, Inhale 2 puffs into the lungs every 6 (six) hours as needed for wheezing or shortness of breath., Disp: 8.5 g, Rfl: 5   azelastine (ASTELIN) 0.1 % nasal spray, Place 1 spray into both nostrils 2 (two) times daily., Disp: 30 mL, Rfl: 12   cetirizine (ZYRTEC) 10 MG tablet, Take 1 tablet (10 mg total) by mouth daily., Disp: 90 tablet, Rfl: 0   diazepam (VALIUM) 5 MG tablet, Take 0.5-1 tablets (2.5-5 mg total) by mouth daily as needed for anxiety., Disp: 30 tablet, Rfl: 5   diclofenac (VOLTAREN) 75 MG EC tablet, Take 1 tablet (75 mg total) by mouth 2 (two) times daily., Disp: 50 tablet, Rfl: 2   lisinopril (ZESTRIL) 30 MG tablet, Take 1 tablet (30 mg total) by mouth daily., Disp: 90 tablet, Rfl: 0   mometasone (NASONEX) 50 MCG/ACT  nasal spray, Place 2 sprays into the nose daily. REPLACE Flonase, Disp: 1 each, Rfl: 12   pantoprazole (PROTONIX) 40 MG tablet, Take 1 tablet (40 mg total) by mouth daily., Disp: 90 tablet, Rfl: 0   pravastatin (PRAVACHOL) 20 MG tablet, Take 1 tablet (20 mg total) by mouth daily., Disp: 90 tablet, Rfl: 0 Social History   Socioeconomic History   Marital status: Married    Spouse name: Not on file   Number of children: 1   Years of education: Not on file   Highest education level: Not on file  Occupational History   Occupation: Radio broadcast assistant    Employer: [employer]  Tobacco Use   Smoking status: Every Day    Packs/day: 1    Types: Cigarettes   Smokeless tobacco: Never  Vaping Use   Vaping Use: Never used  Substance and Sexual Activity   Alcohol use: Not Currently    Comment: RARELY   Drug use: No   Sexual activity: Not Currently  Other Topics Concern   Not on file  Social History Narrative   Not on file   Social Determinants of Health   Financial Resource Strain: Not on file  Food Insecurity: Not on file  Transportation Needs: Not on file  Physical Activity: Not on file  Stress: Not on file  Social Connections: Not on file  Intimate Partner Violence: Not on file   Family History  Problem Relation Age of Onset   Heart failure Mother    Melanoma Father    Hypertension Sister    Hyperlipidemia Sister    Hyperlipidemia Brother    Hypertension Brother    Colon cancer Neg Hx    Esophageal cancer Neg Hx    Stomach cancer Neg Hx    Rectal cancer Neg Hx     Objective: Office vital signs reviewed. BP 128/73   Pulse 71   Temp 98.6 F (37 C)   Ht 5\' 4"  (1.626 m)   Wt 198 lb (89.8 kg)   SpO2 96%   BMI 33.99 kg/m   Physical Examination:  General: Awake, alert, well nourished, No acute distress HEENT: sclera white, MMM Cardio: regular rate and rhythm, S1S2 heard, no murmurs appreciated Pulm: clear to auscultation bilaterally, no wheezes, rhonchi or rales;  normal work of breathing on room air MSK: Mild osteoarthritic changes noted at the joints but no inflammatory changes.  Negative Finkelstein test bilaterally  Assessment/ Plan: 67 y.o. female   Primary osteoarthritis of both hands - Plan: diclofenac (VOLTAREN) 75 MG EC tablet  Pure hypercholesterolemia - Plan: pravastatin (PRAVACHOL) 20 MG tablet  Essential hypertension - Plan: lisinopril (ZESTRIL) 30 MG tablet  Onychomycosis - Plan: ciclopirox (PENLAC) 8 % solution  Gastroesophageal reflux disease without esophagitis - Plan: pantoprazole (PROTONIX) 40 MG tablet  Voltaren renewed for osteoarthritis.  We discussed consideration for joint injection with specialist.  When she goes on to her new insurance she will reconvene about this issue  Blood pressure controlled.  Continue current regimen.  Continue statin.  Plan for fasting labs at next visit  Penlac solution for onychomycosis.  GERD not discussed but PPI needed refills of this was sent.  No orders of the defined types were placed in this encounter.  No orders of the defined types were placed in this encounter.    Janora Norlander, DO [place] [phone]

## 2022-05-20 ENCOUNTER — Other Ambulatory Visit (HOSPITAL_COMMUNITY): Payer: Self-pay

## 2022-05-21 ENCOUNTER — Other Ambulatory Visit (HOSPITAL_COMMUNITY): Payer: Self-pay

## 2022-05-21 NOTE — Telephone Encounter (Signed)
Pharmacy Patient Advocate Encounter  Received notification from [employer] that the request for prior authorization for  Ciclopirox  has been denied due to lack of documentation.       Please be advised we currently do not have a Pharmacist to review denials, therefore you will need to process appeals accordingly as needed. Thanks for your support at this time.   You may fax [phone] to appeal.

## 2022-05-21 NOTE — Telephone Encounter (Signed)
Pharmacy Patient Advocate Encounter   Received notification that prior authorization for Ciclopirox is required/requested.  Per Test Claim: Prior authorization required   PA submitted on 05/21/22 to (ins) [employer] via CoverMyMeds Key  #  [id number] Status is pending

## 2022-05-24 NOTE — Telephone Encounter (Signed)
Pt has been informed and understood. She has no concerns at this time.

## 2022-05-24 NOTE — Telephone Encounter (Signed)
I think it's only like $15 without insurance.  She'll probs just pay it. Just inform her

## 2022-07-16 DIAGNOSIS — D225 Melanocytic nevi of trunk: Secondary | ICD-10-CM | POA: Diagnosis not present

## 2022-07-16 DIAGNOSIS — L814 Other melanin hyperpigmentation: Secondary | ICD-10-CM | POA: Diagnosis not present

## 2022-07-16 DIAGNOSIS — D485 Neoplasm of uncertain behavior of skin: Secondary | ICD-10-CM | POA: Diagnosis not present

## 2022-07-16 DIAGNOSIS — Z08 Encounter for follow-up examination after completed treatment for malignant neoplasm: Secondary | ICD-10-CM | POA: Diagnosis not present

## 2022-07-16 DIAGNOSIS — Z86007 Personal history of in-situ neoplasm of skin: Secondary | ICD-10-CM | POA: Diagnosis not present

## 2022-07-16 DIAGNOSIS — L821 Other seborrheic keratosis: Secondary | ICD-10-CM | POA: Diagnosis not present

## 2022-07-16 DIAGNOSIS — B351 Tinea unguium: Secondary | ICD-10-CM | POA: Diagnosis not present

## 2022-07-18 ENCOUNTER — Other Ambulatory Visit: Payer: Self-pay | Admitting: Family Medicine

## 2022-07-18 DIAGNOSIS — J301 Allergic rhinitis due to pollen: Secondary | ICD-10-CM

## 2022-08-04 ENCOUNTER — Ambulatory Visit: Payer: BC Managed Care – PPO | Admitting: Nurse Practitioner

## 2022-08-04 ENCOUNTER — Encounter: Payer: Self-pay | Admitting: Nurse Practitioner

## 2022-08-04 ENCOUNTER — Ambulatory Visit (INDEPENDENT_AMBULATORY_CARE_PROVIDER_SITE_OTHER): Payer: Medicare HMO | Admitting: Nurse Practitioner

## 2022-08-04 VITALS — BP 146/88 | HR 64 | Temp 97.5°F | Ht 64.0 in | Wt 197.0 lb

## 2022-08-04 DIAGNOSIS — I10 Essential (primary) hypertension: Secondary | ICD-10-CM | POA: Diagnosis not present

## 2022-08-04 NOTE — Progress Notes (Signed)
Acute Office Visit  Subjective:     Patient ID: Whitney Ross, female    DOB: 1955-08-12, 67 y.o.   MRN: [id number]  Chief Complaint  Patient presents with   Hypertension    Pt had dental work done b/p was around 200/100 before procedure.     HPI Whitney Ross 67 year old female here today to concern to increase blood pressure.  She is currently being treated with lisinopril 20 mg daily that she reports that she has been taking at night "my GYN started me on BP medication and was advised to take it at night so I never change it".  Advised her to start taking her daily medication in the morning. Reports that she was at  I saw the dentist to have a tooth pulled and  BP was running high  " it was 200-110, it went down a little but not much". Denies HA, change in vision or blurry.  1st BP 159/90, 2nd BP manual 146/88. She has been eating more junk food lately. BP log sheet provided to she is instructed to check her BP BID and to bring the log to her f/u in Aug. Since she is asymptomatic no medication changes today, advised on decreasing sodium intake 6-8 well-balanced diet, more fruits and vegetable ROS Negative unless indicated in HPI    Objective:    BP (!) 146/88   Pulse 64   Temp (!) 97.5 F (36.4 C) (Temporal)   Ht 5\' 4"  (1.626 m)   Wt 197 lb (89.4 kg)   SpO2 99%   BMI 33.81 kg/m  BP Readings from Last 3 Encounters:  08/04/22 (!) 146/88  05/11/22 128/73  10/07/21 133/69   Wt Readings from Last 3 Encounters:  08/04/22 197 lb (89.4 kg)  05/11/22 198 lb (89.8 kg)  10/07/21 203 lb (92.1 kg)      Physical Exam Vitals and nursing note reviewed.  Constitutional:      General: She is not in acute distress.    Appearance: Normal appearance. She is overweight.  HENT:     Head: Normocephalic and atraumatic.  Cardiovascular:     Rate and Rhythm: Normal rate.     Heart sounds: Normal heart sounds.  Pulmonary:     Effort: Pulmonary effort is normal.     Breath  sounds: Normal breath sounds.  Musculoskeletal:        General: Normal range of motion.     Right lower leg: No edema.     Left lower leg: No edema.  Skin:    General: Skin is warm and dry.     Findings: No rash.  Neurological:     General: No focal deficit present.     Mental Status: She is alert and oriented to person, place, and time. Mental status is at baseline.  Psychiatric:        Mood and Affect: Mood normal.        Behavior: Behavior normal.        Thought Content: Thought content normal.     No results found for any visits on 08/04/22.      Assessment & Plan:  Primary hypertension  Plan  Increase BP -Continue lisinopril 30 mg in the morning -Avoid food high in sodium -Advised on DASH diet -BP log provided to the client and instructed to check BP twice a day and to bring him back at her next follow-up appointment in August for possible medication adjustment.  Return for As already  scheduled in August.  Martina Sinner DNP

## 2022-08-05 ENCOUNTER — Encounter: Payer: Self-pay | Admitting: Nurse Practitioner

## 2022-08-05 ENCOUNTER — Ambulatory Visit (INDEPENDENT_AMBULATORY_CARE_PROVIDER_SITE_OTHER): Payer: Medicare HMO | Admitting: Nurse Practitioner

## 2022-08-05 VITALS — BP 177/94 | HR 66 | Temp 96.8°F | Resp 16 | Ht 65.0 in | Wt 197.0 lb

## 2022-08-05 DIAGNOSIS — J0100 Acute maxillary sinusitis, unspecified: Secondary | ICD-10-CM | POA: Diagnosis not present

## 2022-08-05 DIAGNOSIS — I10 Essential (primary) hypertension: Secondary | ICD-10-CM

## 2022-08-05 MED ORDER — AZITHROMYCIN 250 MG PO TABS: ORAL_TABLET | ORAL | 0 refills | Status: DC

## 2022-08-05 MED ORDER — LISINOPRIL-HYDROCHLOROTHIAZIDE 20-12.5 MG PO TABS: 2.0000 | ORAL_TABLET | Freq: Every day | ORAL | 1 refills | Status: DC

## 2022-08-05 NOTE — Patient Instructions (Signed)

## 2022-08-05 NOTE — Progress Notes (Signed)
Subjective:    Patient ID: DESHONA TESCHNER, female    DOB: November 29, 1955, 67 y.o.   MRN: [id number]   Chief Complaint: Hypertension   Patient come sin c/o elevated blood pressure. She was seen in office yesterday but no changes were made to plan of care. She was taking her lisinopril at night and she was told to take in the mornings instead and keep diary of blood pressure at home. Follow up in August.  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  Negative for diaphoresis.  Eyes:  Negative for pain.  Respiratory:  Negative for shortness of breath.   Cardiovascular:  Negative for chest pain, palpitations and leg swelling.  Gastrointestinal:  Negative for abdominal pain.  Endocrine: Negative for polydipsia.  Skin:  Negative for rash.  Neurological:  Negative for dizziness, weakness and headaches.  Hematological:  Does not bruise/bleed easily.  All other systems reviewed and are negative.      Objective:   Physical Exam Vitals reviewed.  Constitutional:      Appearance: Normal appearance. She is obese.  Cardiovascular:     Rate and Rhythm: Normal rate and regular rhythm.     Heart sounds: Normal heart sounds.  Pulmonary:      Effort: Pulmonary effort is normal.     Breath sounds: Normal breath sounds.  Musculoskeletal:     Right lower leg: Edema (1+) present.     Left lower leg: Edema (1+) present.  Skin:    General: Skin is warm.  Neurological:     General: No focal deficit present.     Mental Status: She is alert and oriented to person, place, and time.  Psychiatric:        Mood and Affect: Mood normal.        Behavior: Behavior normal.     BP (!) 177/94   Pulse 66   Temp (!) 96.8 F (36 C)   Resp 16   Ht 5\' 5"  (1.651 m)   Wt 197 lb (89.4 kg)   SpO2 97%   BMI 32.78 kg/m        Assessment & Plan:   [name] in today with chief complaint of Hypertension   1. Primary hypertension Dash diet - CMP14+EGFR - lisinopril-hydrochlorothiazide (ZESTORETIC) 20-12.5 MG tablet; Take 2 tablets by mouth daily.  Dispense: 180 tablet; Refill: 1  2. Acute non-recurrent maxillary sinusitis 1. Take meds as prescribed 2. Use a cool mist humidifier especially during the winter months and when heat has been humid. 3. Use saline nose sprays frequently 4. Saline irrigations of the nose can be very helpful if done frequently.  * 4X daily for 1 week*  * Use of a nettie pot can be helpful with this. Follow directions  with this* 5. Drink plenty of fluids 6. Keep thermostat turn down low 7.For any cough or congestion- mucinex plain 8. For fever or aces or pains- take tylenol or ibuprofen appropriate for age and weight.  * for fevers greater than 101 orally you may alternate ibuprofen and tylenol every  3 hours.    - azithromycin (ZITHROMAX Z-PAK) 250 MG tablet; As directed  Dispense: 6 tablet; Refill: 0    The above assessment and management plan was discussed with the patient. The patient verbalized understanding of and has agreed to the management plan. Patient is aware to call the clinic if symptoms persist or worsen. Patient is aware when to return to the clinic for a follow-up visit. Patient  educated on when it is appropriate to go to the emergency department.   Mary-Margaret Daphine Deutscher, FNP

## 2022-08-11 ENCOUNTER — Other Ambulatory Visit: Payer: Self-pay | Admitting: Family Medicine

## 2022-08-11 DIAGNOSIS — M19041 Primary osteoarthritis, right hand: Secondary | ICD-10-CM

## 2022-08-12 ENCOUNTER — Encounter: Payer: Self-pay | Admitting: Family Medicine

## 2022-08-13 ENCOUNTER — Other Ambulatory Visit: Payer: Self-pay | Admitting: Family Medicine

## 2022-08-13 DIAGNOSIS — E7800 Pure hypercholesterolemia, unspecified: Secondary | ICD-10-CM

## 2022-08-13 DIAGNOSIS — I10 Essential (primary) hypertension: Secondary | ICD-10-CM

## 2022-08-18 DIAGNOSIS — C44712 Basal cell carcinoma of skin of right lower limb, including hip: Secondary | ICD-10-CM | POA: Diagnosis not present

## 2022-08-19 DIAGNOSIS — H52221 Regular astigmatism, right eye: Secondary | ICD-10-CM | POA: Diagnosis not present

## 2022-08-19 DIAGNOSIS — H524 Presbyopia: Secondary | ICD-10-CM | POA: Diagnosis not present

## 2022-10-15 ENCOUNTER — Ambulatory Visit (INDEPENDENT_AMBULATORY_CARE_PROVIDER_SITE_OTHER): Payer: Medicare HMO | Admitting: Family Medicine

## 2022-10-15 ENCOUNTER — Encounter: Payer: Self-pay | Admitting: Family Medicine

## 2022-10-15 VITALS — HR 66 | Temp 98.4°F | Ht 65.0 in | Wt 186.0 lb

## 2022-10-15 DIAGNOSIS — K219 Gastro-esophageal reflux disease without esophagitis: Secondary | ICD-10-CM | POA: Diagnosis not present

## 2022-10-15 DIAGNOSIS — Z23 Encounter for immunization: Secondary | ICD-10-CM

## 2022-10-15 DIAGNOSIS — Z0000 Encounter for general adult medical examination without abnormal findings: Secondary | ICD-10-CM

## 2022-10-15 DIAGNOSIS — J301 Allergic rhinitis due to pollen: Secondary | ICD-10-CM | POA: Diagnosis not present

## 2022-10-15 DIAGNOSIS — Z0001 Encounter for general adult medical examination with abnormal findings: Secondary | ICD-10-CM

## 2022-10-15 DIAGNOSIS — E7800 Pure hypercholesterolemia, unspecified: Secondary | ICD-10-CM

## 2022-10-15 DIAGNOSIS — G4719 Other hypersomnia: Secondary | ICD-10-CM

## 2022-10-15 DIAGNOSIS — F17210 Nicotine dependence, cigarettes, uncomplicated: Secondary | ICD-10-CM | POA: Diagnosis not present

## 2022-10-15 DIAGNOSIS — I10 Essential (primary) hypertension: Secondary | ICD-10-CM

## 2022-10-15 DIAGNOSIS — Z114 Encounter for screening for human immunodeficiency virus [HIV]: Secondary | ICD-10-CM | POA: Diagnosis not present

## 2022-10-15 DIAGNOSIS — Z1159 Encounter for screening for other viral diseases: Secondary | ICD-10-CM | POA: Diagnosis not present

## 2022-10-15 DIAGNOSIS — R7303 Prediabetes: Secondary | ICD-10-CM | POA: Diagnosis not present

## 2022-10-15 LAB — BAYER DCA HB A1C WAIVED: HB A1C (BAYER DCA - WAIVED): 5.5 % (ref 4.8–5.6)

## 2022-10-15 NOTE — Patient Instructions (Addendum)
Thank you for coming in today for your Annual Medicare Wellness Visit.  Things that we discussed today are included in this packet.  Create and/or bring a copy of your Living Will/ Advanced Directive into the office so that we may respect your wishes should an emergency occur.  Discuss your code status with your son.  If you are not a FULL CODE (want CPR/ resuscitation), let me know. I need to complete special documents for you.  Get the recommended life-saving vaccines we discussed today.  Get your mammogram/ colonoscopy/ DEXA scans as directed by your provider.  Make sure that your medications are organized and safely stored.  Remember to always ask for help if you forget when/ how to take your medications.  Make healthy food choices (Rich in fruits/ veggies/ lean meats and low in salt, sugar and fat)  Do something that you enjoy for at least 30 minutes every day to stay active (walking, gardening, swimming, etc). This will help you lower your risk of falls/ broken bones.  Be social, do puzzles/ crosswords.  These things help the mind stay young and lower your risk of developing dementia.  Make sure that your home is safe by checking your smoke detectors regularly and doing the things outlined below to lower your risk of falls.

## 2022-10-16 ENCOUNTER — Encounter: Payer: Self-pay | Admitting: Family Medicine

## 2022-10-16 LAB — CMP14+EGFR
ALT: 13 IU/L (ref 0–32)
AST: 19 IU/L (ref 0–40)
Albumin: 4.2 g/dL (ref 3.9–4.9)
Alkaline Phosphatase: 64 IU/L (ref 44–121)
BUN/Creatinine Ratio: 12 (ref 12–28)
BUN: 10 mg/dL (ref 8–27)
Bilirubin Total: 0.5 mg/dL (ref 0.0–1.2)
CO2: 26 mmol/L (ref 20–29)
Calcium: 9.3 mg/dL (ref 8.7–10.3)
Chloride: 102 mmol/L (ref 96–106)
Creatinine, Ser: 0.83 mg/dL (ref 0.57–1.00)
Globulin, Total: 2.5 g/dL (ref 1.5–4.5)
Glucose: 88 mg/dL (ref 70–99)
Potassium: 4.4 mmol/L (ref 3.5–5.2)
Sodium: 139 mmol/L (ref 134–144)
Total Protein: 6.7 g/dL (ref 6.0–8.5)
eGFR: 78 mL/min/{1.73_m2} (ref >59)

## 2022-10-16 LAB — CBC WITH DIFFERENTIAL/PLATELET
Basophils Absolute: 0.1 10*3/uL (ref 0.0–0.2)
Basos: 1 %
EOS (ABSOLUTE): 0.1 10*3/uL (ref 0.0–0.4)
Eos: 2 %
Hematocrit: 38.4 % (ref 34.0–46.6)
Hemoglobin: 12.7 g/dL (ref 11.1–15.9)
Immature Grans (Abs): 0 10*3/uL (ref 0.0–0.1)
Immature Granulocytes: 0 %
Lymphocytes Absolute: 2.3 10*3/uL (ref 0.7–3.1)
Lymphs: 41 %
MCH: 30.7 pg (ref 26.6–33.0)
MCHC: 33.1 g/dL (ref 31.5–35.7)
MCV: 93 fL (ref 79–97)
Monocytes Absolute: 0.4 10*3/uL (ref 0.1–0.9)
Monocytes: 7 %
Neutrophils Absolute: 2.7 10*3/uL (ref 1.4–7.0)
Neutrophils: 49 %
Platelets: 266 10*3/uL (ref 150–450)
RBC: 4.14 x10E6/uL (ref 3.77–5.28)
RDW: 12.7 % (ref 11.7–15.4)
WBC: 5.5 10*3/uL (ref 3.4–10.8)

## 2022-10-16 LAB — LIPID PANEL
Chol/HDL Ratio: 3.1 ratio (ref 0.0–4.4)
Cholesterol, Total: 210 mg/dL — ABNORMAL HIGH (ref 100–199)
HDL: 68 mg/dL (ref >39)
LDL Chol Calc (NIH): 130 mg/dL — ABNORMAL HIGH (ref 0–99)
Triglycerides: 70 mg/dL (ref 0–149)
VLDL Cholesterol Cal: 12 mg/dL (ref 5–40)

## 2022-10-16 LAB — HIV ANTIBODY (ROUTINE TESTING W REFLEX): HIV Screen 4th Generation wRfx: NONREACTIVE

## 2022-10-16 LAB — HEPATITIS C ANTIBODY: Hep C Virus Ab: NONREACTIVE

## 2022-10-16 MED ORDER — PRAVASTATIN SODIUM 20 MG PO TABS
20.0000 mg | ORAL_TABLET | Freq: Every day | ORAL | 3 refills | Status: DC
Start: 2022-10-16 — End: 2023-05-24

## 2022-10-16 MED ORDER — PANTOPRAZOLE SODIUM 40 MG PO TBEC
40.0000 mg | DELAYED_RELEASE_TABLET | Freq: Every day | ORAL | 3 refills | Status: DC
Start: 2022-10-16 — End: 2023-05-24

## 2022-10-16 MED ORDER — AZELASTINE HCL 0.1 % NA SOLN
1.0000 | Freq: Two times a day (BID) | NASAL | 12 refills | Status: AC
Start: 2022-10-16 — End: ?

## 2022-10-16 MED ORDER — CETIRIZINE HCL 10 MG PO TABS
10.0000 mg | ORAL_TABLET | Freq: Every day | ORAL | 2 refills | Status: DC
Start: 2022-10-16 — End: 2023-05-24

## 2022-10-16 NOTE — Progress Notes (Signed)
Subjective:    Whitney Ross is a 67 y.o. female who presents for a Welcome to Medicare exam.   Review of Systems Had some dizziness associated with change of her blood pressure medication to hydrochlorothiazide with lisinopril.  She subsequently discontinued the Zestoretic and has gone back to her 30 mg of lisinopril.  Not checking blood pressures at home.  She still has occasional positional dizziness and admits that she does not hydrate well.  She may be drinks 2 bottles of water per day.  She is eating a balanced diet now and has eliminated soda intake from her diet totally.  She eats no red meat nor any fried foods and really avoids salt entirely.  Cardiac Risk Factors include: hypertension;obesity (BMI >30kg/m2)      Objective:    Today's Vitals   10/15/22 1030  Pulse: 66  Temp: 98.4 F (36.9 C)  SpO2: 95%  Weight: 186 lb (84.4 kg)  Height: 5\' 5"  (1.651 m)  PainSc: 0-No pain  Body mass index is 30.95 kg/m.  Medications Outpatient Encounter Medications as of 10/15/2022  Medication Sig   albuterol (VENTOLIN HFA) 108 (90 Base) MCG/ACT inhaler Inhale 2 puffs into the lungs every 6 (six) hours as needed for wheezing or shortness of breath.   azelastine (ASTELIN) 0.1 % nasal spray Place 1 spray into both nostrils 2 (two) times daily.   cetirizine (ZYRTEC) 10 MG tablet TAKE 1 TABLET BY MOUTH EVERY DAY   ciclopirox (PENLAC) 8 % solution Apply topically at bedtime. Apply over nail and surrounding skin. Apply daily over previous coat. After seven (7) days, may remove with alcohol and continue cycle.   diazepam (VALIUM) 5 MG tablet Take 0.5-1 tablets (2.5-5 mg total) by mouth daily as needed for anxiety.   diclofenac (VOLTAREN) 75 MG EC tablet TAKE 1 TABLET (75 MG TOTAL) BY MOUTH 2 (TWO) TIMES DAILY AS NEEDED FOR MODERATE PAIN.   lisinopril (ZESTRIL) 30 MG tablet Take 30 mg by mouth daily. Taking at night   mometasone (NASONEX) 50 MCG/ACT nasal spray Place 2 sprays into the nose  daily. REPLACE Flonase   pantoprazole (PROTONIX) 40 MG tablet Take 1 tablet (40 mg total) by mouth daily.   pravastatin (PRAVACHOL) 20 MG tablet Take 1 tablet (20 mg total) by mouth daily.   [DISCONTINUED] azithromycin (ZITHROMAX Z-PAK) 250 MG tablet As directed   [DISCONTINUED] lisinopril-hydrochlorothiazide (ZESTORETIC) 20-12.5 MG tablet Take 2 tablets by mouth daily. (Patient not taking: Reported on 10/15/2022)   No facility-administered encounter medications on file as of 10/15/2022.     History: Past Medical History:  Diagnosis Date   Chronic kidney disease    hx muscle stones   Diverticulitis    Elevated cholesterol    GERD (gastroesophageal reflux disease)    Heart murmur    Hypertension    Lumbago    Neck pain    Nephrolithiasis    Past Surgical History:  Procedure Laterality Date   BELPHAROPTOSIS REPAIR Bilateral 06/24/2021   Dr Juan Quam   CESAREAN SECTION  1987   CYSTOSCOPY     KNEE ARTHROSCOPY W/ MENISCAL REPAIR Left    RECONSTRUCTION OF EYELID     VAGINAL HYSTERECTOMY  1997   TOTAL VAGINAL    Family History  Problem Relation Age of Onset   Heart failure Mother    Melanoma Father    Hypertension Sister    Hyperlipidemia Sister    Hyperlipidemia Brother    Hypertension Brother    Colon cancer Neg  Hx    Esophageal cancer Neg Hx    Stomach cancer Neg Hx    Rectal cancer Neg Hx    Social History   Occupational History   Occupation: IT sales professional: [employer]  Tobacco Use   Smoking status: Every Day    Current packs/day: 0.50    Average packs/day: 1 pack/day for 45.6 years (44.3 ttl pk-yrs)    Types: Cigarettes    Start date: 1979   Smokeless tobacco: Never  Vaping Use   Vaping status: Never Used  Substance and Sexual Activity   Alcohol use: Not Currently    Comment: RARELY   Drug use: No   Sexual activity: Not Currently    Tobacco Counseling Ready to quit: Not Answered Counseling given: Not Answered   Immunizations and  Health Maintenance Immunization History  Administered Date(s) Administered   Influenza Split 03/06/2011   Zoster Recombinant(Shingrix) 10/15/2022   Health Maintenance Due  Topic Date Due   Lung Cancer Screening  Never done   MAMMOGRAM  07/26/2014    Activities of Daily Living    10/15/2022   10:38 AM  In your present state of health, do you have any difficulty performing the following activities:  Hearing? 0  Vision? 0  Difficulty concentrating or making decisions? 0  Walking or climbing stairs? 0  Dressing or bathing? 0  Doing errands, shopping? 0  Preparing Food and eating ? N  Using the Toilet? N  In the past six months, have you accidently leaked urine? N  Do you have problems with loss of bowel control? N  Managing your Medications? N  Managing your Finances? N  Housekeeping or managing your Housekeeping? N    Physical Exam   General: Well-appearing female in no acute distress HEENT: Sclera white.  Moist because membranes.  TMs intact bilaterally.  No oropharyngeal masses Neck: No lymphadenopathy.  No thyromegaly Heart: Regular rate and rhythm.  S1, S2 heard.  No murmurs Pulm: Clear to auscultation bilaterally.  No wheezes, rhonchi or rales appreciated Abdomen: Soft, nontender, nondistended. Extremities: No edema.  Warm, well-perfused Psych: Mood stable, speech normal, affect appropriate.  Pleasant, interactive  Advanced Directives: Does Patient Have a Medical Advance Directive?: No Would patient like information on creating a medical advance directive?: Yes (Inpatient - patient requests chaplain consult to create a medical advance directive)    Assessment:    This is a routine wellness examination for this patient .   Vision/Hearing screen Recently had eye exam and has glasses.  No hearing concerns  Dietary issues and exercise activities discussed:  Continue high-fiber, low-fat, low-salt diet.   Goals      Exercise 150 min/wk Moderate Activity        Depression Screen    10/15/2022   10:41 AM 08/05/2022    8:26 AM 08/04/2022    9:07 AM 05/11/2022    3:24 PM  PHQ 2/9 Scores  PHQ - 2 Score 0 0 0 0  PHQ- 9 Score  0 0 0     Fall Risk    10/15/2022   10:44 AM  Fall Risk   Falls in the past year? 0  Number falls in past yr: 0  Injury with Fall? 0  Risk for fall due to : No Fall Risks  Follow up Education provided    Cognitive Function:    10/15/2022   10:42 AM  MMSE - Mini Mental State Exam  Orientation to time 5  Orientation to  Place 5  Registration 3  Attention/ Calculation 5  Recall 3  Language- name 2 objects 2  Language- repeat 1  Language- follow 3 step command 3  Language- read & follow direction 1  Write a sentence 1  Copy design 1  Total score 30        10/15/2022   10:42 AM  6CIT Screen  What Year? 0 points  What month? 0 points  What time? 0 points  Count back from 20 0 points  Months in reverse 0 points  Repeat phrase 0 points  Total Score 0 points    Patient Care Team: Raliegh Ip, DO as PCP - General (Family Medicine) Rachael Fee, MD as Attending Physician (Gastroenterology) Corky Crafts, MD as Consulting Physician (Cardiology)     Plan:     Welcome to Medicare preventive visit - Plan: EKG 12-Lead, Zoster Recombinant (Shingrix )  Essential hypertension - Plan: EKG 12-Lead, CMP14+EGFR  Heavy tobacco smoker >10 cigarettes per day - Plan: CBC with Differential, Ambulatory Referral Lung Cancer Screening [facility]  Pure hypercholesterolemia - Plan: EKG 12-Lead, Lipid Panel, CMP14+EGFR  Pre-diabetes - Plan: Bayer DCA Hb A1c Waived  Screening for HIV (human immunodeficiency virus) - Plan: HIV Antibody (routine testing w rflx)  Encounter for hepatitis C screening test for low risk patient - Plan: Hepatitis C Antibody  EKG obtained and there were some flipped T waves in a couple of leads.  Has an appoint with cardiology coming up.  Will CC results to them.   Shingles vaccination administered.  Fasting labs collected.  Referral to lung cancer screening given ongoing tobacco use and greater than 30-pack-year history.  She was given advanced directive packet and I encouraged her to discuss CODE STATUS with her son.  She will schedule her mammogram.   I have personally reviewed and noted the following in the patient's chart:   Medical and social history Use of alcohol, tobacco or illicit drugs  Current medications and supplements Functional ability and status Nutritional status Physical activity Advanced directives List of other physicians Hospitalizations, surgeries, and ER visits in previous 12 months Vitals Screenings to include cognitive, depression, and falls Referrals and appointments  In addition, I have reviewed and discussed with patient certain preventive protocols, quality metrics, and best practice recommendations. A written personalized care plan for preventive services as well as general preventive health recommendations were provided to patient.     Delynn Flavin, DO 10/16/2022

## 2022-10-28 ENCOUNTER — Other Ambulatory Visit: Payer: Self-pay | Admitting: Family Medicine

## 2022-10-28 NOTE — Telephone Encounter (Signed)
  Prescription Request  10/28/2022  What is the name of the medication or equipment? LISINOPRIL  Have you contacted your pharmacy to request a refill? YES  Which pharmacy would you like this sent to? [facility]  Refills were supposed to be sent in at her last visit and were not. Pt is out of medicine and needs refills.    Patient notified that their request is being sent to the clinical staff for review and that they should receive a response within 2 business days.

## 2022-10-29 MED ORDER — LISINOPRIL 30 MG PO TABS: 30.0000 mg | ORAL_TABLET | Freq: Every day | ORAL | 3 refills | Status: DC

## 2022-11-08 ENCOUNTER — Ambulatory Visit: Payer: Medicare HMO | Attending: Interventional Cardiology | Admitting: Interventional Cardiology

## 2022-11-08 ENCOUNTER — Encounter: Payer: Self-pay | Admitting: Interventional Cardiology

## 2022-11-08 VITALS — BP 150/100 | HR 73 | Ht 65.0 in | Wt 188.4 lb

## 2022-11-08 DIAGNOSIS — Z720 Tobacco use: Secondary | ICD-10-CM

## 2022-11-08 DIAGNOSIS — I10 Essential (primary) hypertension: Secondary | ICD-10-CM

## 2022-11-08 DIAGNOSIS — E785 Hyperlipidemia, unspecified: Secondary | ICD-10-CM | POA: Diagnosis not present

## 2022-11-08 MED ORDER — NICOTINE 14 MG/24HR TD PT24: 14.0000 mg | MEDICATED_PATCH | Freq: Every day | TRANSDERMAL | 2 refills | Status: AC

## 2022-11-08 NOTE — Patient Instructions (Addendum)
Medication Instructions:  Your physician has recommended you make the following change in your medication:  Start nicotine 14 mg patch.  Apply to skin daily   *If you need a refill on your cardiac medications before your next appointment, please call your pharmacy*   Lab Work: none If you have labs (blood work) drawn today and your tests are completely normal, you will receive your results only by: MyChart Message (if you have MyChart) OR A paper copy in the mail If you have any lab test that is abnormal or we need to change your treatment, we will call you to review the results.   Testing/Procedures: Dr Eldridge Dace recommends you have a Calcium Score CT Scan   Follow-Up: At [facility], you and your health needs are our priority.  As part of our continuing mission to provide you with exceptional heart care, we have created designated Provider Care Teams.  These Care Teams include your primary Cardiologist (physician) and Advanced Practice Providers (APPs -  Physician Assistants and Nurse Practitioners) who all work together to provide you with the care you need, when you need it.  We recommend signing up for the patient portal called "MyChart".  Sign up information is provided on this After Visit Summary.  MyChart is used to connect with patients for Virtual Visits (Telemedicine).  Patients are able to view lab/test results, encounter notes, upcoming appointments, etc.  Non-urgent messages can be sent to your provider as well.   To learn more about what you can do with MyChart, go to [email or web address].    Your next appointment:   12 month(s)  Provider:      Other Instructions  I would be happy to help recommend someone for you to see in the future who will take excellent care of you.  If you prefer to stay at our [facility] [address] office, I am referring my patient's to Drs. Skains and  Ector,   If you prefer to go to our [place] office near [facility], I  recommend seeing Drs. Cindie Crumbly and Arpelar.  If you prefer to go to our [place] office I recommend Dr. Cristal Deer and Dr. Duke Salvia   Once you choose your provider, feel free to call their office to schedule!  Wishing you all the best!  Sincerely,  Everette Rank, MD  High-Fiber Eating Plan Fiber, also called dietary fiber, is found in foods such as fruits, vegetables, whole grains, and beans. A high-fiber diet can be good for your health. Your health care provider may recommend a high-fiber diet to help: Prevent trouble pooping (constipation). Lower your cholesterol. Treat the following conditions: Hemorrhoids. This is inflammation of veins in the anus. Inflammation of specific areas of the digestive tract. Irritable bowel syndrome (IBS). This is a problem of the large intestine, also called the colon, that sometimes causes belly pain and bloating. Prevent overeating as part of a weight-loss plan. Lower the risk of heart disease, type 2 diabetes, and certain cancers. What are tips for following this plan? Reading food labels  Check the nutrition facts label on foods for the amount of dietary fiber. Choose foods that have 4 grams of fiber or more per serving. The recommended goals for how much fiber you should eat each day include: Males 41 years old or younger: 30-34 g. Males over 20 years old: 28-34 g. Females 39 years old or younger: 25-28 g. Females over 82 years old: 22-25 g. Your daily fiber goal is _____________ g. Shopping Choose whole fruits  and vegetables instead of processed. For example, choose apples instead of apple juice or applesauce. Choose a variety of high-fiber foods such as avocados, lentils, oats, and pinto beans. Read the nutrition facts label on foods. Check for foods with added fiber. These foods often have high sugar and salt (sodium) amounts per serving. Cooking Use whole-grain flour for baking and cooking. Cook with brown rice instead of  white rice. Make meals that have a lot of beans and vegetables in them, such as chili or vegetable-based soups. Meal planning Start the day with a breakfast that is high in fiber, such as a cereal that has 5 g of fiber or more per serving. Eat breads and cereals that are made with whole-grain flour instead of refined flour or white flour. Eat brown rice, bulgur wheat, or millet instead of white rice. Use beans in place of meat in soups, salads, and pasta dishes. Be sure that half of the grains you eat each day are whole grains. General information You can get the recommended amount of dietary fiber by: Eating a variety of fruits, vegetables, grains, nuts, and beans. Taking a fiber supplement if you aren't able to eat enough fiber. It's better to get fiber through food than from a supplement. Slowly increase how much fiber you eat. If you increase the amount of fiber you eat too quickly, you may have bloating, cramping, or gas. Drink plenty of water to help you digest fiber. Choose high-fiber snacks, such as berries, raw vegetables, nuts, and popcorn. What foods should I eat? Fruits Berries. Pears. Apples. Oranges. Avocado. Prunes and raisins. Dried figs. Vegetables Sweet potatoes. Spinach. Kale. Artichokes. Cabbage. Broccoli. Cauliflower. Green peas. Carrots. Squash. Grains Whole-grain breads. Multigrain cereal. Oats and oatmeal. Brown rice. Barley. Bulgur wheat. Millet. Quinoa. Bran muffins. Popcorn. Rye wafer crackers. Meats and other proteins Navy beans, kidney beans, and pinto beans. Soybeans. Split peas. Lentils. Nuts and seeds. Dairy Fiber-fortified yogurt. Fortified means that fiber has been added to the product. Beverages Fiber-fortified soy milk. Fiber-fortified orange juice. Other foods Fiber bars. The items listed above may not be all the foods and drinks you can have. Talk to a dietitian to learn more. What foods should I avoid? Fruits Fruit juice. Cooked, strained  fruit. Vegetables Fried potatoes. Canned vegetables. Well-cooked vegetables. Grains White bread. Pasta made with refined flour. White rice. Meats and other proteins Fatty meat. Fried chicken or fried fish. Dairy Milk. Cream cheese. Sour cream. Fats and oils Butters. Beverages Soft drinks. Other foods Cakes and pastries. The items listed above may not be all the foods and drinks you should avoid. Talk to a dietitian to learn more. This information is not intended to replace advice given to you by your health care provider. Make sure you discuss any questions you have with your health care provider. Document Revised: 05/03/2022 Document Reviewed: 05/03/2022 Elsevier Patient Education  2024 [employer].

## 2022-11-08 NOTE — Progress Notes (Signed)
Cardiology Office Note   Date:  11/08/2022   ID:  Whitney Ross, DOB 29-Aug-1955, MRN [id number]  PCP:  Whitney Ip, DO    No chief complaint on file. HTN   Wt Readings from Last 3 Encounters:  11/08/22 188 lb 6.4 oz (85.5 kg)  10/15/22 186 lb (84.4 kg)  08/05/22 197 lb (89.4 kg)       History of Present Illness: Whitney Ross is a 67 y.o. female who is being seen today for the evaluation of HTN at the request of Whitney Ross, Whitney M, DO.   High blood pressure for about 10 years.  Been on medicines for about 10 years with well-controlled blood pressure.  However in June 2024, Blood pressures started going up to much higher readings in the 200s systolic.  No symptoms associated.  Smart watch was showing heart rate readings in the 190s.  She could not feel palpitations.  She may have had some dizziness when standing up.    In June lisinopril/HCTZ 20 mg BID, was added and blood pressure went too low.  Still low so went back to lisinopril 30 mg daily, without diuretic.  Home readings have been in the 130/80s.   Denies : Chest pain. Dizziness. Leg edema. Nitroglycerin use. Orthopnea. Palpitations. Paroxysmal nocturnal dyspnea. Shortness of breath. Syncope.    Work is her most strenuous activity, lot of starting and stopping.  Statin intolerant to lipitor and Crestor.    Past Medical History:  Diagnosis Date   Chronic kidney disease    hx muscle stones   Diverticulitis    Elevated cholesterol    GERD (gastroesophageal reflux disease)    Heart murmur    Hypertension    Lumbago    Neck pain    Nephrolithiasis     Past Surgical History:  Procedure Laterality Date   BELPHAROPTOSIS REPAIR Bilateral 06/24/2021   Dr Whitney Ross   CESAREAN SECTION  1987   CYSTOSCOPY     KNEE ARTHROSCOPY W/ MENISCAL REPAIR Left    RECONSTRUCTION OF EYELID     VAGINAL HYSTERECTOMY  1997   TOTAL VAGINAL     Current Outpatient Medications  Medication Sig Dispense Refill    albuterol (VENTOLIN HFA) 108 (90 Base) MCG/ACT inhaler Inhale 2 puffs into the lungs every 6 (six) hours as needed for wheezing or shortness of breath. 8.5 g 5   azelastine (ASTELIN) 0.1 % nasal spray Place 1 spray into both nostrils 2 (two) times daily. 30 mL 12   cetirizine (ZYRTEC) 10 MG tablet Take 1 tablet (10 mg total) by mouth daily. 90 tablet 2   ciclopirox (PENLAC) 8 % solution Apply topically at bedtime. Apply over nail and surrounding skin. Apply daily over previous coat. After seven (7) days, may remove with alcohol and continue cycle. 6.6 mL 0   diazepam (VALIUM) 5 MG tablet Take 0.5-1 tablets (2.5-5 mg total) by mouth daily as needed for anxiety. 30 tablet 5   diclofenac (VOLTAREN) 75 MG EC tablet TAKE 1 TABLET (75 MG TOTAL) BY MOUTH 2 (TWO) TIMES DAILY AS NEEDED FOR MODERATE PAIN. 180 tablet 0   lisinopril (ZESTRIL) 30 MG tablet Take 1 tablet (30 mg total) by mouth daily. Taking at night 90 tablet 3   mometasone (NASONEX) 50 MCG/ACT nasal spray Place 2 sprays into the nose daily. REPLACE Flonase 1 each 12   pantoprazole (PROTONIX) 40 MG tablet Take 1 tablet (40 mg total) by mouth daily. 90 tablet 3   pravastatin (  PRAVACHOL) 20 MG tablet Take 1 tablet (20 mg total) by mouth daily. 90 tablet 3   No current facility-administered medications for this visit.    Allergies:   Cortisporin [bacitra-neomycin-polymyxin-hc]    Social History:  The patient  reports that she has been smoking cigarettes. She started smoking about 45 years ago. She has a 44.4 pack-year smoking history. She has never used smokeless tobacco. She reports that she does not currently use alcohol. She reports that she does not use drugs.   Family History:  The patient's family history includes Heart failure in her mother; Hyperlipidemia in her brother and sister; Hypertension in her brother and sister; Melanoma in her father.    ROS:  Please see the history of present illness.   Otherwise, review of systems are  positive for back pain which has limited exercise.   All other systems are reviewed and negative.    PHYSICAL EXAM: VS:  BP (!) 150/100 Comment: Right arm 140/100  Pulse 73   Ht 5\' 5"  (1.651 Ross)   Wt 188 lb 6.4 oz (85.5 kg)   SpO2 98%   BMI 31.35 kg/Ross  , BMI Body mass index is 31.35 kg/Ross. GEN: Well nourished, well developed, in no acute distress HEENT: normal Neck: no JVD, carotid bruits, or masses Cardiac: RRR; no murmurs, rubs, or gallops,no edema  Respiratory:  clear to auscultation bilaterally, normal work of breathing GI: soft, nontender, nondistended, + BS MS: no deformity or atrophy Skin: warm and dry, no rash Neuro:  Strength and sensation are intact Psych: euthymic mood, full affect   EKG:   The ekg ordered today demonstrates NSR, poor R wave progression   Recent Labs: 10/15/2022: ALT 13; BUN 10; Creatinine, Ser 0.83; Hemoglobin 12.7; Platelets 266; Potassium 4.4; Sodium 139   Lipid Panel    Component Value Date/Time   CHOL 210 (H) 10/15/2022 1147   CHOL 149 07/28/2012 0926   TRIG 70 10/15/2022 1147   TRIG 75 03/06/2013 0932   TRIG 52 07/28/2012 0926   HDL 68 10/15/2022 1147   HDL 60 03/06/2013 0932   HDL 55 07/28/2012 0926   CHOLHDL 3.1 10/15/2022 1147   LDLCALC 130 (H) 10/15/2022 1147   LDLCALC 70 03/06/2013 0932   LDLCALC 84 07/28/2012 0926     Other studies Reviewed: Additional studies/ records that were reviewed today with results demonstrating: August 2024 LDL 138 total cholesterol 161 triglycerides 70 HDL 68.   ASSESSMENT AND PLAN:  HTN: Home readings improved since spike in June.  Continue current meds. Plan for calcium scoring.  Continue to monitor blood pressures at home.  Increased exercise will also help control blood pressure. High cholesterol: Continue pravastatin.  If Calcium score is elevated, may need more intensive therapy.  Current smoker: still smoking 1/2 ppd.  Can try 14 mg nicotine patches daily. Exercise target noted below. Had  1 episode of high heart rate based on her Fitbit.  No recent episodes.  I think monitoring would be low yield at this point.   Current medicines are reviewed at length with the patient today.  The patient concerns regarding her medicines were addressed.  The following changes have been made:  No change  Labs/ tests ordered today include:  No orders of the defined types were placed in this encounter.   Recommend 150 minutes/week of aerobic exercise Low fat, low carb, high fiber diet recommended  Disposition:   FU in 1 year with a focus on prevention   Signed, Renelda Loma  Eldridge Dace, MD  11/08/2022 8:56 AM    [facility] [address] [place], [place], [place]  [address] Phone: [phone]; Fax: [phone]

## 2022-11-22 ENCOUNTER — Ambulatory Visit (HOSPITAL_BASED_OUTPATIENT_CLINIC_OR_DEPARTMENT_OTHER)
Admission: RE | Admit: 2022-11-22 | Discharge: 2022-11-22 | Disposition: A | Discharge status: Home / Self Care | Payer: Medicare HMO | Source: Ambulatory Visit | Attending: Interventional Cardiology | Admitting: Interventional Cardiology

## 2022-11-22 DIAGNOSIS — E785 Hyperlipidemia, unspecified: Secondary | ICD-10-CM | POA: Insufficient documentation

## 2022-11-22 DIAGNOSIS — I10 Essential (primary) hypertension: Secondary | ICD-10-CM | POA: Insufficient documentation

## 2022-11-23 ENCOUNTER — Other Ambulatory Visit: Payer: Self-pay | Admitting: *Deleted

## 2022-11-23 DIAGNOSIS — E785 Hyperlipidemia, unspecified: Secondary | ICD-10-CM

## 2022-11-23 NOTE — Addendum Note (Signed)
Addended by: Dossie Arbour on: 11/23/2022 03:59 PM   Modules accepted: Orders

## 2022-11-26 ENCOUNTER — Ambulatory Visit: Payer: Medicare HMO | Attending: Cardiology | Admitting: Pharmacist

## 2022-11-26 DIAGNOSIS — R931 Abnormal findings on diagnostic imaging of heart and coronary circulation: Secondary | ICD-10-CM | POA: Insufficient documentation

## 2022-11-26 DIAGNOSIS — E7800 Pure hypercholesterolemia, unspecified: Secondary | ICD-10-CM | POA: Diagnosis not present

## 2022-11-26 MED ORDER — NEXLIZET 180-10 MG PO TABS: 1.0000 | ORAL_TABLET | Freq: Every day | ORAL | 11 refills | Status: AC

## 2022-11-26 NOTE — Progress Notes (Addendum)
Patient ID: Whitney Ross                 DOB: [birth year]                    MRN: [id number]     HPI: Whitney Ross is a 67 y.o. female patient referred to lipid clinic by Dr Eldridge Dace. PMH is significant for HTN, HLD, and tobacco use (1/2 PPD). Underwent calcium scoring on 11/22/22 which was elevated at 56.9 (75th percentile). She is intolerant to mulitple statins and was referred to PharmD for further management.  Pt presents today for follow up. Has been tolerating pravastatin 20mg  daily well. Has previously tried atorvastatin, rosuvastatin, and simvastatin which all caused joint pain. Hasn't picked up nicotine patch yet from the pharmacy. Has been making improvements to her diet. Walks all day at work.  Current Medications: pravastatin 20mg  daily Intolerances: atorvastatin 20-40mg  daily, rosuvastatin, simvastatin 20mg  daily - joint pain Risk Factors: elevated calcium score, tobacco use LDL goal: 70mg /dL  Diet: avoiding saturated fats. No red meat or soda since June.  Exercise: plans to start walking. Walks at work, on her feet all day working 20-30 hours a week 7-10k steps when there  Family History: Heart failure in her mother; Hyperlipidemia in her brother and sister; Hypertension in her brother and sister; Melanoma in her father.   Social History: No alcohol or drug use, has smoked for 45 years.  Labs: 10/15/22: TC 210, TG 70, HDL 68, LDL 130 - on pravastatin 20mg  daily  Past Medical History:  Diagnosis Date   Chronic kidney disease    hx muscle stones   Diverticulitis    Elevated cholesterol    GERD (gastroesophageal reflux disease)    Heart murmur    Hypertension    Lumbago    Neck pain    Nephrolithiasis     Current Outpatient Medications on File Prior to Visit  Medication Sig Dispense Refill   albuterol (VENTOLIN HFA) 108 (90 Base) MCG/ACT inhaler Inhale 2 puffs into the lungs every 6 (six) hours as needed for wheezing or shortness of breath. 8.5 g 5    azelastine (ASTELIN) 0.1 % nasal spray Place 1 spray into both nostrils 2 (two) times daily. 30 mL 12   cetirizine (ZYRTEC) 10 MG tablet Take 1 tablet (10 mg total) by mouth daily. 90 tablet 2   ciclopirox (PENLAC) 8 % solution Apply topically at bedtime. Apply over nail and surrounding skin. Apply daily over previous coat. After seven (7) days, may remove with alcohol and continue cycle. 6.6 mL 0   diazepam (VALIUM) 5 MG tablet Take 0.5-1 tablets (2.5-5 mg total) by mouth daily as needed for anxiety. 30 tablet 5   diclofenac (VOLTAREN) 75 MG EC tablet TAKE 1 TABLET (75 MG TOTAL) BY MOUTH 2 (TWO) TIMES DAILY AS NEEDED FOR MODERATE PAIN. 180 tablet 0   lisinopril (ZESTRIL) 30 MG tablet Take 1 tablet (30 mg total) by mouth daily. Taking at night 90 tablet 3   mometasone (NASONEX) 50 MCG/ACT nasal spray Place 2 sprays into the nose daily. REPLACE Flonase 1 each 12   nicotine (NICODERM CQ - DOSED IN MG/24 HOURS) 14 mg/24hr patch Place 1 patch (14 mg total) onto the skin daily. 28 patch 2   pantoprazole (PROTONIX) 40 MG tablet Take 1 tablet (40 mg total) by mouth daily. 90 tablet 3   pravastatin (PRAVACHOL) 20 MG tablet Take 1 tablet (20 mg total) by mouth daily. 90  tablet 3   No current facility-administered medications on file prior to visit.    Allergies  Allergen Reactions   Cortisporin [Bacitra-Neomycin-Polymyxin-Hc]     Assessment/Plan:  1. Hyperlipidemia - LDL 130 on pravastatin 20mg  daily which is her max tolerated statin dose. LDL goal < 70 due to elevated calcium score. Reviewed options for lowering LDL cholesterol including Repatha and Nexlizet. Pt is ok with either, copay is the same ($47/month - both Tier 3, Repatha requires PA, Nexlizet does not). Will try Nexlizet first, continue pravastatin, and she will ask her PCP to recheck lipids at her annual appt in February. Advised pt to let me know if she has any tolerability issues and can try Repatha instead.  Purl Claytor E. Royalty Domagala, PharmD,  BCACP, CPP [facility] [address], [place], [place] [address] Phone: [phone]; Fax: [phone] 11/26/2022 2:40 PM

## 2022-11-26 NOTE — Patient Instructions (Addendum)
Your LDL cholesterol is 130 and your goal is < 70  Start taking Nexlizet - 1 tablet once daily. This will lower your LDL by an extra 40%  Continue taking your other medications  Ask your primary care doctor to recheck your cholesterol when you see her in February  Call Aundra Millet, PharmD with any trouble tolerating the Nexlizet at [phone]. A back up option is Repatha which is a twice monthly injection that lowers your LDL by 60%

## 2022-12-09 DIAGNOSIS — Z026 Encounter for examination for insurance purposes: Secondary | ICD-10-CM | POA: Diagnosis not present

## 2022-12-09 DIAGNOSIS — X501XXA Overexertion from prolonged static or awkward postures, initial encounter: Secondary | ICD-10-CM | POA: Diagnosis not present

## 2022-12-09 DIAGNOSIS — M25571 Pain in right ankle and joints of right foot: Secondary | ICD-10-CM | POA: Diagnosis not present

## 2022-12-09 DIAGNOSIS — S8261XA Displaced fracture of lateral malleolus of right fibula, initial encounter for closed fracture: Secondary | ICD-10-CM | POA: Diagnosis not present

## 2022-12-09 DIAGNOSIS — Y990 Civilian activity done for income or pay: Secondary | ICD-10-CM | POA: Diagnosis not present

## 2022-12-25 DIAGNOSIS — Z1231 Encounter for screening mammogram for malignant neoplasm of breast: Secondary | ICD-10-CM | POA: Diagnosis not present

## 2022-12-29 ENCOUNTER — Encounter: Payer: Self-pay | Admitting: Family Medicine

## 2023-01-07 DIAGNOSIS — R69 Illness, unspecified: Secondary | ICD-10-CM | POA: Diagnosis not present

## 2023-02-11 DIAGNOSIS — Z85828 Personal history of other malignant neoplasm of skin: Secondary | ICD-10-CM | POA: Diagnosis not present

## 2023-02-11 DIAGNOSIS — Z08 Encounter for follow-up examination after completed treatment for malignant neoplasm: Secondary | ICD-10-CM | POA: Diagnosis not present

## 2023-02-11 DIAGNOSIS — L538 Other specified erythematous conditions: Secondary | ICD-10-CM | POA: Diagnosis not present

## 2023-02-11 DIAGNOSIS — L814 Other melanin hyperpigmentation: Secondary | ICD-10-CM | POA: Diagnosis not present

## 2023-02-11 DIAGNOSIS — L820 Inflamed seborrheic keratosis: Secondary | ICD-10-CM | POA: Diagnosis not present

## 2023-02-11 DIAGNOSIS — R208 Other disturbances of skin sensation: Secondary | ICD-10-CM | POA: Diagnosis not present

## 2023-02-11 DIAGNOSIS — D225 Melanocytic nevi of trunk: Secondary | ICD-10-CM | POA: Diagnosis not present

## 2023-02-14 ENCOUNTER — Other Ambulatory Visit: Payer: Self-pay | Admitting: Family Medicine

## 2023-02-14 ENCOUNTER — Ambulatory Visit: Payer: Medicare HMO | Admitting: Family Medicine

## 2023-02-14 ENCOUNTER — Other Ambulatory Visit: Payer: Medicare HMO

## 2023-02-14 ENCOUNTER — Ambulatory Visit (INDEPENDENT_AMBULATORY_CARE_PROVIDER_SITE_OTHER): Payer: Medicare HMO

## 2023-02-14 DIAGNOSIS — E7800 Pure hypercholesterolemia, unspecified: Secondary | ICD-10-CM | POA: Diagnosis not present

## 2023-02-14 DIAGNOSIS — Z23 Encounter for immunization: Secondary | ICD-10-CM

## 2023-02-14 NOTE — Progress Notes (Signed)
2nd shingrix vaccine given right deltoid - patient tolerated well

## 2023-02-14 NOTE — Progress Notes (Signed)
Patient here for labs from her cardiologist.  However, no labs appear to be ordered.  Looks like he changed her medication to [facility] in October.  I have gone ahead and placed future orders for lipid panel and liver function test and CCed results to him  Orders Placed This Encounter  Procedures   Hepatic function panel    Standing Status:   Future    Expiration Date:   02/14/2024    CC Results:   Corky Crafts [[id number]]   Lipid panel    Standing Status:   Future    Expiration Date:   02/14/2024    CC Results:   Corky Crafts [[id number]]

## 2023-02-15 LAB — HEPATIC FUNCTION PANEL
ALT: 14 [IU]/L (ref 0–32)
AST: 20 [IU]/L (ref 0–40)
Albumin: 4.3 g/dL (ref 3.9–4.9)
Alkaline Phosphatase: 56 [IU]/L (ref 44–121)
Bilirubin Total: 0.4 mg/dL (ref 0.0–1.2)
Bilirubin, Direct: 0.16 mg/dL (ref 0.00–0.40)
Total Protein: 6.6 g/dL (ref 6.0–8.5)

## 2023-02-15 LAB — LIPID PANEL
Chol/HDL Ratio: 2.2 {ratio} (ref 0.0–4.4)
Cholesterol, Total: 146 mg/dL (ref 100–199)
HDL: 66 mg/dL (ref >39)
LDL Chol Calc (NIH): 66 mg/dL (ref 0–99)
Triglycerides: 70 mg/dL (ref 0–149)
VLDL Cholesterol Cal: 14 mg/dL (ref 5–40)

## 2023-03-07 DIAGNOSIS — X58XXXA Exposure to other specified factors, initial encounter: Secondary | ICD-10-CM | POA: Diagnosis not present

## 2023-03-07 DIAGNOSIS — S8261XA Displaced fracture of lateral malleolus of right fibula, initial encounter for closed fracture: Secondary | ICD-10-CM | POA: Diagnosis not present

## 2023-03-07 DIAGNOSIS — S92351A Displaced fracture of fifth metatarsal bone, right foot, initial encounter for closed fracture: Secondary | ICD-10-CM | POA: Diagnosis not present

## 2023-03-10 ENCOUNTER — Encounter: Payer: Self-pay | Admitting: Pharmacist Clinician (PhC)/ Clinical Pharmacy Specialist

## 2023-03-10 ENCOUNTER — Encounter: Payer: Self-pay | Admitting: Pharmacist

## 2023-04-12 ENCOUNTER — Other Ambulatory Visit: Payer: Self-pay | Admitting: Nurse Practitioner

## 2023-04-19 ENCOUNTER — Ambulatory Visit (INDEPENDENT_AMBULATORY_CARE_PROVIDER_SITE_OTHER): Payer: Medicare HMO | Admitting: Family Medicine

## 2023-04-19 ENCOUNTER — Encounter: Payer: Self-pay | Admitting: Family Medicine

## 2023-04-19 VITALS — BP 125/76 | HR 69 | Temp 98.4°F | Ht 65.0 in | Wt 192.4 lb

## 2023-04-19 DIAGNOSIS — Z23 Encounter for immunization: Secondary | ICD-10-CM

## 2023-04-19 DIAGNOSIS — E7800 Pure hypercholesterolemia, unspecified: Secondary | ICD-10-CM

## 2023-04-19 DIAGNOSIS — I10 Essential (primary) hypertension: Secondary | ICD-10-CM

## 2023-04-19 NOTE — Progress Notes (Signed)
 Subjective: CC: Hypertension PCP: Raliegh Ip, DO WUJ:WJXBJ Whitney Ross is a 68 y.o. female presenting to clinic today for:  1.  Hypertension Patient reports that her blood pressures have been running anywhere from 140s over 70s to 140s over 100 in fact she notes that recently at physical therapy is 143/100.  She does not reporting visual disturbance, chest pain, shortness of breath, dizziness.  She has been more sedentary since she hurt her ankle.  She continues to smoke.  She had some leftover lisinopril with hydrochlorothiazide and she utilized that this morning but did finally pick up her lisinopril 30 mg.   ROS: Per HPI  Allergies  Allergen Reactions   Cortisporin [Bacitra-Neomycin-Polymyxin-Hc]    Statins     Joint pain on atorvastatin 20-40mg  daily, rosuvastatin, and simvastatin 20mg  daily   Past Medical History:  Diagnosis Date   Chronic kidney disease    hx muscle stones   Diverticulitis    Elevated cholesterol    GERD (gastroesophageal reflux disease)    Heart murmur    Hypertension    Lumbago    Neck pain    Nephrolithiasis     Current Outpatient Medications:    albuterol (VENTOLIN HFA) 108 (90 Base) MCG/ACT inhaler, Inhale 2 puffs into the lungs every 6 (six) hours as needed for wheezing or shortness of breath., Disp: 8.5 g, Rfl: 5   azelastine (ASTELIN) 0.1 % nasal spray, Place 1 spray into both nostrils 2 (two) times daily., Disp: 30 mL, Rfl: 12   Bempedoic Acid-Ezetimibe (NEXLIZET) 180-10 MG TABS, Take 1 tablet by mouth daily., Disp: 30 tablet, Rfl: 11   cetirizine (ZYRTEC) 10 MG tablet, Take 1 tablet (10 mg total) by mouth daily., Disp: 90 tablet, Rfl: 2   ciclopirox (PENLAC) 8 % solution, Apply topically at bedtime. Apply over nail and surrounding skin. Apply daily over previous coat. After seven (7) days, may remove with alcohol and continue cycle., Disp: 6.6 mL, Rfl: 0   diazepam (VALIUM) 5 MG tablet, Take 0.5-1 tablets (2.5-5 mg total) by mouth  daily as needed for anxiety., Disp: 30 tablet, Rfl: 5   diclofenac (VOLTAREN) 75 MG EC tablet, TAKE 1 TABLET (75 MG TOTAL) BY MOUTH 2 (TWO) TIMES DAILY AS NEEDED FOR MODERATE PAIN., Disp: 180 tablet, Rfl: 0   lisinopril (ZESTRIL) 30 MG tablet, Take 1 tablet (30 mg total) by mouth daily. Taking at night, Disp: 90 tablet, Rfl: 3   mometasone (NASONEX) 50 MCG/ACT nasal spray, Place 2 sprays into the nose daily. REPLACE Flonase, Disp: 1 each, Rfl: 12   nicotine (NICODERM CQ - DOSED IN MG/24 HOURS) 14 mg/24hr patch, Place 1 patch (14 mg total) onto the skin daily., Disp: 28 patch, Rfl: 2   pantoprazole (PROTONIX) 40 MG tablet, Take 1 tablet (40 mg total) by mouth daily., Disp: 90 tablet, Rfl: 3   pravastatin (PRAVACHOL) 20 MG tablet, Take 1 tablet (20 mg total) by mouth daily., Disp: 90 tablet, Rfl: 3 Social History   Socioeconomic History   Marital status: Married    Spouse name: Viviann Spare   Number of children: 1   Years of education: Not on file   Highest education level: Not on file  Occupational History   Occupation: International aid/development worker    Employer: [employer]  Tobacco Use   Smoking status: Every Day    Current packs/day: 0.50    Average packs/day: 1 pack/day for 46.2 years (44.6 ttl pk-yrs)    Types: Cigarettes    Start  date: 1979   Smokeless tobacco: Never  Vaping Use   Vaping status: Never Used  Substance and Sexual Activity   Alcohol use: Not Currently    Comment: RARELY   Drug use: No   Sexual activity: Not Currently  Other Topics Concern   Not on file  Social History Narrative   She is not sure about what her CODE STATUS is and does not have an advanced directive.  She does report that she wants her son to be the decision maker if there is a emergency.  Does not want decisions to be deferred to her husband Viviann Spare.   Social Drivers of Corporate investment banker Strain: Not on file  Food Insecurity: Not on file  Transportation Needs: Not on file  Physical Activity: Not on  file  Stress: Not on file  Social Connections: Not on file  Intimate Partner Violence: Not on file   Family History  Problem Relation Age of Onset   Heart failure Mother    Melanoma Father    Hypertension Sister    Hyperlipidemia Sister    Hyperlipidemia Brother    Hypertension Brother    Colon cancer Neg Hx    Esophageal cancer Neg Hx    Stomach cancer Neg Hx    Rectal cancer Neg Hx     Objective: Office vital signs reviewed. BP 125/76   Pulse 69   Temp 98.4 F (36.9 C)   Ht 5\' 5"  (1.651 m)   Wt 192 lb 6.4 oz (87.3 kg)   SpO2 96%   BMI 32.02 kg/m   Physical Examination:  General: Awake, alert, well nourished, No acute distress HEENT: sclera white, MMM.  Smells of tobacco Cardio: regular rate and rhythm, S1S2 heard, no murmurs appreciated Pulm: clear to auscultation bilaterally, no wheezes, rhonchi or rales; normal work of breathing on room air  Assessment/ Plan: 68 y.o. female   Essential hypertension  Pure hypercholesterolemia  Encounter for immunization - Plan: Flu Vaccine Trivalent High Dose (Fluad)  Controlled upon recheck.  Continue lisinopril 30 mg.  Continue to monitor blood pressures at home with goal of less than 140/90.  I reinforced need for smoking cessation.  She will continue next Lizette as prescribed in addition to her Pravachol  Influenza vaccination administered   Raliegh Ip, DO [facility] [phone]

## 2023-04-29 DIAGNOSIS — S8261XA Displaced fracture of lateral malleolus of right fibula, initial encounter for closed fracture: Secondary | ICD-10-CM | POA: Diagnosis not present

## 2023-05-19 ENCOUNTER — Encounter: Payer: Self-pay | Admitting: Nurse Practitioner

## 2023-05-19 ENCOUNTER — Ambulatory Visit: Admitting: Nurse Practitioner

## 2023-05-19 VITALS — BP 129/74 | HR 85 | Temp 97.8°F | Ht 65.0 in | Wt 195.0 lb

## 2023-05-19 DIAGNOSIS — L509 Urticaria, unspecified: Secondary | ICD-10-CM | POA: Diagnosis not present

## 2023-05-19 MED ORDER — METHYLPREDNISOLONE ACETATE 80 MG/ML IJ SUSP
80.0000 mg | Freq: Once | INTRAMUSCULAR | Status: AC
Start: 2023-05-19 — End: 2023-05-19
  Administered 2023-05-19: 80 mg via INTRAMUSCULAR

## 2023-05-19 NOTE — Progress Notes (Signed)
 Subjective:    Patient ID: Whitney Ross, female    DOB: 1955-09-09, 67 y.o.   MRN: [id number]   Chief Complaint: Allergic Reaction (Took diclofenac this AM at work and had reaction. Was at work and they called EMS and they suggested she be seen)   Allergic Reaction Pertinent negatives include no abdominal pain, chest pain or rash.    Patient took diclofenac last night and woke up in the middle of night with itching and burning of skin. Started on hr feet then spread up her body. She is still itching.  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  Eyes:  Negative for pain.  Respiratory:  Negative for shortness of breath.   Cardiovascular:  Negative for chest pain, palpitations and leg swelling.  Gastrointestinal:  Negative for abdominal pain.  Endocrine: Negative for polydipsia.  Skin:  Negative for rash.  Neurological:  Negative for dizziness, weakness and headaches.  Hematological:  Does not bruise/bleed easily.  All other systems reviewed and are negative.      Objective:   Physical Exam Vitals and nursing note reviewed.  Constitutional:      General: She is not in acute distress.    Appearance: Normal appearance. She is  well-developed.  Neck:     Vascular: No carotid bruit or JVD.  Cardiovascular:     Rate and Rhythm: Normal rate and regular rhythm.     Heart sounds: Normal heart sounds.  Pulmonary:     Effort: Pulmonary effort is normal. No respiratory distress.     Breath sounds: Normal breath sounds. No wheezing or rales.  Chest:     Chest wall: No tenderness.  Abdominal:     General: Bowel sounds are normal. There is no distension or abdominal bruit.     Palpations: Abdomen is soft. There is no hepatomegaly, splenomegaly, mass or pulsatile mass.     Tenderness: There is no abdominal tenderness.  Musculoskeletal:        General: Normal range of motion.     Cervical back: Normal range of motion and neck supple.  Lymphadenopathy:     Cervical: No cervical adenopathy.  Skin:    General: Skin is warm and dry.     Comments: Full body erythema and itching  Neurological:     Mental Status: She is alert and oriented to person, place, and time.     Deep Tendon Reflexes: Reflexes are normal and symmetric.  Psychiatric:        Behavior: Behavior normal.        Thought Content: Thought content normal.        Judgment: Judgment normal.     BP 129/74   Pulse 85   Temp 97.8 F (36.6 C) (Temporal)   Ht 5\' 5"  (1.651 m)  Wt 195 lb (88.5 kg)   SpO2 98%   BMI 32.45 kg/m        Assessment & Plan:   [name] in today with chief complaint of Allergic Reaction (Took diclofenac this AM at work and had reaction. Was at work and they called EMS and they suggested she be seen)   1. Urticaria (Primary) Benadryl OTC Avoid scratching Cool compresses - methylPREDNISolone acetate (DEPO-MEDROL) injection 80 mg    The above assessment and management plan was discussed with the patient. The patient verbalized understanding of and has agreed to the management plan. Patient is aware to call the clinic if symptoms persist or worsen. Patient is aware when to return to the clinic for a follow-up visit.  Patient educated on when it is appropriate to go to the emergency department.   Mary-Margaret Daphine Deutscher, FNP

## 2023-05-19 NOTE — Addendum Note (Signed)
 Addended by: Bennie Pierini on: 05/19/2023 10:56 AM   Modules accepted: Orders

## 2023-05-19 NOTE — Patient Instructions (Signed)
 Drug Allergy: What to Know A drug allergy is when your body reacts in a bad way to a medicine. The reaction may be mild or very bad. In some cases, it can be life-threatening. Symptoms of a reaction often happen 1 to 2 hours after taking the medicine. But they can sometimes take a week or more to appear. If you have an allergic reaction, get help right away. You should get help even if the reaction seems mild. What are the causes? A drug allergy is caused by a reaction in your body's defense system, also called the immune system. It causes your body to release substances that can cause swelling. This can lead to reduced blood flow in parts of the body. What are the signs or symptoms? Symptoms of a mild reaction Swelling of the sinuses. These are spaces behind the bones of your face and forehead. Tingling in your mouth. An itchy, red rash. Symptoms of a very bad reaction Swelling of your face, lips, tongue, or mouth. You may also have: Swelling of the back of the throat. Trouble talking or swallowing. Making high-pitched whistling sounds when you breathe. This is called wheezing. Hives. These are itchy, red, swollen areas of skin. Feeling dizzy or fainting. Confusion. A tight feeling in your chest. Fast or uneven heartbeats. Pain in your belly. Throwing up or having watery poop (diarrhea). How is this treated? There's no cure for allergies. Very bad reactions need to be treated right away in a hospital. An allergic reaction can be treated with: Medicines to help your symptoms. Inhalers. These are medicines that help open the airways in your lungs. A shot for a very bad allergic reaction. This medicine is called epinephrine. Your doctor may teach you how to use an allergy kit and how to give yourself a shot. You may be given an auto-injector pen. This is a device filled with medicine that's used to give a shot. Follow these instructions at home: If you have a very bad allergy:  Always  keep an allergy pen or your kit with you. This could save your life. Use it as told by your doctor. Replace your allergy pen or kit right away after using it. Wear a medical alert bracelet or necklace that shows you have a drug allergy. Make sure that you, the people who live with you, and your employer know how to use your allergy pen or kit. General instructions Avoid medicines that you're allergic to. Take medicines only as told. If you were given medicines to treat an allergic reaction, do not drive until your doctor tells you it's safe. If you have hives or a rash: Use over-the-counter medicines as told by your doctor. Put cold, wet cloths on your skin. Take baths or showers in cool water. Avoid hot water. If you had tests done, ask when your results will be ready and how to get them. You may need to call or meet with your doctor to get your results. Tell all your doctors that you have a medicine allergy. Also tell your family members or other people who live with you. Contact a doctor if: You think you're having a mild allergic reaction. You have symptoms that last more than 2 days after your reaction. You get new symptoms. Get help right away if: You had to use your allergy pen or kit. You have symptoms of a very bad allergic reaction. These symptoms may be an emergency. Use your allergy pen or kit as you've been told. Call 911  right away. Do not wait to see if the symptoms will go away. Do not drive yourself to the hospital. This information is not intended to replace advice given to you by your health care provider. Make sure you discuss any questions you have with your health care provider. Document Revised: 10/12/2022 Document Reviewed: 10/12/2022 Elsevier Patient Education  2024 [employer].

## 2023-05-24 ENCOUNTER — Ambulatory Visit: Payer: Medicare HMO | Admitting: Family Medicine

## 2023-05-24 ENCOUNTER — Encounter: Payer: Self-pay | Admitting: Family Medicine

## 2023-05-24 VITALS — BP 129/78 | HR 66 | Temp 98.4°F | Ht 65.0 in | Wt 193.0 lb

## 2023-05-24 DIAGNOSIS — E7800 Pure hypercholesterolemia, unspecified: Secondary | ICD-10-CM | POA: Diagnosis not present

## 2023-05-24 DIAGNOSIS — T7840XD Allergy, unspecified, subsequent encounter: Secondary | ICD-10-CM

## 2023-05-24 DIAGNOSIS — J329 Chronic sinusitis, unspecified: Secondary | ICD-10-CM | POA: Diagnosis not present

## 2023-05-24 DIAGNOSIS — K219 Gastro-esophageal reflux disease without esophagitis: Secondary | ICD-10-CM

## 2023-05-24 DIAGNOSIS — I10 Essential (primary) hypertension: Secondary | ICD-10-CM | POA: Diagnosis not present

## 2023-05-24 MED ORDER — ALBUTEROL SULFATE HFA 108 (90 BASE) MCG/ACT IN AERS: 2.0000 | INHALATION_SPRAY | Freq: Four times a day (QID) | RESPIRATORY_TRACT | 5 refills | Status: AC | PRN

## 2023-05-24 MED ORDER — PANTOPRAZOLE SODIUM 40 MG PO TBEC: 40.0000 mg | DELAYED_RELEASE_TABLET | Freq: Every day | ORAL | 3 refills | Status: AC

## 2023-05-24 MED ORDER — MOMETASONE FUROATE 50 MCG/ACT NA SUSP: 2.0000 | Freq: Every day | NASAL | 12 refills | Status: AC

## 2023-05-24 MED ORDER — PRAVASTATIN SODIUM 20 MG PO TABS: 20.0000 mg | ORAL_TABLET | Freq: Every day | ORAL | 3 refills | Status: DC

## 2023-05-24 MED ORDER — EPINEPHRINE 0.3 MG/0.3ML IJ SOAJ
0.3000 mg | INTRAMUSCULAR | 0 refills | Status: AC | PRN
Start: 2023-05-24 — End: ?

## 2023-05-24 MED ORDER — CETIRIZINE HCL 10 MG PO TABS: 10.0000 mg | ORAL_TABLET | Freq: Every day | ORAL | 2 refills | Status: AC

## 2023-05-24 MED ORDER — LISINOPRIL 30 MG PO TABS
30.0000 mg | ORAL_TABLET | Freq: Every day | ORAL | 3 refills | Status: AC
Start: 2023-05-24 — End: ?

## 2023-05-24 NOTE — Progress Notes (Signed)
 Subjective: CC: Follow-up hypertension PCP: Raliegh Ip, DO Whitney Ross is a 68 y.o. female presenting to clinic today for:  1.  Hypertension Patient is tolerating the lisinopril 30 mg without difficulty.  She reports no chest pain, shortness of breath.  She does report that she had a severe allergic reaction after taking diclofenac recently.  She has been on this before and never had any issues but it gave her severe allergic reaction such that EMS had to be called.  She was subsequently seen here in office and given appropriate medications.  Wondering if she should have an EpiPen on hand and would like to see an allergist.  2.  Sinusitis She reports that she thought her left sinus seem to be a little bit fuller than the right recently.  She is not taking her Astelin, Nasonex or Zyrtec consistently but does have them on hand if needed.  Denies any purulence from nares.  No fevers.  No dental pain.  No fevers   ROS: Per HPI  Allergies  Allergen Reactions   Diclofenac Shortness Of Breath, Diarrhea and Dermatitis   Cortisporin [Bacitra-Neomycin-Polymyxin-Hc]    Statins     Joint pain on atorvastatin 20-40mg  daily, rosuvastatin, and simvastatin 20mg  daily   Past Medical History:  Diagnosis Date   Chronic kidney disease    hx muscle stones   Diverticulitis    Elevated cholesterol    GERD (gastroesophageal reflux disease)    Heart murmur    Hypertension    Lumbago    Neck pain    Nephrolithiasis     Current Outpatient Medications:    albuterol (VENTOLIN HFA) 108 (90 Base) MCG/ACT inhaler, Inhale 2 puffs into the lungs every 6 (six) hours as needed for wheezing or shortness of breath., Disp: 8.5 g, Rfl: 5   azelastine (ASTELIN) 0.1 % nasal spray, Place 1 spray into both nostrils 2 (two) times daily., Disp: 30 mL, Rfl: 12   Bempedoic Acid-Ezetimibe (NEXLIZET) 180-10 MG TABS, Take 1 tablet by mouth daily., Disp: 30 tablet, Rfl: 11   cetirizine (ZYRTEC) 10 MG  tablet, Take 1 tablet (10 mg total) by mouth daily., Disp: 90 tablet, Rfl: 2   ciclopirox (PENLAC) 8 % solution, Apply topically at bedtime. Apply over nail and surrounding skin. Apply daily over previous coat. After seven (7) days, may remove with alcohol and continue cycle., Disp: 6.6 mL, Rfl: 0   diazepam (VALIUM) 5 MG tablet, Take 0.5-1 tablets (2.5-5 mg total) by mouth daily as needed for anxiety., Disp: 30 tablet, Rfl: 5   lisinopril (ZESTRIL) 30 MG tablet, Take 1 tablet (30 mg total) by mouth daily. Taking at night, Disp: 90 tablet, Rfl: 3   mometasone (NASONEX) 50 MCG/ACT nasal spray, Place 2 sprays into the nose daily. REPLACE Flonase, Disp: 1 each, Rfl: 12   nicotine (NICODERM CQ - DOSED IN MG/24 HOURS) 14 mg/24hr patch, Place 1 patch (14 mg total) onto the skin daily., Disp: 28 patch, Rfl: 2   pantoprazole (PROTONIX) 40 MG tablet, Take 1 tablet (40 mg total) by mouth daily., Disp: 90 tablet, Rfl: 3   pravastatin (PRAVACHOL) 20 MG tablet, Take 1 tablet (20 mg total) by mouth daily., Disp: 90 tablet, Rfl: 3 Social History   Socioeconomic History   Marital status: Married    Spouse name: Viviann Spare   Number of children: 1   Years of education: Not on file   Highest education level: Not on file  Occupational History   Occupation:  International aid/development worker    Employer: [employer]  Tobacco Use   Smoking status: Every Day    Current packs/day: 0.50    Average packs/day: 1 pack/day for 46.2 years (44.6 ttl pk-yrs)    Types: Cigarettes    Start date: 1979   Smokeless tobacco: Never  Vaping Use   Vaping status: Never Used  Substance and Sexual Activity   Alcohol use: Not Currently    Comment: RARELY   Drug use: No   Sexual activity: Not Currently  Other Topics Concern   Not on file  Social History Narrative   She is not sure about what her CODE STATUS is and does not have an advanced directive.  She does report that she wants her son to be the decision maker if there is a emergency.  Does  not want decisions to be deferred to her husband Viviann Spare.   Social Drivers of Corporate investment banker Strain: Low Risk  (04/19/2023)   Overall Financial Resource Strain (CARDIA)    Difficulty of Paying Living Expenses: Not hard at all  Food Insecurity: No Food Insecurity (04/19/2023)   Hunger Vital Sign    Worried About Running Out of Food in the Last Year: Never true    Ran Out of Food in the Last Year: Never true  Transportation Needs: No Transportation Needs (04/19/2023)   PRAPARE - Administrator, Civil Service (Medical): No    Lack of Transportation (Non-Medical): No  Physical Activity: Insufficiently Active (04/19/2023)   Exercise Vital Sign    Days of Exercise per Week: 3 days    Minutes of Exercise per Session: 40 min  Stress: No Stress Concern Present (04/19/2023)   [employer] of Occupational Health - Occupational Stress Questionnaire    Feeling of Stress : Not at all  Social Connections: Socially Integrated (04/19/2023)   Social Connection and Isolation Panel [NHANES]    Frequency of Communication with Friends and Family: Twice a week    Frequency of Social Gatherings with Friends and Family: Three times a week    Attends Religious Services: 1 to 4 times per year    Active Member of Clubs or Organizations: Yes    Attends Banker Meetings: 1 to 4 times per year    Marital Status: Married  Catering manager Violence: Not At Risk (04/19/2023)   Humiliation, Afraid, Rape, and Kick questionnaire    Fear of Current or Ex-Partner: No    Emotionally Abused: No    Physically Abused: No    Sexually Abused: No   Family History  Problem Relation Age of Onset   Heart failure Mother    Melanoma Father    Hypertension Sister    Hyperlipidemia Sister    Hyperlipidemia Brother    Hypertension Brother    Colon cancer Neg Hx    Esophageal cancer Neg Hx    Stomach cancer Neg Hx    Rectal cancer Neg Hx     Objective: Office vital signs reviewed. BP  129/78   Pulse 66   Temp 98.4 F (36.9 C)   Ht 5\' 5"  (1.651 m)   Wt 193 lb (87.5 kg)   SpO2 96%   BMI 32.12 kg/m   Physical Examination:  General: Awake, alert, well nourished, No acute distress HEENT: Normal    Eyes: PERRLA, extraocular membranes intact, sclera white    Nose: nasal turbinates moist but erythematous, clear nasal discharge    Throat: moist mucus membranes, no erythema,  no tonsillar exudate.  Airway is patent Cardio: regular rate and rhythm, S1S2 heard, no murmurs appreciated Pulm: clear to auscultation bilaterally, no wheezes, rhonchi or rales; normal work of breathing on room air   Assessment/ Plan: 68 y.o. female   Essential hypertension - Plan: Basic Metabolic Panel, lisinopril (ZESTRIL) 30 MG tablet  Severe allergic reaction, subsequent encounter - Plan: EPINEPHrine (EPIPEN 2-PAK) 0.3 mg/0.3 mL IJ SOAJ injection, Ambulatory referral to Allergy  Rhinosinusitis - Plan: albuterol (VENTOLIN HFA) 108 (90 Base) MCG/ACT inhaler, cetirizine (ZYRTEC) 10 MG tablet, mometasone (NASONEX) 50 MCG/ACT nasal spray  Gastroesophageal reflux disease without esophagitis - Plan: pantoprazole (PROTONIX) 40 MG tablet  Pure hypercholesterolemia - Plan: pravastatin (PRAVACHOL) 20 MG tablet  Blood pressure under excellent control with increased dose of lisinopril.  Continue current regimen.  Check renal function.  I have ordered EpiPen and referred her to allergy.  It is unusual that she suddenly had an allergic reaction to this NSAID but I did advise her to totally avoid NSAIDs until she is evaluated.  I fear that she would have a significant allergic reaction to even other meds in the class.  Okay to use Tylenol if needed for pain  I have renewed her allergy medications and encouraged consistent use.  Did not discuss GERD or hyperlipidemia today but renewals on both of meds sent   Raliegh Ip, DO [facility] [phone]

## 2023-05-25 ENCOUNTER — Encounter: Payer: Self-pay | Admitting: Family Medicine

## 2023-05-25 DIAGNOSIS — J329 Chronic sinusitis, unspecified: Secondary | ICD-10-CM

## 2023-05-25 LAB — BASIC METABOLIC PANEL WITH GFR
BUN/Creatinine Ratio: 14 (ref 12–28)
BUN: 12 mg/dL (ref 8–27)
CO2: 23 mmol/L (ref 20–29)
Calcium: 9.2 mg/dL (ref 8.7–10.3)
Chloride: 102 mmol/L (ref 96–106)
Creatinine, Ser: 0.86 mg/dL (ref 0.57–1.00)
Glucose: 94 mg/dL (ref 70–99)
Potassium: 4.5 mmol/L (ref 3.5–5.2)
Sodium: 142 mmol/L (ref 134–144)
eGFR: 75 mL/min/{1.73_m2} (ref >59)

## 2023-06-13 MED ORDER — AMOXICILLIN-POT CLAVULANATE 875-125 MG PO TABS: 1.0000 | ORAL_TABLET | Freq: Two times a day (BID) | ORAL | 0 refills | Status: DC

## 2023-06-27 ENCOUNTER — Ambulatory Visit: Admitting: Allergy & Immunology

## 2023-07-26 DIAGNOSIS — L821 Other seborrheic keratosis: Secondary | ICD-10-CM | POA: Diagnosis not present

## 2023-07-26 DIAGNOSIS — L814 Other melanin hyperpigmentation: Secondary | ICD-10-CM | POA: Diagnosis not present

## 2023-07-26 DIAGNOSIS — D225 Melanocytic nevi of trunk: Secondary | ICD-10-CM | POA: Diagnosis not present

## 2023-08-02 ENCOUNTER — Ambulatory Visit: Admitting: Allergy & Immunology

## 2023-08-02 ENCOUNTER — Other Ambulatory Visit: Payer: Self-pay

## 2023-08-02 ENCOUNTER — Encounter: Payer: Self-pay | Admitting: Allergy & Immunology

## 2023-08-02 VITALS — BP 140/92 | HR 64 | Temp 97.6°F | Resp 16 | Ht 64.17 in | Wt 191.5 lb

## 2023-08-02 DIAGNOSIS — T782XXA Anaphylactic shock, unspecified, initial encounter: Secondary | ICD-10-CM

## 2023-08-02 DIAGNOSIS — Z886 Allergy status to analgesic agent status: Secondary | ICD-10-CM

## 2023-08-02 DIAGNOSIS — T782XXD Anaphylactic shock, unspecified, subsequent encounter: Secondary | ICD-10-CM | POA: Diagnosis not present

## 2023-08-02 NOTE — Progress Notes (Unsigned)
 NEW PATIENT  Date of Service/Encounter:  08/02/23  Consult requested by: Eliodoro Guerin, DO   Assessment:   Anaphylaxis, initial encounter - Plan: Tryptase, Hymenoptera Venom Allergy Prof, Hornet, White Face IgE, Hornet, Yellow IgE, Alpha-Gal Panel, Hymenoptera Venom Allergy II, Alpha-Gal Panel  Allergy to NSAIDs  Plan/Recommendations:   Patient Instructions  1. Anaphylaxis - I am going to get a tryptase to look for overactive mast cells. - We are also going to get a stinging insect allergy panel to make sure. - We will get an alpha gal panel to look for a red meat allergy. - I think   2. Allergy to NSAIDs - I think it might be worth it to at least do a challenge to Celebrex (this is often tolerated by folks with NSAID allergies).  - Some people are allergic only to individual NSAIDs.   3. Return in about 4 weeks (around 08/30/2023) for CELEBREX CHALLENGE (OK TO SCHEDULE IN [place] IF SHE IS INTERESTED) . You can have the follow up appointment with Dr. Idolina Maker or a Nurse Practicioner (our Nurse Practitioners are excellent and always have Physician oversight!).    Please inform us  of any Emergency Department visits, hospitalizations, or changes in symptoms. Call us  before going to the ED for breathing or allergy symptoms since we might be able to fit you in for a sick visit. Feel free to contact us  anytime with any questions, problems, or concerns.  It was a pleasure to meet you today!  Websites that have reliable patient information: 1. American Academy of Asthma, Allergy, and Immunology: www.aaaai.org 2. Food Allergy Research and Education (FARE): foodallergy.org 3. Mothers of Asthmatics: http://www.asthmacommunitynetwork.org 4. American College of Allergy, Asthma, and Immunology: www.acaai.org      "Like" us  on Facebook and Instagram for our latest updates!      A healthy democracy works best when [employer] participate! Make sure you are registered to  vote! If you have moved or changed any of your contact information, you will need to get this updated before voting! Scan the QR codes below to learn more!              This note in its entirety was forwarded to the Provider who requested this consultation.  Subjective:   Whitney Ross is a 68 y.o. female presenting today for evaluation of  Chief Complaint  Patient presents with  . Establish Care    Pt reports having a reaction to a NSAID. Voltaren  gel. Burning, itching of both feet, hands, and legs, vomiting, chest pains.    Whitney Ross has a history of the following: Patient Active Problem List   Diagnosis Date Noted  . Elevated coronary artery calcium  score 11/26/2022  . RLS (restless legs syndrome) 06/20/2019  . Excessive daytime sleepiness 06/20/2019  . Pure hypercholesterolemia 07/19/2018  . Anxiety 01/04/2018  . Motion sickness 12/28/2016  . Primary insomnia 06/24/2016  . 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 Arthritis of right shoulder region 11/03/2012  . Overuse syndrome 11/03/2012  . Tobacco use disorder 07/29/2012  . Vitamin D deficiency 07/29/2012  . HLD (hyperlipidemia) 07/29/2012  . Gastroesophageal reflux disease without esophagitis 07/29/2012  . HTN (hypertension) 07/29/2012  . 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 Sinusitis, acute 05/19/2012  . Stress at home 05/19/2012    History obtained from: chart review and patient.  Discussed the use of AI scribe software for clinical note transcription with the patient and/or guardian, who gave verbal consent to proceed.  [employer]  L Ridgley was referred by Eliodoro Guerin, DO.     Whitney Ross is a 68 y.o. female presenting for a follow up visit.    Asthma/Respiratory Symptom History: ***  Allergic Rhinitis Symptom History: ***  Food Allergy Symptom History: ***  Skin Symptom History: ***  GERD Symptom History: ***  Infection Symptom History: ***  ***Otherwise, there is no history of other  atopic diseases, including asthma, food allergies, drug allergies, environmental allergies, stinging insect allergies, eczema, urticaria, or contact dermatitis. There is no significant infectious history. ***Vaccinations are up to date.    Past Medical History: Patient Active Problem List   Diagnosis Date Noted  . Elevated coronary artery calcium  score 11/26/2022  . RLS (restless legs syndrome) 06/20/2019  . Excessive daytime sleepiness 06/20/2019  . Pure hypercholesterolemia 07/19/2018  . Anxiety 01/04/2018  . Motion sickness 12/28/2016  . Primary insomnia 06/24/2016  . 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 Arthritis of right shoulder region 11/03/2012  . Overuse syndrome 11/03/2012  . Tobacco use disorder 07/29/2012  . Vitamin D deficiency 07/29/2012  . HLD (hyperlipidemia) 07/29/2012  . Gastroesophageal reflux disease without esophagitis 07/29/2012  . HTN (hypertension) 07/29/2012  . 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 Sinusitis, acute 05/19/2012  . Stress at home 05/19/2012    Medication List:  Allergies as of 08/02/2023       Reactions   Diclofenac  Shortness Of Breath, Diarrhea, Dermatitis   Cortisporin [bacitra-neomycin-polymyxin-hc]    Statins    Joint pain on atorvastatin  20-40mg  daily, rosuvastatin, and simvastatin 20mg  daily        Medication List        Accurate as of August 02, 2023  4:42 PM. If you have any questions, ask your nurse or doctor.          STOP taking these medications    amoxicillin -clavulanate 875-125 MG tablet Commonly known as: AUGMENTIN  Stopped by: [place] Chuck   ciclopirox  8 % solution Commonly known as: PENLAC  Stopped by: [place] Chuck       TAKE these medications    albuterol  108 (90 Base) MCG/ACT inhaler Commonly known as: VENTOLIN  HFA Inhale 2 puffs into the lungs every 6 (six) hours as needed for wheezing or shortness of breath.   azelastine  0.1 % nasal spray Commonly known as: ASTELIN  Place 1 spray into  both nostrils 2 (two) times daily.   cetirizine  10 MG tablet Commonly known as: ZYRTEC  Take 1 tablet (10 mg total) by mouth daily.   diazepam  5 MG tablet Commonly known as: VALIUM  Take 0.5-1 tablets (2.5-5 mg total) by mouth daily as needed for anxiety.   EPINEPHrine  0.3 mg/0.3 mL Soaj injection Commonly known as: EpiPen  2-Pak Inject 0.3 mg into the muscle as needed for anaphylaxis.   lisinopril  30 MG tablet Commonly known as: ZESTRIL  Take 1 tablet (30 mg total) by mouth daily. Taking at night   mometasone  50 MCG/ACT nasal spray Commonly known as: Nasonex  Place 2 sprays into the nose daily. REPLACE Flonase    Nexlizet  180-10 MG Tabs Generic drug: Bempedoic Acid-Ezetimibe Take 1 tablet by mouth daily.   nicotine  14 mg/24hr patch Commonly known as: NICODERM CQ  - dosed in mg/24 hours Place 1 patch (14 mg total) onto the skin daily.   pantoprazole  40 MG tablet Commonly known as: PROTONIX  Take 1 tablet (40 mg total) by mouth daily.   pravastatin  20 MG tablet Commonly known as: PRAVACHOL  Take 1 tablet (20 mg total) by mouth daily.  Birth History: born at term without complications  Developmental History: Gwenyth has met all milestones on time. She has required no speech therapy, occupational therapy, and physical therapy. ***non-contributory  Past Surgical History: Past Surgical History:  Procedure Laterality Date  . BELPHAROPTOSIS REPAIR Bilateral 06/24/2021   Dr Jeanne Milliner  . CESAREAN SECTION  1987  . CYSTOSCOPY    . KNEE ARTHROSCOPY W/ MENISCAL REPAIR Left   . RECONSTRUCTION OF EYELID    . VAGINAL HYSTERECTOMY  1997   TOTAL VAGINAL     Family History: Family History  Problem Relation Age of Onset  . Heart failure Mother   . Melanoma Father   . Hypertension Sister   . Hyperlipidemia Sister   . Hyperlipidemia Brother   . Hypertension Brother   . Asthma Son   . Allergic rhinitis Son   . Colon cancer Neg Hx   . Esophageal cancer Neg Hx   . Stomach  cancer Neg Hx   . Rectal cancer Neg Hx      Social History: Calyn lives at home with ***.  She lives in a house that is 74+ years old.  There is hardwood throughout the home.  They have electric heating and heat pumps for cooling.  There are 3 dogs inside of the home.  There are no dust mite covers on the bedding.  There is tobacco exposure in the house in the car.  She currently works as an Education officer, community for the past 4 years.  There is exposure to fumes, chemicals, and dust.  She does not live near an interstate or industrial area.  She does not have a HEPA filter.   Review of systems otherwise negative other than that mentioned in the HPI.    Objective:   Blood pressure (!) 140/92, pulse 64, temperature 97.6 F (36.4 C), temperature source Temporal, resp. rate 16, height 5' 4.17" (1.63 m), weight 191 lb 8 oz (86.9 kg), SpO2 97%. Body mass index is 32.69 kg/m.     Physical Exam   Diagnostic studies:    Spirometry: results abnormal (FEV1: ***%, FVC: ***%, FEV1/FVC: ***%).    Spirometry consistent with normal pattern. Xopenex four puffs via MDI treatment given in clinic with no improvement.  Allergy Studies:                Drexel Gentles, MD [facility] 

## 2023-08-02 NOTE — Patient Instructions (Addendum)
 1. Anaphylaxis - I am going to get a tryptase to look for overactive mast cells. - We are also going to get a stinging insect allergy panel to make sure. - We will get an alpha gal panel to look for a red meat allergy. - I think   2. Allergy to NSAIDs - I think it might be worth it to at least do a challenge to Celebrex (this is often tolerated by folks with NSAID allergies).  - Some people are allergic only to individual NSAIDs.   3. Return in about 4 weeks (around 08/30/2023) for CELEBREX CHALLENGE (OK TO SCHEDULE IN [place] IF SHE IS INTERESTED) . You can have the follow up appointment with Dr. Idolina Maker or a Nurse Practicioner (our Nurse Practitioners are excellent and always have Physician oversight!).    Please inform us  of any Emergency Department visits, hospitalizations, or changes in symptoms. Call us  before going to the ED for breathing or allergy symptoms since we might be able to fit you in for a sick visit. Feel free to contact us  anytime with any questions, problems, or concerns.  It was a pleasure to meet you today!  Websites that have reliable patient information: 1. American Academy of Asthma, Allergy, and Immunology: www.aaaai.org 2. Food Allergy Research and Education (FARE): foodallergy.org 3. Mothers of Asthmatics: http://www.asthmacommunitynetwork.org 4. American College of Allergy, Asthma, and Immunology: www.acaai.org      "Like" us  on [employer] and Instagram for our latest updates!      A healthy democracy works best when [employer] participate! Make sure you are registered to vote! If you have moved or changed any of your contact information, you will need to get this updated before voting! Scan the QR codes below to learn more!

## 2023-08-04 LAB — HYMENOPTERA VENOM ALLERGY II

## 2023-08-09 ENCOUNTER — Ambulatory Visit: Payer: Self-pay | Admitting: Allergy & Immunology

## 2023-08-09 LAB — HYMENOPTERA VENOM ALLERGY II
Bumblebee: <0.1 kU/L
Hornet, White Face, IgE: <0.1 kU/L
Hornet, Yellow, IgE: <0.1 kU/L
I001-IgE Honeybee: <0.1 kU/L
I003-IgE Yellow Jacket: <0.1 kU/L
I004-IgE Paper Wasp: <0.1 kU/L
I208-IgE Api m 1: <0.1 kU/L
I209-IgE Ves v 5: <0.1 kU/L
I210-IgE Pol d 5: <0.1 kU/L
I211-IgE Ves v 1: <0.1 kU/L
I214-IgE Api m 2: <0.1 kU/L
I215-IgE Api m 3: <0.1 kU/L
I216-IgE Api m 5: <0.1 kU/L
I217-IgE Api m 10: <0.1 kU/L
Tryptase: 8.1 ug/L (ref 2.2–13.2)

## 2023-08-09 LAB — ALLERGEN COMPONENT COMMENTS

## 2023-08-22 DIAGNOSIS — H2513 Age-related nuclear cataract, bilateral: Secondary | ICD-10-CM | POA: Diagnosis not present

## 2023-08-22 DIAGNOSIS — D3131 Benign neoplasm of right choroid: Secondary | ICD-10-CM | POA: Diagnosis not present

## 2023-08-22 DIAGNOSIS — H52223 Regular astigmatism, bilateral: Secondary | ICD-10-CM | POA: Diagnosis not present

## 2023-08-22 DIAGNOSIS — H524 Presbyopia: Secondary | ICD-10-CM | POA: Diagnosis not present

## 2023-08-22 DIAGNOSIS — H5203 Hypermetropia, bilateral: Secondary | ICD-10-CM | POA: Diagnosis not present

## 2023-08-22 DIAGNOSIS — H43812 Vitreous degeneration, left eye: Secondary | ICD-10-CM | POA: Diagnosis not present

## 2023-08-25 ENCOUNTER — Ambulatory Visit: Payer: Self-pay | Admitting: *Deleted

## 2023-08-25 DIAGNOSIS — K5792 Diverticulitis of intestine, part unspecified, without perforation or abscess without bleeding: Secondary | ICD-10-CM | POA: Diagnosis not present

## 2023-08-25 NOTE — Telephone Encounter (Signed)
 Copied from CRM [id number]. Topic: Clinical - Red Word Triage >> Aug 25, 2023  8:04 AM Edsel HERO wrote: Abdominal pain  - 7 Reason for Disposition  Age > 60 years    diverticulosis  Answer Assessment - Initial Assessment Questions 1. LOCATION: Where does it hurt?      I'm having terrible abd pain.   I need some Augmentin  for it.   I have diverticulosis.   I ate a cucumber and it does this to me.  I know better.It started yesterday real bad.   Gas, pressure and pain. It hurting in lower abd. 2. RADIATION: Does the pain shoot anywhere else? (e.g., chest, back)     No 3. ONSET: When did the pain begin? (e.g., minutes, hours or days ago)      Yesterday 7/2 4. SUDDEN: Gradual or sudden onset?     Not asked 5. PATTERN Does the pain come and go, or is it constant?    - If it comes and goes: How long does it last? Do you have pain now?     (Note: Comes and goes means the pain is intermittent. It goes away completely between bouts.)    - If constant: Is it getting better, staying the same, or getting worse?      (Note: Constant means the pain never goes away completely; most serious pain is constant and gets worse.)      Constant 6. SEVERITY: How bad is the pain?  (e.g., Scale 1-10; mild, moderate, or severe)    - MILD (1-3): Doesn't interfere with normal activities, abdomen soft and not tender to touch.     - MODERATE (4-7): Interferes with normal activities or awakens from sleep, abdomen tender to touch.     - SEVERE (8-10): Excruciating pain, doubled over, unable to do any normal activities.       7/10 scale 7. RECURRENT SYMPTOM: Have you ever had this type of stomach pain before? If Yes, ask: When was the last time? and What happened that time?      Yes  I have diverticulosis 8. CAUSE: What do you think is causing the stomach pain?     Diverticulosis 9. RELIEVING/AGGRAVATING FACTORS: What makes it better or worse? (e.g., antacids, bending or twisting motion, bowel  movement)     I ate cucumber and it does this to me. 10. OTHER SYMPTOMS: Do you have any other symptoms? (e.g., back pain, diarrhea, fever, urination pain, vomiting)       Abd pain.      I had a little diarrhea yesterday.   I've not eaten much.   Mostly gas and bloating and painful 11. PREGNANCY: Is there any chance you are pregnant? When was your last menstrual period?       N/A due to age  Protocols used: Abdominal Pain - Female-A-AH FYI Only or Action Required?: FYI only for provider.  Patient was last seen in primary care on 05/24/2023 by Jolinda Norene HERO, DO. Called Nurse Triage reporting Abdominal Pain. Symptoms began yesterday. Interventions attempted: Nothing. Symptoms are: gradually worsening. Has diverticulosis and is having a flare up.   Referred to urgent care since no appts today.   Pt agreeable to going when she gets off work today.  Triage Disposition: See Physician Within 24 Hours  Patient/caregiver understands and will follow disposition?: Yes

## 2023-08-30 ENCOUNTER — Ambulatory Visit: Payer: Self-pay | Admitting: Family Medicine

## 2023-08-30 ENCOUNTER — Encounter: Payer: Self-pay | Admitting: Family Medicine

## 2023-08-30 ENCOUNTER — Ambulatory Visit (INDEPENDENT_AMBULATORY_CARE_PROVIDER_SITE_OTHER): Admitting: Family Medicine

## 2023-08-30 VITALS — BP 116/73 | HR 67 | Temp 98.2°F | Ht 64.0 in | Wt 191.0 lb

## 2023-08-30 DIAGNOSIS — R102 Pelvic and perineal pain: Secondary | ICD-10-CM

## 2023-08-30 LAB — MICROSCOPIC EXAMINATION
Bacteria, UA: NONE SEEN
RBC, Urine: NONE SEEN /HPF (ref 0–2)
Renal Epithel, UA: NONE SEEN /HPF
WBC, UA: NONE SEEN /HPF (ref 0–5)
Yeast, UA: NONE SEEN

## 2023-08-30 LAB — URINALYSIS, ROUTINE W REFLEX MICROSCOPIC
Bilirubin, UA: NEGATIVE
Glucose, UA: NEGATIVE
Leukocytes,UA: NEGATIVE
Nitrite, UA: NEGATIVE
Protein,UA: NEGATIVE
Specific Gravity, UA: 1.02 (ref 1.005–1.030)
Urobilinogen, Ur: 0.2 mg/dL (ref 0.2–1.0)
pH, UA: 6 (ref 5.0–7.5)

## 2023-08-30 NOTE — Progress Notes (Signed)
 Subjective: CC: Pelvic pain PCP: Jolinda Norene HERO, DO Whitney Ross is a 68 y.o. female presenting to clinic today for:  1.  Pelvic pain She was seen at the urgent care for diverticulitis last week and was placed on Augmentin .  She is still taking the medication twice daily.  She reports no blood in stool, constipation or diarrhea.  She is been having some low back pain and she is not sure if that has anything to do with diverticulitis.  She denies any dysuria or hematuria.  Though she was not checked for UTI while she was at the urgent care.  No imaging of the pelvis done.  She denies any fevers, nausea or vomiting   ROS: Per HPI  Allergies  Allergen Reactions   Diclofenac  Shortness Of Breath, Diarrhea and Dermatitis   Cortisporin [Bacitra-Neomycin-Polymyxin-Hc]    Statins     Joint pain on atorvastatin  20-40mg  daily, rosuvastatin, and simvastatin 20mg  daily   Past Medical History:  Diagnosis Date   Chronic kidney disease    hx muscle stones   Diverticulitis    Elevated cholesterol    GERD (gastroesophageal reflux disease)    Heart murmur    Hypertension    Lumbago    Neck pain    Nephrolithiasis     Current Outpatient Medications:    albuterol  (VENTOLIN  HFA) 108 (90 Base) MCG/ACT inhaler, Inhale 2 puffs into the lungs every 6 (six) hours as needed for wheezing or shortness of breath., Disp: 8.5 g, Rfl: 5   azelastine  (ASTELIN ) 0.1 % nasal spray, Place 1 spray into both nostrils 2 (two) times daily., Disp: 30 mL, Rfl: 12   Bempedoic Acid-Ezetimibe (NEXLIZET ) 180-10 MG TABS, Take 1 tablet by mouth daily., Disp: 30 tablet, Rfl: 11   cetirizine  (ZYRTEC ) 10 MG tablet, Take 1 tablet (10 mg total) by mouth daily., Disp: 90 tablet, Rfl: 2   diazepam  (VALIUM ) 5 MG tablet, Take 0.5-1 tablets (2.5-5 mg total) by mouth daily as needed for anxiety., Disp: 30 tablet, Rfl: 5   EPINEPHrine  (EPIPEN  2-PAK) 0.3 mg/0.3 mL IJ SOAJ injection, Inject 0.3 mg into the muscle as needed  for anaphylaxis., Disp: 1 each, Rfl: 0   lisinopril  (ZESTRIL ) 30 MG tablet, Take 1 tablet (30 mg total) by mouth daily. Taking at night, Disp: 90 tablet, Rfl: 3   mometasone  (NASONEX ) 50 MCG/ACT nasal spray, Place 2 sprays into the nose daily. REPLACE Flonase , Disp: 1 each, Rfl: 12   nicotine  (NICODERM CQ  - DOSED IN MG/24 HOURS) 14 mg/24hr patch, Place 1 patch (14 mg total) onto the skin daily. (Patient not taking: Reported on 08/02/2023), Disp: 28 patch, Rfl: 2   pantoprazole  (PROTONIX ) 40 MG tablet, Take 1 tablet (40 mg total) by mouth daily., Disp: 90 tablet, Rfl: 3   pravastatin  (PRAVACHOL ) 20 MG tablet, Take 1 tablet (20 mg total) by mouth daily., Disp: 90 tablet, Rfl: 3 Social History   Socioeconomic History   Marital status: Married    Spouse name: Elspeth   Number of children: 1   Years of education: Not on file   Highest education level: Not on file  Occupational History   Occupation: International aid/development worker    Employer: [employer]  Tobacco Use   Smoking status: Every Day    Current packs/day: 0.50    Average packs/day: 1 pack/day for 46.5 years (44.8 ttl pk-yrs)    Types: Cigarettes    Start date: 1979    Passive exposure: Current   Smokeless tobacco:  Never  Vaping Use   Vaping status: Never Used  Substance and Sexual Activity   Alcohol use: Not Currently    Comment: RARELY   Drug use: No   Sexual activity: Not Currently  Other Topics Concern   Not on file  Social History Narrative   She is not sure about what her CODE STATUS is and does not have an advanced directive.  She does report that she wants her son to be the decision maker if there is a emergency.  Does not want decisions to be deferred to her husband Elspeth.   Social Drivers of Corporate investment banker Strain: Low Risk  (04/19/2023)   Overall Financial Resource Strain (CARDIA)    Difficulty of Paying Living Expenses: Not hard at all  Food Insecurity: No Food Insecurity (08/25/2023)   [facility]   Hunger Vital Sign    Within the past 12 months, you worried that your food would run out before you got the money to buy more.: Never true    Within the past 12 months, the food you bought just didn't last and you didn't have money to get more.: Never true  Transportation Needs: No Transportation Needs (08/25/2023)   Received from [facility] - Transportation    Lack of Transportation (Medical): No    Lack of Transportation (Non-Medical): No  Physical Activity: Insufficiently Active (04/19/2023)   Exercise Vital Sign    Days of Exercise per Week: 3 days    Minutes of Exercise per Session: 40 min  Stress: No Stress Concern Present (04/19/2023)   [employer] of Occupational Health - Occupational Stress Questionnaire    Feeling of Stress : Not at all  Social Connections: Socially Integrated (04/19/2023)   Social Connection and Isolation Panel    Frequency of Communication with Friends and Family: Twice a week    Frequency of Social Gatherings with Friends and Family: Three times a week    Attends Religious Services: 1 to 4 times per year    Active Member of Clubs or Organizations: Yes    Attends Banker Meetings: 1 to 4 times per year    Marital Status: Married  Catering manager Violence: Not At Risk (04/19/2023)   Humiliation, Afraid, Rape, and Kick questionnaire    Fear of Current or Ex-Partner: No    Emotionally Abused: No    Physically Abused: No    Sexually Abused: No   Family History  Problem Relation Age of Onset   Heart failure Mother    Melanoma Father    Hypertension Sister    Hyperlipidemia Sister    Hyperlipidemia Brother    Hypertension Brother    Asthma Son    Allergic rhinitis Son    Colon cancer Neg Hx    Esophageal cancer Neg Hx    Stomach cancer Neg Hx    Rectal cancer Neg Hx     Objective: Office vital signs reviewed. BP 116/73   Pulse 67   Temp 98.2 F (36.8 C)   Ht 5' 4 (1.626 m)   Wt 191 lb (86.6 kg)   SpO2  95%   BMI 32.79 kg/m   Physical Examination:  General: Awake, alert, well nourished, No acute distress HEENT: No scleral icterus Cardio: regular rate and rhythm  GI: Soft, nondistended  Assessment/ Plan: 68 y.o. female   Pelvic pain - Plan: Urinalysis, Routine w reflex microscopic  Continue treatment for diverticulitis.  No  evidence of urinary tract infection.  Continue p.o. hydration.  Follow-up if symptoms or not improving or if she starts demonstrating any red flag signs or symptoms, at which point I would recommend CT of the abdomen pelvis to rule out any complications   Geraldene Eisel CHRISTELLA Fielding, DO [facility] [phone]

## 2023-09-01 ENCOUNTER — Ambulatory Visit: Admitting: Allergy & Immunology

## 2023-09-01 ENCOUNTER — Encounter: Payer: Self-pay | Admitting: Allergy & Immunology

## 2023-09-01 ENCOUNTER — Other Ambulatory Visit: Payer: Self-pay

## 2023-09-01 ENCOUNTER — Other Ambulatory Visit (HOSPITAL_COMMUNITY): Payer: Self-pay

## 2023-09-01 VITALS — BP 124/74 | HR 60 | Temp 97.3°F | Resp 18 | Ht 64.17 in | Wt 188.3 lb

## 2023-09-01 DIAGNOSIS — T782XXD Anaphylactic shock, unspecified, subsequent encounter: Secondary | ICD-10-CM

## 2023-09-01 DIAGNOSIS — Z886 Allergy status to analgesic agent status: Secondary | ICD-10-CM

## 2023-09-01 DIAGNOSIS — Z91018 Allergy to other foods: Secondary | ICD-10-CM

## 2023-09-01 MED ORDER — DICLOFENAC SODIUM 75 MG PO TBEC
75.0000 mg | DELAYED_RELEASE_TABLET | Freq: Once | ORAL | 0 refills | Status: AC
Start: 2023-09-01 — End: 2023-09-02
  Filled 2023-09-01 (×2): qty 1, 1d supply, fill #0

## 2023-09-01 NOTE — Progress Notes (Signed)
 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 Anaphylaxis - The alpha gal test was definitely positive. - This is a type of allergy  that can be asymptomatic multiple times and then react the next time.  - EpiPen  training reviewed.   2. Allergy  to NSAIDs - Voltaren  challenge today showed that you do not have an allergy .  - I would put this back into your medication list if you would like.   3. Return in about 1 year (around 08/31/2024). You can have the follow up appointment with Dr. Iva or a Nurse Practicioner (our Nurse Practitioners are excellent and always have Physician oversight!).    Subjective:   Whitney Ross is a 69 y.o. female presenting today for follow up of  Chief Complaint  Patient presents with   Establish Care   Follow-up    Whitney Ross has a history of the following: Patient Active Problem List   Diagnosis Date Noted   Elevated coronary artery calcium  score 11/26/2022   RLS (restless legs syndrome) 06/20/2019   Excessive daytime sleepiness 06/20/2019   Pure hypercholesterolemia 07/19/2018   Anxiety 01/04/2018   Motion sickness 12/28/2016   Primary insomnia 06/24/2016   Genu valgus, congenital 03/06/2013   Arthritis of right shoulder region 11/03/2012   Overuse syndrome 11/03/2012   Tobacco use disorder 07/29/2012   Vitamin D deficiency 07/29/2012   HLD (hyperlipidemia) 07/29/2012   Gastroesophageal reflux disease without esophagitis 07/29/2012   HTN (hypertension) 07/29/2012   Purulent rhinitis 05/19/2012   Sinusitis, acute 05/19/2012   Stress at home 05/19/2012    History obtained from: chart review and patient.  Discussed the use of AI scribe software for clinical note transcription with the patient and/or guardian, who gave verbal consent to  proceed.  Whitney Ross is a 68 y.o. female presenting for a drug challenge.  Whitney Ross had a reaction earlier this year consisting of rapid onset pruritus and diffuse erythematous patches as well as light headedness and diarrhea. This resolved over the next few hours with benadryl and a steroid shot given by her PCP office at a same-day appointment. She states that this happened less than 30 minutes after consuming Voltaren  75 mg pill for some foot pain along with a sandwich consisting of bacon and bread. She had used voltaren  for years without problems and has since consumed bacon and breads without any signs of a reaction. The patient has not had any other reactions that this isolated incident.  Otherwise, there have been no changes to her past medical history, surgical history, family history, or social history.  Review of systems otherwise negative other than that mentioned in the HPI.    Objective:   Blood pressure 124/74, pulse 60, temperature (!) 97.3 F (36.3 C), temperature source Temporal, resp. rate 18, height 5' 4.17 (1.63 m), weight 188 lb 4.8 oz (85.4 kg), SpO2 97%. Body mass index is 32.15 kg/m.    Physical Exam Constitutional:      Appearance: Normal appearance.  HENT:     Head: Normocephalic and atraumatic.     Ears:     Comments: Hearing grossly normal    Nose: Nose normal.     Mouth/Throat:     Mouth: Mucous membranes are moist.  Eyes:     Comments: Gross movements intact  Cardiovascular:     Rate  and Rhythm: Normal rate and regular rhythm.  Pulmonary:     Effort: Pulmonary effort is normal.     Breath sounds: Normal breath sounds.  Abdominal:     Palpations: Abdomen is soft.  Musculoskeletal:        General: Normal range of motion.     Cervical back: Normal range of motion.     Comments: Moves all four extremities spontaneously  Skin:    General: Skin is warm.  Neurological:     General: No focal deficit present.     Mental Status: She is alert.     Gait:  Gait normal.  Psychiatric:        Mood and Affect: Mood normal.        Behavior: Behavior normal.       Allergy  Studies: Today, Lamis passed an oral challenge of voltaren  75 mg starting with 1/4 pill and then 3/4 pill with no subsequent reaction or symptoms.    Allergy  testing results were read and interpreted by myself, documented by clinical staff.   Donnice Mutter, MS4 [facility]    Marty Shaggy, MD  [facility] 

## 2023-09-01 NOTE — Patient Instructions (Addendum)
 1. Anaphylaxis - The alpha gal test was definitely positive. - This is a type of allergy  that can be asymptomatic multiple times and then react the next time.  - EpiPen  training reviewed.   2. Allergy  to NSAIDs - Voltaren  challenge today showed that you do not have an allergy .  - I would put this back into your medication list if you would like.   3. Return in about 1 year (around 08/31/2024). You can have the follow up appointment with Dr. Iva or a Nurse Practicioner (our Nurse Practitioners are excellent and always have Physician oversight!).    Please inform us  of any Emergency Department visits, hospitalizations, or changes in symptoms. Call us  before going to the ED for breathing or allergy  symptoms since we might be able to fit you in for a sick visit. Feel free to contact us  anytime with any questions, problems, or concerns.  It was a pleasure to see you again today!  Websites that have reliable patient information: 1. American Academy of Asthma, Allergy , and Immunology: www.aaaai.org 2. Food Allergy  Research and Education (FARE): foodallergy.org 3. Mothers of Asthmatics: http://www.asthmacommunitynetwork.org 4. American College of Allergy , Asthma, and Immunology: www.acaai.org      "Like" us  on Facebook and Instagram for our latest updates!      A healthy democracy works best when [employer] participate! Make sure you are registered to vote! If you have moved or changed any of your contact information, you will need to get this updated before voting! Scan the QR codes below to learn more!     Alpha-gal and Red Meat Allergy    Overview An allergy  to "alpha-gal" refers to having a severe and potentially life-threatening allergy  to a carbohydrate molecule called galactose-alpha-1,3-galactose that is found in most mammalian or "red meat". Unlike other food allergies which typically occur within minutes of ingestion, symptoms from eating red meat such as pork, lamb or beef  may be delayed, occurring 3-8 hours after eating. Most food allergies are directed against a protein molecule, but alpha-gal is unusual because it is a carbohydrate, and a delay in its absorption may explain the delay in symptoms.  What are the symptoms of an alpha-gal allergy ? As with other food allergies, signs or symptoms of an allergy  to alpha-gal may include: Hives and itching  Swelling of your lips, face or eyelids  Shortness of breath, cough or wheezing  Abdominal pain, nausea, diarrhea or vomiting The most severe reaction, anaphylaxis, can present as a combination of several of these symptoms, may include low blood pressure, and is potentially fatal.  Because these symptoms are delayed, you may only wake up with them in the middle of the night after an evening meal.  How is an alpha-gal allergy  diagnosed? Diagnosis of this allergy  starts with your allergist taking an appropriate history and physical examination. Because the onset is usually quite delayed, it can be hard to associate the symptoms with eating red meat many hours previously. Triggers include any red meat - including beef, pork, lamb or even horse products. It may occur after eating hotdogs and hamburgers. In very rare cases the reaction may extend to milk or dairy proteins and gelatin.  Your allergist may recommend testing that includes skin tests to the relevant animal proteins and blood tests which measure the levels of a specific immunoglobulin E (IgE) antibody, to mammalian meats. An investigational blood test, IgE against alpha-gal itself, may also aid in the diagnosis.  How is an alpha-gal allergy  treated? Immediate symptoms such  as hives or shortness of breath are treated the same as any other food allergy  - in an urgent care setting with anti-histamines, epinephrine  and other medications. Prevention long-term involves avoidance of all red meat in sensitized individuals. You may be advised to carry an epinephrine   auto-injector, to be used in case of subsequent accidental exposures and reaction. These measures do not necessarily mean switching to a full vegetarian diet, since poultry and fish can be consumed and do not cause similar reactions. As with other food allergies, there is the possibility that over time the sensitivity diminishes - although these changes may take many years to become apparent.  How do you become allergic to alpha-gal? Alpha-gal is a molecule carried in the saliva of the [place] tick and other potential arthropods typically after feeding on mammalian blood. People that are bitten by the tick, especially those that are bitten repeatedly, are at risk of becoming sensitized and producing the IgE necessary to then cause allergic reactions. Interestingly, allergic reactions may occur to red meat, to subsequent tick bites, and even to medications that contain alpha-gal. Cetuximab is a cancer medication that contains alpha-gal, and people who have had allergic reactions to this medication (these are typically immediate reactions, because it is infused intravenously) have a higher risk for red meat allergy  and are likely to have been bitten by ticks in the past. As might be expected, the incidence of tick bites is much higher in the [place] the traditional habitat for the tick. However, cases are now increasingly reported in the [place] and [place]. And it is a phenomenon that has been observed worldwide, with different ticks responsible for similar cases of red meat allergy  in many other countries such as [place], [place] and [place].  The discovery of this peculiar allergy  has allowed researchers to correlate tick bites with many cases of anaphylaxis that would previously have been classified as 'idiopathic', or of unknown cause. Also, while it was originally thought that the [employer] tick had to feast on mammalian blood in order to carry the alpha-gal molecule, more  recent research has shown that it may carry this molecule and be capable of sensitizing humans independently.  How do you prevent an alpha-gal allergy ? Because this allergy  is predominantly tick born, you are more likely at risk if you often go outdoors in wooded areas for activities such as hiking, fishing or hunting. The key strategy is to prevent tick bites. This may include wearing long sleeved shirts or pants, using appropriate insect repellants, and surveying for ticks after spending time outdoors. Any observed ticks should be removed carefully by cleaning the site with rubbing alcohol, then using tweezers to pull the tick's head up carefully from the skin using steady pressure. Clean your hands and the site one more time and make sure not to crush the tick between your fingers.

## 2023-09-01 NOTE — Progress Notes (Deleted)
   FOLLOW UP  Date of Service/Encounter:  09/01/23   Assessment:   Anaphylaxis - trigger not entirely clear, but suggestive of an NSAID mediated process   Allergy  to NSAIDs  Plan/Recommendations:   There are no Patient Instructions on file for this visit.   Subjective:   Whitney Ross is a 68 y.o. female presenting today for follow up of No chief complaint on file.   Whitney Ross has a history of the following: Patient Active Problem List   Diagnosis Date Noted   Elevated coronary artery calcium  score 11/26/2022   RLS (restless legs syndrome) 06/20/2019   Excessive daytime sleepiness 06/20/2019   Pure hypercholesterolemia 07/19/2018   Anxiety 01/04/2018   Motion sickness 12/28/2016   Primary insomnia 06/24/2016   Genu valgus, congenital 03/06/2013   Arthritis of right shoulder region 11/03/2012   Overuse syndrome 11/03/2012   Tobacco use disorder 07/29/2012   Vitamin D deficiency 07/29/2012   HLD (hyperlipidemia) 07/29/2012   Gastroesophageal reflux disease without esophagitis 07/29/2012   HTN (hypertension) 07/29/2012   Purulent rhinitis 05/19/2012   Sinusitis, acute 05/19/2012   Stress at home 05/19/2012    History obtained from: chart review and {Persons; PED relatives w/patient:19415::patient}.  Discussed the use of AI scribe software for clinical note transcription with the patient and/or guardian, who gave verbal consent to proceed.  Whitney Ross is a 68 y.o. female presenting for {Blank single:19197::a food challenge,a drug challenge,skin testing,a sick visit,an evaluation of ***,a follow up visit}.  Asthma/Respiratory Symptom History: ***  Allergic Rhinitis Symptom History: ***  Food Allergy  Symptom History: ***  Skin Symptom History: ***  GERD Symptom History: ***  Infection Symptom History: ***  Otherwise, there have been no changes to her past medical history, surgical history, family history, or social history.    Review of  systems otherwise negative other than that mentioned in the HPI.    Objective:   There were no vitals taken for this visit. There is no height or weight on file to calculate BMI.    Physical Exam   Diagnostic studies: {Blank single:19197::none,deferred due to recent antihistamine use,deferred due to insurance stipulations that require a separate visit for testing,labs sent instead, }  Spirometry: {Blank single:19197::results normal (FEV1: ***%, FVC: ***%, FEV1/FVC: ***%),results abnormal (FEV1: ***%, FVC: ***%, FEV1/FVC: ***%)}.    {Blank single:19197::Spirometry consistent with mild obstructive disease,Spirometry consistent with moderate obstructive disease,Spirometry consistent with severe obstructive disease,Spirometry consistent with possible restrictive disease,Spirometry consistent with mixed obstructive and restrictive disease,Spirometry uninterpretable due to technique,Spirometry consistent with normal pattern}. {Blank single:19197::Albuterol /Atrovent nebulizer,Xopenex/Atrovent nebulizer,Albuterol  nebulizer,Albuterol  four puffs via MDI,Xopenex four puffs via MDI} treatment given in clinic with {Blank single:19197::significant improvement in FEV1 per ATS criteria,significant improvement in FVC per ATS criteria,significant improvement in FEV1 and FVC per ATS criteria,improvement in FEV1, but not significant per ATS criteria,improvement in FVC, but not significant per ATS criteria,improvement in FEV1 and FVC, but not significant per ATS criteria,no improvement}.  Allergy  Studies: {Blank single:19197::none,deferred due to recent antihistamine use,deferred due to insurance stipulations that require a separate visit for testing,labs sent instead, }    {Blank single:19197::Allergy  testing results were read and interpreted by myself, documented by clinical staff., }      Whitney Shaggy, MD  [facility]

## 2023-09-04 DIAGNOSIS — N200 Calculus of kidney: Secondary | ICD-10-CM | POA: Diagnosis not present

## 2023-09-04 DIAGNOSIS — F17210 Nicotine dependence, cigarettes, uncomplicated: Secondary | ICD-10-CM | POA: Diagnosis not present

## 2023-09-04 DIAGNOSIS — R932 Abnormal findings on diagnostic imaging of liver and biliary tract: Secondary | ICD-10-CM | POA: Diagnosis not present

## 2023-09-04 DIAGNOSIS — E785 Hyperlipidemia, unspecified: Secondary | ICD-10-CM | POA: Diagnosis not present

## 2023-09-04 DIAGNOSIS — R110 Nausea: Secondary | ICD-10-CM | POA: Diagnosis not present

## 2023-09-04 DIAGNOSIS — Z79899 Other long term (current) drug therapy: Secondary | ICD-10-CM | POA: Diagnosis not present

## 2023-09-04 DIAGNOSIS — Z90710 Acquired absence of both cervix and uterus: Secondary | ICD-10-CM | POA: Diagnosis not present

## 2023-09-04 DIAGNOSIS — R109 Unspecified abdominal pain: Secondary | ICD-10-CM | POA: Diagnosis not present

## 2023-09-04 DIAGNOSIS — I10 Essential (primary) hypertension: Secondary | ICD-10-CM | POA: Diagnosis not present

## 2023-09-04 DIAGNOSIS — R1030 Lower abdominal pain, unspecified: Secondary | ICD-10-CM | POA: Diagnosis not present

## 2023-10-19 ENCOUNTER — Ambulatory Visit

## 2023-11-04 ENCOUNTER — Encounter: Payer: Self-pay | Admitting: Internal Medicine

## 2023-12-01 ENCOUNTER — Ambulatory Visit

## 2023-12-01 VITALS — BP 124/74 | HR 60 | Ht 64.0 in | Wt 188.0 lb

## 2023-12-01 DIAGNOSIS — Z13820 Encounter for screening for osteoporosis: Secondary | ICD-10-CM

## 2023-12-01 DIAGNOSIS — Z0000 Encounter for general adult medical examination without abnormal findings: Secondary | ICD-10-CM | POA: Diagnosis not present

## 2023-12-01 NOTE — Patient Instructions (Signed)
 Ms. Whitney Ross,  Thank you for taking the time for your Medicare Wellness Visit. I appreciate your continued commitment to your health goals. Please review the care plan we discussed, and feel free to reach out if I can assist you further.  Medicare recommends these wellness visits once per year to help you and your care team stay ahead of potential health issues. These visits are designed to focus on prevention, allowing your provider to concentrate on managing your acute and chronic conditions during your regular appointments.  Please note that Annual Wellness Visits do not include a physical exam. Some assessments may be limited, especially if the visit was conducted virtually. If needed, we may recommend a separate in-person follow-up with your provider.  Ongoing Care Seeing your primary care provider every 3 to 6 months helps us  monitor your health and provide consistent, personalized care.   Referrals If a referral was made during today's visit and you haven't received any updates within two weeks, please contact the referred provider directly to check on the status.  Recommended Screenings:  Health Maintenance  Topic Date Due   COVID-19 Vaccine (1) Never done   Pneumococcal Vaccine for age over 25 (1 of 2 - PCV) Never done   Flu Shot  09/23/2023   DEXA scan (bone density measurement)  10/08/2023   Medicare Annual Wellness Visit  10/15/2023   Screening for Lung Cancer  04/18/2024*   DTaP/Tdap/Td vaccine (1 - Tdap) 04/18/2024*   Breast Cancer Screening  12/24/2024   Colon Cancer Screening  08/13/2026   Hepatitis C Screening  Completed   Zoster (Shingles) Vaccine  Completed   Meningitis B Vaccine  Aged Out  *Topic was postponed. The date shown is not the original due date.       12/01/2023   10:07 AM  Advanced Directives  Does Patient Have a Medical Advance Directive? No   Advance Care Planning is important because it: Ensures you receive medical care that aligns with your  values, goals, and preferences. Provides guidance to your family and loved ones, reducing the emotional burden of decision-making during critical moments.  Vision: Annual vision screenings are recommended for early detection of glaucoma, cataracts, and diabetic retinopathy. These exams can also reveal signs of chronic conditions such as diabetes and high blood pressure.  Dental: Annual dental screenings help detect early signs of oral cancer, gum disease, and other conditions linked to overall health, including heart disease and diabetes.  Please see the attached documents for additional preventive care recommendations.

## 2023-12-01 NOTE — Progress Notes (Signed)
 Subjective:   Whitney Ross is a 68 y.o. who presents for a Medicare Wellness preventive visit.  As a reminder, Annual Wellness Visits don't include a physical exam, and some assessments may be limited, especially if this visit is performed virtually. We may recommend an in-person follow-up visit with your provider if needed.  Visit Complete: Virtual I connected with  [name] on 12/01/23 by a audio and video enabled telemedicine application and verified that I am speaking with the correct person using two identifiers.  Patient Location: Home  Provider Location: Home Office  I discussed the limitations of evaluation and management by telemedicine. The patient expressed understanding and agreed to proceed.  Vital Signs: Because this visit was a virtual/telehealth visit, some criteria may be missing or patient reported. Any vitals not documented were not able to be obtained and vitals that have been documented are patient reported.  VideoDeclined- This patient declined Librarian, academic. Therefore the visit was completed with audio only.  Persons Participating in Visit: Patient.  AWV Questionnaire: No: Patient Medicare AWV questionnaire was not completed prior to this visit.  Cardiac Risk Factors include: advanced age (>44men, >72 women);obesity (BMI >30kg/m2);smoking/ tobacco exposure     Objective:    Today's Vitals   12/01/23 1015  BP: 124/74  Pulse: 60  Weight: 188 lb (85.3 kg)  Height: 5' 4 (1.626 m)   Body mass index is 32.27 kg/m.     12/01/2023   10:07 AM 10/15/2022   10:40 AM  Advanced Directives  Does Patient Have a Medical Advance Directive? No No  Would patient like information on creating a medical advance directive?  Yes (Inpatient - patient requests chaplain consult to create a medical advance directive)    Current Medications (verified) Outpatient Encounter Medications as of 12/01/2023  Medication Sig   albuterol   (VENTOLIN  HFA) 108 (90 Base) MCG/ACT inhaler Inhale 2 puffs into the lungs every 6 (six) hours as needed for wheezing or shortness of breath.   azelastine  (ASTELIN ) 0.1 % nasal spray Place 1 spray into both nostrils 2 (two) times daily.   Bempedoic Acid-Ezetimibe (NEXLIZET ) 180-10 MG TABS Take 1 tablet by mouth daily.   cetirizine  (ZYRTEC ) 10 MG tablet Take 1 tablet (10 mg total) by mouth daily.   diazepam  (VALIUM ) 5 MG tablet Take 0.5-1 tablets (2.5-5 mg total) by mouth daily as needed for anxiety.   EPINEPHrine  (EPIPEN  2-PAK) 0.3 mg/0.3 mL IJ SOAJ injection Inject 0.3 mg into the muscle as needed for anaphylaxis.   lisinopril  (ZESTRIL ) 30 MG tablet Take 1 tablet (30 mg total) by mouth daily. Taking at night   mometasone  (NASONEX ) 50 MCG/ACT nasal spray Place 2 sprays into the nose daily. REPLACE Flonase    nicotine  (NICODERM CQ  - DOSED IN MG/24 HOURS) 14 mg/24hr patch Place 1 patch (14 mg total) onto the skin daily.   pantoprazole  (PROTONIX ) 40 MG tablet Take 1 tablet (40 mg total) by mouth daily.   pravastatin  (PRAVACHOL ) 20 MG tablet Take 1 tablet (20 mg total) by mouth daily.   No facility-administered encounter medications on file as of 12/01/2023.    Allergies (verified) Diclofenac , Cortisporin [bacitra-neomycin-polymyxin-hc], and Statins   History: Past Medical History:  Diagnosis Date   Chronic kidney disease    hx muscle stones   Diverticulitis    Elevated cholesterol    GERD (gastroesophageal reflux disease)    Heart murmur    Hypertension    Lumbago    Neck pain  Nephrolithiasis    Past Surgical History:  Procedure Laterality Date   BELPHAROPTOSIS REPAIR Bilateral 06/24/2021   Dr [place]   CESAREAN SECTION  1987   CYSTOSCOPY     KNEE ARTHROSCOPY W/ MENISCAL REPAIR Left    RECONSTRUCTION OF EYELID     VAGINAL HYSTERECTOMY  1997   TOTAL VAGINAL   Family History  Problem Relation Age of Onset   Heart failure Mother    Melanoma Father    Hypertension Sister     Hyperlipidemia Sister    Hyperlipidemia Brother    Hypertension Brother    Asthma Son    Allergic rhinitis Son    Colon cancer Neg Hx    Esophageal cancer Neg Hx    Stomach cancer Neg Hx    Rectal cancer Neg Hx    Social History   Socioeconomic History   Marital status: Married    Spouse name: Elspeth   Number of children: 1   Years of education: Not on file   Highest education level: Not on file  Occupational History   Occupation: IT sales professional: [employer]  Tobacco Use   Smoking status: Every Day    Current packs/day: 0.50    Average packs/day: 1 pack/day for 46.8 years (44.9 ttl pk-yrs)    Types: Cigarettes    Start date: 1979    Passive exposure: Current   Smokeless tobacco: Never  Vaping Use   Vaping status: Never Used  Substance and Sexual Activity   Alcohol use: Not Currently    Comment: RARELY   Drug use: No   Sexual activity: Not Currently  Other Topics Concern   Not on file  Social History Narrative   She is not sure about what her CODE STATUS is and does not have an advanced directive.  She does report that she wants her son to be the decision maker if there is a emergency.  Does not want decisions to be deferred to her husband Elspeth.   Social Drivers of Corporate investment banker Strain: Low Risk  (12/01/2023)   Overall Financial Resource Strain (CARDIA)    Difficulty of Paying Living Expenses: Not hard at all  Food Insecurity: No Food Insecurity (12/01/2023)   Hunger Vital Sign    Worried About Running Out of Food in the Last Year: Never true    Ran Out of Food in the Last Year: Never true  Transportation Needs: No Transportation Needs (12/01/2023)   PRAPARE - Administrator, Civil Service (Medical): No    Lack of Transportation (Non-Medical): No  Physical Activity: Inactive (12/01/2023)   Exercise Vital Sign    Days of Exercise per Week: 0 days    Minutes of Exercise per Session: 0 min  Stress: No Stress Concern  Present (12/01/2023)   [employer] of Occupational Health - Occupational Stress Questionnaire    Feeling of Stress: Not at all  Social Connections: Socially Integrated (12/01/2023)   Social Connection and Isolation Panel    Frequency of Communication with Friends and Family: Twice a week    Frequency of Social Gatherings with Friends and Family: Three times a week    Attends Religious Services: 1 to 4 times per year    Active Member of Clubs or Organizations: Yes    Attends Banker Meetings: 1 to 4 times per year    Marital Status: Married    Tobacco Counseling Ready to quit: Not Answered Counseling given: Yes  Clinical Intake:  Pre-visit preparation completed: Yes  Pain : No/denies pain     BMI - recorded: 32.27 Nutritional Status: BMI > 30  Obese Nutritional Risks: None Diabetes: No  Lab Results  Component Value Date   HGBA1C 5.5 10/15/2022   HGBA1C 5.7 (H) 10/07/2021     How often do you need to have someone help you when you read instructions, pamphlets, or other written materials from your doctor or pharmacy?: 1 - Never  Interpreter Needed?: No  Information entered by :: alia t/cma   Activities of Daily Living     12/01/2023   10:04 AM  In your present state of health, do you have any difficulty performing the following activities:  Hearing? 0  Vision? 0  Difficulty concentrating or making decisions? 0  Walking or climbing stairs? 0  Dressing or bathing? 0  Doing errands, shopping? 0  Preparing Food and eating ? N  Using the Toilet? N  In the past six months, have you accidently leaked urine? Y  Do you have problems with loss of bowel control? Y  Managing your Medications? N  Managing your Finances? N  Housekeeping or managing your Housekeeping? N    Patient Care Team: Jolinda Norene HERO, DO as PCP - General (Family Medicine) Dann Candyce RAMAN, MD as PCP - Cardiology (Cardiology) Teressa Toribio SQUIBB, MD (Inactive) as  Attending Physician (Gastroenterology) Dann Candyce RAMAN, MD as Consulting Physician (Cardiology)  I have updated your Care Teams any recent Medical Services you may have received from other providers in the past year.     Assessment:   This is a routine wellness examination for [facility].  Hearing/Vision screen Hearing Screening - Comments:: Pt having hear loss in r-ear Vision Screening - Comments:: Pt wear glasses/pt goes to [facility] in [place]/last ov 2025   Goals Addressed             This Visit's Progress    Exercise 150 min/wk Moderate Activity   On track      Depression Screen     12/01/2023   10:08 AM 05/24/2023    1:57 PM 05/19/2023   10:37 AM 04/19/2023    8:23 AM 10/15/2022   10:41 AM 08/05/2022    8:26 AM 08/04/2022    9:07 AM  PHQ 2/9 Scores  PHQ - 2 Score 0 0 0 0 0 0 0  PHQ- 9 Score  0  0  0 0    Fall Risk     12/01/2023    9:59 AM 05/24/2023    1:57 PM 05/19/2023   10:37 AM 04/19/2023    8:23 AM 10/15/2022   10:44 AM  Fall Risk   Falls in the past year? 0 1 1 0 0  Number falls in past yr: 0 0 0 0 0  Injury with Fall? 0 1 1 0 0  Risk for fall due to : Impaired balance/gait;Impaired mobility History of fall(s) History of fall(s) No Fall Risks No Fall Risks  Follow up Falls evaluation completed;Education provided Education provided Education provided Falls evaluation completed Education provided    MEDICARE RISK AT HOME:  Medicare Risk at Home Any stairs in or around the home?: Yes If so, are there any without handrails?: Yes Home free of loose throw rugs in walkways, pet beds, electrical cords, etc?: Yes Adequate lighting in your home to reduce risk of falls?: Yes Life alert?: No Use of a cane, walker or w/c?: No Grab bars in the bathroom?: Yes  Shower chair or bench in shower?: No Elevated toilet seat or a handicapped toilet?: No  TIMED UP AND GO:  Was the test performed?  no  Cognitive Function: 6CIT completed    10/15/2022   10:42 AM  MMSE -  Mini Mental State Exam  Orientation to time 5  Orientation to Place 5  Registration 3  Attention/ Calculation 5  Recall 3  Language- name 2 objects 2  Language- repeat 1  Language- follow 3 step command 3  Language- read & follow direction 1  Write a sentence 1  Copy design 1  Total score 30        12/01/2023   10:15 AM 10/15/2022   10:42 AM  6CIT Screen  What Year? 0 points 0 points  What month? 0 points 0 points  What time? 0 points 0 points  Count back from 20 0 points 0 points  Months in reverse 0 points 0 points  Repeat phrase 0 points 0 points  Total Score 0 points 0 points    Immunizations Immunization History  Administered Date(s) Administered   Fluad Trivalent(High Dose 65+) 04/19/2023   Influenza Split 03/06/2011   Zoster Recombinant(Shingrix ) 10/15/2022, 02/14/2023    Screening Tests Health Maintenance  Topic Date Due   COVID-19 Vaccine (1) Never done   Pneumococcal Vaccine: 50+ Years (1 of 2 - PCV) Never done   Influenza Vaccine  09/23/2023   DEXA SCAN  10/08/2023   Lung Cancer Screening  04/18/2024 (Originally 07/20/2007)   DTaP/Tdap/Td (1 - Tdap) 04/18/2024 (Originally 07/19/1976)   Medicare Annual Wellness (AWV)  11/30/2024   Mammogram  12/24/2024   Colonoscopy  08/13/2026   Hepatitis C Screening  Completed   Zoster Vaccines- Shingrix   Completed   Meningococcal B Vaccine  Aged Out    Health Maintenance Items Addressed: DEXA ordered  Additional Screening:  Vision Screening: Recommended annual ophthalmology exams for early detection of glaucoma and other disorders of the eye. Is the patient up to date with their annual eye exam?  Yes  Who is the provider or what is the name of the office in which the patient attends annual eye exams? [facility] In [facility]  Dental Screening: Recommended annual dental exams for proper oral hygiene  Community Resource Referral / Chronic Care Management: CRR required this visit?  No   CCM required this visit?   No   Plan:    I have personally reviewed and noted the following in the patient's chart:   Medical and social history Use of alcohol, tobacco or illicit drugs  Current medications and supplements including opioid prescriptions. Patient is not currently taking opioid prescriptions. Functional ability and status Nutritional status Physical activity Advanced directives List of other physicians Hospitalizations, surgeries, and ER visits in previous 12 months Vitals Screenings to include cognitive, depression, and falls Referrals and appointments  In addition, I have reviewed and discussed with patient certain preventive protocols, quality metrics, and best practice recommendations. A written personalized care plan for preventive services as well as general preventive health recommendations were provided to patient.   Ozie Ned, CMA   12/01/2023   After Visit Summary: (MyChart) Due to this being a telephonic visit, the after visit summary with patients personalized plan was offered to patient via MyChart   Notes: Nothing significant to report at this time.

## 2023-12-02 ENCOUNTER — Ambulatory Visit: Admitting: Family Medicine

## 2023-12-08 DIAGNOSIS — R0981 Nasal congestion: Secondary | ICD-10-CM | POA: Diagnosis not present

## 2023-12-08 DIAGNOSIS — B349 Viral infection, unspecified: Secondary | ICD-10-CM | POA: Diagnosis not present

## 2023-12-27 ENCOUNTER — Ambulatory Visit

## 2024-01-04 ENCOUNTER — Ambulatory Visit

## 2024-01-04 DIAGNOSIS — Z13820 Encounter for screening for osteoporosis: Secondary | ICD-10-CM

## 2024-01-04 DIAGNOSIS — Z780 Asymptomatic menopausal state: Secondary | ICD-10-CM

## 2024-01-04 DIAGNOSIS — Z0000 Encounter for general adult medical examination without abnormal findings: Secondary | ICD-10-CM

## 2024-01-06 DIAGNOSIS — Z780 Asymptomatic menopausal state: Secondary | ICD-10-CM | POA: Diagnosis not present

## 2024-01-09 DIAGNOSIS — Z1231 Encounter for screening mammogram for malignant neoplasm of breast: Secondary | ICD-10-CM | POA: Diagnosis not present

## 2024-01-09 LAB — HM MAMMOGRAPHY

## 2024-01-10 ENCOUNTER — Encounter: Payer: Self-pay | Admitting: Family Medicine

## 2024-01-10 ENCOUNTER — Ambulatory Visit: Payer: Self-pay | Admitting: Family Medicine

## 2024-02-17 NOTE — Progress Notes (Signed)
 Whitney Ross                                          MRN: [id number]   02/17/2024   The [facility]I Quality Team Specialist reviewed this patient medical record for the purposes of chart review for care gap closure. The following were reviewed: chart review for care gap closure-controlling blood pressure.    VBCI Quality Team

## 2024-03-06 ENCOUNTER — Ambulatory Visit (INDEPENDENT_AMBULATORY_CARE_PROVIDER_SITE_OTHER): Admitting: Family Medicine

## 2024-03-06 ENCOUNTER — Encounter: Payer: Self-pay | Admitting: Family Medicine

## 2024-03-06 ENCOUNTER — Ambulatory Visit (HOSPITAL_BASED_OUTPATIENT_CLINIC_OR_DEPARTMENT_OTHER)
Admission: RE | Admit: 2024-03-06 | Discharge: 2024-03-06 | Disposition: A | Discharge status: Home / Self Care | Source: Ambulatory Visit | Attending: Family Medicine | Admitting: Family Medicine

## 2024-03-06 VITALS — BP 168/82 | HR 67 | Temp 97.6°F | Ht 65.0 in | Wt 183.0 lb

## 2024-03-06 DIAGNOSIS — R197 Diarrhea, unspecified: Secondary | ICD-10-CM | POA: Diagnosis not present

## 2024-03-06 DIAGNOSIS — R1031 Right lower quadrant pain: Secondary | ICD-10-CM | POA: Diagnosis not present

## 2024-03-06 DIAGNOSIS — I10 Essential (primary) hypertension: Secondary | ICD-10-CM | POA: Diagnosis not present

## 2024-03-06 MED ORDER — IOHEXOL 300 MG/ML  SOLN
100.0000 mL | Freq: Once | INTRAMUSCULAR | Status: AC | PRN
  Administered 2024-03-06: 100 mL via INTRAVENOUS

## 2024-03-06 NOTE — Progress Notes (Signed)
 "  Subjective: CC: Follow-up hypertension PCP: Whitney Ross, Whitney Ross YEP:Whitney Ross is a 69 y.o. female presenting to clinic today for:  Patient reports she thinks her blood pressure is elevated because she is in pain.  She has had a 2-day history of lower abdominal pain with associated loose stools.  Denies any hematochezia or melena.  No associated fevers, nausea or vomiting.  This feels like diverticulitis to her.   ROS: Per HPI  Allergies[1] Past Medical History:  Diagnosis Date   Chronic kidney disease    hx muscle stones   Diverticulitis    Elevated cholesterol    GERD (gastroesophageal reflux disease)    Heart murmur    Hypertension    Lumbago    Neck pain    Nephrolithiasis    Current Medications[2] Social History   Socioeconomic History   Marital status: Married    Spouse name: Whitney Ross   Number of children: 1   Years of education: Not on file   Highest education level: Not on file  Occupational History   Occupation: It Sales Professional: [employer]  Tobacco Use   Smoking status: Every Day    Current packs/day: 0.50    Average packs/day: 1 pack/day for 47.0 years (45.0 ttl pk-yrs)    Types: Cigarettes    Start date: 1979    Passive exposure: Current   Smokeless tobacco: Never  Vaping Use   Vaping status: Never Used  Substance and Sexual Activity   Alcohol use: Not Currently    Comment: RARELY   Drug use: No   Sexual activity: Not Currently  Other Topics Concern   Not on file  Social History Narrative   She is not sure about what her CODE STATUS is and does not have an advanced directive.  She does report that she wants her son to be the decision maker if there is a emergency.  Does not want decisions to be deferred to her husband Whitney Ross.   Social Drivers of Health   Tobacco Use: High Risk (12/08/2023)   Received from [facility]   Patient History    Smoking Tobacco Use: Every Day    Smokeless Tobacco Use: Never    Passive  Exposure: Current  Financial Resource Strain: Low Risk (12/01/2023)   Overall Financial Resource Strain (CARDIA)    Difficulty of Paying Living Expenses: Not hard at all  Food Insecurity: No Food Insecurity (12/01/2023)   Epic    Worried About Programme Researcher, Broadcasting/film/video in the Last Year: Never true    Ran Out of Food in the Last Year: Never true  Transportation Needs: No Transportation Needs (12/01/2023)   Epic    Lack of Transportation (Medical): No    Lack of Transportation (Non-Medical): No  Physical Activity: Inactive (12/01/2023)   Exercise Vital Sign    Days of Exercise per Week: 0 days    Minutes of Exercise per Session: 0 min  Stress: No Stress Concern Present (12/01/2023)   [employer] of Occupational Health - Occupational Stress Questionnaire    Feeling of Stress: Not at all  Social Connections: Socially Integrated (12/01/2023)   Social Connection and Isolation Panel    Frequency of Communication with Friends and Family: Twice a week    Frequency of Social Gatherings with Friends and Family: Three times a week    Attends Religious Services: 1 to 4 times per year    Active Member of Clubs or Organizations: Yes  Attends Banker Meetings: 1 to 4 times per year    Marital Status: Married  Catering Manager Violence: Not At Risk (12/01/2023)   Epic    Fear of Current or Ex-Partner: No    Emotionally Abused: No    Physically Abused: No    Sexually Abused: No  Depression (PHQ2-9): Low Risk (12/01/2023)   Depression (PHQ2-9)    PHQ-2 Score: 0  Alcohol Screen: Low Risk (12/01/2023)   Alcohol Screen    Last Alcohol Screening Score (AUDIT): 0  Housing: Low Risk (12/01/2023)   Epic    Unable to Pay for Housing in the Last Year: No    Number of Times Moved in the Last Year: 0    Homeless in the Last Year: No  Utilities: Not At Risk (12/01/2023)   Epic    Threatened with loss of utilities: No  Health Literacy: Adequate Health Literacy (12/01/2023)   B1300 Health Literacy     Frequency of need for help with medical instructions: Never   Family History  Problem Relation Age of Onset   Heart failure Mother    Melanoma Father    Hypertension Sister    Hyperlipidemia Sister    Hyperlipidemia Brother    Hypertension Brother    Asthma Son    Allergic rhinitis Son    Colon cancer Neg Hx    Esophageal cancer Neg Hx    Stomach cancer Neg Hx    Rectal cancer Neg Hx     Objective: Office vital signs reviewed. BP (!) 168/82   Pulse 67   Temp 97.6 F (36.4 C)   Ht 5' 5 (1.651 m)   Wt 183 lb (83 kg)   SpO2 98%   BMI 30.45 kg/m   Physical Examination:  General: Awake, alert, nontoxic female, No acute distress HEENT: sclera white, MMM Cardio: regular rate and rhythm  GI: soft, generalized tenderness of patient in the lower abdomen with predominance on the right lower quadrant.  Slight guarding but no rebound.  Non-distended, bowel sounds present x4    Assessment/ Plan: 69 y.o. female   Right lower quadrant abdominal pain - Plan: CT ABDOMEN PELVIS W CONTRAST  Diarrhea of presumed infectious origin - Plan: CT ABDOMEN PELVIS W CONTRAST  Essential hypertension   Concerns for acute diverticulitis versus appendicitis.  Stat CT abdomen pelvis with contrast ordered.  Further interventions pending results.  Blood pressure not controlled but again I think this is due to acute abdominal pain.   Whitney Ross, Whitney Ross [facility] [phone]     [1]  Allergies Allergen Reactions   Diclofenac  Shortness Of Breath, Diarrhea and Dermatitis   Cortisporin [Bacitra-Neomycin-Polymyxin-Hc]    Statins     Joint pain on atorvastatin  20-40mg  daily, rosuvastatin, and simvastatin 20mg  daily  [2]  Current Outpatient Medications:    albuterol  (VENTOLIN  HFA) 108 (90 Base) MCG/ACT inhaler, Inhale 2 puffs into the lungs every 6 (six) hours as needed for wheezing or shortness of breath., Disp: 8.5 g, Rfl: 5   azelastine  (ASTELIN ) 0.1 % nasal  spray, Place 1 spray into both nostrils 2 (two) times daily., Disp: 30 mL, Rfl: 12   Bempedoic Acid-Ezetimibe (NEXLIZET ) 180-10 MG TABS, Take 1 tablet by mouth daily., Disp: 30 tablet, Rfl: 11   cetirizine  (ZYRTEC ) 10 MG tablet, Take 1 tablet (10 mg total) by mouth daily., Disp: 90 tablet, Rfl: 2   diazepam  (VALIUM ) 5 MG tablet, Take 0.5-1 tablets (2.5-5 mg total) by mouth daily  as needed for anxiety., Disp: 30 tablet, Rfl: 5   EPINEPHrine  (EPIPEN  2-PAK) 0.3 mg/0.3 mL IJ SOAJ injection, Inject 0.3 mg into the muscle as needed for anaphylaxis., Disp: 1 each, Rfl: 0   lisinopril  (ZESTRIL ) 30 MG tablet, Take 1 tablet (30 mg total) by mouth daily. Taking at night, Disp: 90 tablet, Rfl: 3   mometasone  (NASONEX ) 50 MCG/ACT nasal spray, Place 2 sprays into the nose daily. REPLACE Flonase , Disp: 1 each, Rfl: 12   nicotine  (NICODERM CQ  - DOSED IN MG/24 HOURS) 14 mg/24hr patch, Place 1 patch (14 mg total) onto the skin daily., Disp: 28 patch, Rfl: 2   pantoprazole  (PROTONIX ) 40 MG tablet, Take 1 tablet (40 mg total) by mouth daily., Disp: 90 tablet, Rfl: 3   pravastatin  (PRAVACHOL ) 20 MG tablet, Take 1 tablet (20 mg total) by mouth daily., Disp: 90 tablet, Rfl: 3  "

## 2024-03-09 ENCOUNTER — Ambulatory Visit: Payer: Self-pay | Admitting: Family Medicine

## 2024-03-09 ENCOUNTER — Encounter: Payer: Self-pay | Admitting: *Deleted

## 2024-03-09 DIAGNOSIS — K5732 Diverticulitis of large intestine without perforation or abscess without bleeding: Secondary | ICD-10-CM

## 2024-03-09 MED ORDER — AMOXICILLIN-POT CLAVULANATE 875-125 MG PO TABS: 1.0000 | ORAL_TABLET | Freq: Two times a day (BID) | ORAL | 0 refills | Status: AC

## 2024-03-11 NOTE — Progress Notes (Unsigned)
 " Cardiology Office Note:  .   Date:  03/12/2024  ID:  ANI DEOLIVEIRA, DOB 1955/07/31, MRN [id number] PCP: Jolinda Norene HERO, DO  [facility] Providers Cardiologist:  Candyce Reek, MD    History of Present Illness: .     Discussed the use of AI scribe software for clinical note transcription with the patient, who gave verbal consent to proceed.  History of Present Illness Whitney Ross is a 69 year old female who presents for an annual follow-up.  She has a history of hypertension, generally well-controlled with lisinopril  30 mg daily. Her blood pressure can become elevated during periods of diverticulitis flare which she has been experiencing recently.   She has hyperlipidemia and is currently taking pravastatin , but ran out of her bempedoic acid-ezetimibe and has not been taking this. Her most recent lipid panel from 2024 showed an LDL of 66, VLDL of 14, triglycerides of 70, HDL of 66, and total cholesterol of 146. Her calcium  score is 57, in the 75th percentile, with specific scores of 43 in the left main, 2 in the LAD, 0 in the left circumflex, and 12 in the right coronary artery. Aortic atherosclerosis is also noted.  She smokes about a pack a day and wants to reduce her smoking. She acknowledges the health risks associated with smoking, particularly concerning her heart health.  No current chest pain, shortness of breath, or swelling in the legs.      ROS: Remaining review of systems negative  Studies Reviewed: SABRA   EKG Interpretation Date/Time:  Monday March 12 2024 09:14:43 EST Ventricular Rate:  62 PR Interval:  168 QRS Duration:  88 QT Interval:  414 QTC Calculation: 420 R Axis:   81  Text Interpretation: Normal sinus rhythm Low voltage QRS Cannot rule out Anterior infarct , age undetermined Abnormal ECG When compared with ECG of 10/15/22 No significant change since Confirmed by Kriste Hicks [phone]) on 03/12/2024 9:17:53 AM    Results Labs Lipid  panel (02/14/2023): LDL 66, VLDL 14, triglycerides 70, HDL 66, total cholesterol 853  Radiology Coronary artery calcium  score (12/05/2022): Total score 57 (75th percentile); left main 43, left anterior descending 2, left circumflex 0, right coronary artery 12; aortic atherosclerosis present Risk Assessment/Calculations:             Physical Exam:   VS:  BP 139/80 (BP Location: Right Arm, Patient Position: Sitting, Cuff Size: Large)   Pulse 65   Ht 5' 5 (1.651 m)   Wt 184 lb 3.2 oz (83.6 kg)   SpO2 95%   BMI 30.65 kg/m    Wt Readings from Last 3 Encounters:  03/12/24 184 lb 3.2 oz (83.6 kg)  03/06/24 183 lb (83 kg)  12/01/23 188 lb (85.3 kg)    GEN: Well nourished, well developed in no acute distress NECK: No JVD; No carotid bruits CARDIAC:  RRR, no murmurs, no rubs, no gallops RESPIRATORY:  Clear to auscultation without rales, wheezing or rhonchi  ABDOMEN: Soft, non-tender, non-distended EXTREMITIES:  No edema; No deformity   ASSESSMENT AND PLAN: .    Assessment and Plan Assessment & Plan Essential hypertension Hypertension generally well-controlled with occasional flare-ups, likely due to diverticulitis. - Continue lisinopril  30 mg daily. - goal BP < 130/80  Hyperlipidemia Managed with bempedoic acid, ezetimibe, and pravastatin . Last lipid panel in 2024 showed LDL 66, VLDL 14, triglycerides 70, HDL 66, total cholesterol 853. - Ordered lipid panel today, has been fasting.  Atherosclerosis of aorta Aortic  atherosclerosis noted on recent imaging. No acute symptoms. - Continue current management and monitoring.  Nicotine  dependence, cigarettes Smokes one pack per day. Acknowledged health risks and expressed desire to quit. - Encouraged reduction to half a pack per day. - Discussed smoking cessation strategies.  Diverticulitis Managed by PCP            Follow up: 1 year  Signed, Emeline FORBES Calender, DO  03/12/2024 9:28 AM    [facility] "

## 2024-03-12 ENCOUNTER — Ambulatory Visit: Attending: Internal Medicine | Admitting: Internal Medicine

## 2024-03-12 ENCOUNTER — Encounter: Payer: Self-pay | Admitting: Internal Medicine

## 2024-03-12 VITALS — BP 139/80 | HR 65 | Ht 65.0 in | Wt 184.2 lb

## 2024-03-12 DIAGNOSIS — R931 Abnormal findings on diagnostic imaging of heart and coronary circulation: Secondary | ICD-10-CM | POA: Diagnosis not present

## 2024-03-12 DIAGNOSIS — I10 Essential (primary) hypertension: Secondary | ICD-10-CM

## 2024-03-12 DIAGNOSIS — E785 Hyperlipidemia, unspecified: Secondary | ICD-10-CM | POA: Diagnosis not present

## 2024-03-12 LAB — LIPID PANEL
Chol/HDL Ratio: 3.4 ratio (ref 0.0–4.4)
Cholesterol, Total: 201 mg/dL — ABNORMAL HIGH (ref 100–199)
HDL: 59 mg/dL
LDL Chol Calc (NIH): 127 mg/dL — ABNORMAL HIGH (ref 0–99)
Triglycerides: 86 mg/dL (ref 0–149)
VLDL Cholesterol Cal: 15 mg/dL (ref 5–40)

## 2024-03-12 NOTE — Patient Instructions (Signed)
 Medication Instructions:  Your physician recommends that you continue on your current medications as directed. Please refer to the Current Medication list given to you today.  *If you need a refill on your cardiac medications before your next appointment, please call your pharmacy*  Lab Work: Fasting Lipid Panel at [facility]   If you have labs (blood work) drawn today and your tests are completely normal, you will receive your results only by: MyChart Message (if you have MyChart) OR A paper copy in the mail If you have any lab test that is abnormal or we need to change your treatment, we will call you to review the results.  Testing/Procedures: NONE   Follow-Up: At [facility], you and your health needs are our priority.  As part of our continuing mission to provide you with exceptional heart care, our providers are all part of one team.  This team includes your primary Cardiologist (physician) and Advanced Practice Providers or APPs (Physician Assistants and Nurse Practitioners) who all work together to provide you with the care you need, when you need it.  Your next appointment:   1 year(s)  Provider:   Emeline FORBES Calender, DO     Other Instructions

## 2024-03-13 ENCOUNTER — Ambulatory Visit: Payer: Self-pay | Admitting: Internal Medicine

## 2024-03-13 NOTE — Progress Notes (Signed)
 The 10-year ASCVD risk score (Arnett DK, et al., 2019) is: 16.6%   Values used to calculate the score:     Age: 69 years     Clinically relevant sex: Female     Is Non-Hispanic African American: No     Diabetic: No     Tobacco smoker: Yes     Systolic Blood Pressure: 139 mmHg     Is BP treated: Yes     HDL Cholesterol: 59 mg/dL     Total Cholesterol: 201 mg/dL   Has intolerance to several statins, currently on pravastatin  20 mg.   Would advise increasing to 40 mg daily and continuing her other therapies.   Thank you, Emeline FORBES Calender, DO

## 2024-03-14 MED ORDER — PRAVASTATIN SODIUM 40 MG PO TABS: 40.0000 mg | ORAL_TABLET | Freq: Every evening | ORAL | 3 refills | Status: AC

## 2024-03-21 NOTE — Progress Notes (Signed)
 Whitney Ross                                          MRN: [id number]   03/21/2024   The [facility] Quality Team Specialist reviewed this patient medical record for the purposes of chart review for care gap closure. The following were reviewed: chart review for care gap closure-controlling blood pressure.    [facility] Quality Team

## 2024-09-04 ENCOUNTER — Ambulatory Visit: Admitting: Allergy & Immunology

## 2024-09-12 ENCOUNTER — Ambulatory Visit: Admitting: Family Medicine

## 2024-12-03 ENCOUNTER — Ambulatory Visit: Payer: Self-pay
